# Patient Record
Sex: Male | Born: 1960 | Race: Black or African American | Hispanic: No | Smoking: Never smoker
Health system: Southern US, Community
[De-identification: ages and names within clinical notes are randomized; demographics above are authoritative.]

## PROBLEM LIST (undated history)

## (undated) DIAGNOSIS — R5383 Other fatigue: Secondary | ICD-10-CM

## (undated) DIAGNOSIS — F329 Major depressive disorder, single episode, unspecified: Secondary | ICD-10-CM

## (undated) DIAGNOSIS — I10 Essential (primary) hypertension: Secondary | ICD-10-CM

## (undated) DIAGNOSIS — H409 Unspecified glaucoma: Secondary | ICD-10-CM

## (undated) DIAGNOSIS — Z5189 Encounter for other specified aftercare: Secondary | ICD-10-CM

## (undated) DIAGNOSIS — K219 Gastro-esophageal reflux disease without esophagitis: Secondary | ICD-10-CM

## (undated) DIAGNOSIS — R519 Headache, unspecified: Secondary | ICD-10-CM

## (undated) DIAGNOSIS — F319 Bipolar disorder, unspecified: Secondary | ICD-10-CM

## (undated) DIAGNOSIS — M1990 Unspecified osteoarthritis, unspecified site: Secondary | ICD-10-CM

## (undated) DIAGNOSIS — T7840XA Allergy, unspecified, initial encounter: Secondary | ICD-10-CM

## (undated) DIAGNOSIS — D126 Benign neoplasm of colon, unspecified: Secondary | ICD-10-CM

## (undated) DIAGNOSIS — R631 Polydipsia: Secondary | ICD-10-CM

## (undated) DIAGNOSIS — R002 Palpitations: Secondary | ICD-10-CM

## (undated) DIAGNOSIS — R011 Cardiac murmur, unspecified: Secondary | ICD-10-CM

## (undated) DIAGNOSIS — R0602 Shortness of breath: Secondary | ICD-10-CM

## (undated) DIAGNOSIS — E069 Thyroiditis, unspecified: Secondary | ICD-10-CM

## (undated) DIAGNOSIS — E041 Nontoxic single thyroid nodule: Secondary | ICD-10-CM

## (undated) DIAGNOSIS — M79609 Pain in unspecified limb: Secondary | ICD-10-CM

## (undated) DIAGNOSIS — R490 Dysphonia: Secondary | ICD-10-CM

## (undated) DIAGNOSIS — K649 Unspecified hemorrhoids: Secondary | ICD-10-CM

## (undated) DIAGNOSIS — F419 Anxiety disorder, unspecified: Secondary | ICD-10-CM

## (undated) DIAGNOSIS — M542 Cervicalgia: Secondary | ICD-10-CM

## (undated) DIAGNOSIS — IMO0001 Reserved for inherently not codable concepts without codable children: Secondary | ICD-10-CM

## (undated) DIAGNOSIS — R5381 Other malaise: Secondary | ICD-10-CM

## (undated) DIAGNOSIS — R51 Headache: Secondary | ICD-10-CM

## (undated) DIAGNOSIS — R079 Chest pain, unspecified: Secondary | ICD-10-CM

## (undated) DIAGNOSIS — E7800 Pure hypercholesterolemia, unspecified: Secondary | ICD-10-CM

## (undated) HISTORY — DX: Other malaise: R53.81

## (undated) HISTORY — DX: Unspecified glaucoma: H40.9

## (undated) HISTORY — PX: BACK SURGERY: SHX140

## (undated) HISTORY — DX: Polydipsia: R63.1

## (undated) HISTORY — DX: Reserved for inherently not codable concepts without codable children: IMO0001

## (undated) HISTORY — DX: Shortness of breath: R06.02

## (undated) HISTORY — DX: Gastro-esophageal reflux disease without esophagitis: K21.9

## (undated) HISTORY — DX: Other fatigue: R53.83

## (undated) HISTORY — DX: Thyroiditis, unspecified: E06.9

## (undated) HISTORY — DX: Cardiac murmur, unspecified: R01.1

## (undated) HISTORY — DX: Nontoxic single thyroid nodule: E04.1

## (undated) HISTORY — DX: Essential (primary) hypertension: I10

## (undated) HISTORY — DX: Unspecified osteoarthritis, unspecified site: M19.90

## (undated) HISTORY — DX: Pain in unspecified limb: M79.609

## (undated) HISTORY — PX: EYE SURGERY: SHX253

## (undated) HISTORY — DX: Allergy, unspecified, initial encounter: T78.40XA

## (undated) HISTORY — DX: Anxiety disorder, unspecified: F41.9

## (undated) HISTORY — DX: Encounter for other specified aftercare: Z51.89

## (undated) HISTORY — DX: Benign neoplasm of colon, unspecified: D12.6

## (undated) HISTORY — DX: Bipolar disorder, unspecified: F31.9

## (undated) HISTORY — DX: Palpitations: R00.2

## (undated) HISTORY — PX: SPINE SURGERY: SHX786

## (undated) HISTORY — DX: Cervicalgia: M54.2

## (undated) HISTORY — DX: Pure hypercholesterolemia, unspecified: E78.00

## (undated) HISTORY — DX: Dysphonia: R49.0

## (undated) HISTORY — PX: FINGER SURGERY: SHX640

## (undated) HISTORY — DX: Unspecified hemorrhoids: K64.9

## (undated) HISTORY — DX: Chest pain, unspecified: R07.9

## (undated) HISTORY — DX: Major depressive disorder, single episode, unspecified: F32.9

---

## 1999-11-20 ENCOUNTER — Ambulatory Visit (HOSPITAL_BASED_OUTPATIENT_CLINIC_OR_DEPARTMENT_OTHER): Admission: RE | Admit: 1999-11-20 | Discharge: 1999-11-20 | Payer: Self-pay | Admitting: Orthopedic Surgery

## 2003-03-01 ENCOUNTER — Encounter: Payer: Self-pay | Admitting: Endocrinology

## 2003-03-01 ENCOUNTER — Ambulatory Visit (HOSPITAL_COMMUNITY): Admission: RE | Admit: 2003-03-01 | Discharge: 2003-03-01 | Payer: Self-pay | Admitting: Endocrinology

## 2003-10-02 ENCOUNTER — Ambulatory Visit (HOSPITAL_COMMUNITY): Admission: RE | Admit: 2003-10-02 | Discharge: 2003-10-02 | Payer: Self-pay | Admitting: Endocrinology

## 2003-10-13 ENCOUNTER — Observation Stay (HOSPITAL_COMMUNITY): Admission: RE | Admit: 2003-10-13 | Discharge: 2003-10-14 | Payer: Self-pay | Admitting: Specialist

## 2003-10-16 ENCOUNTER — Inpatient Hospital Stay (HOSPITAL_COMMUNITY): Admission: EM | Admit: 2003-10-16 | Discharge: 2003-10-20 | Payer: Self-pay | Admitting: Emergency Medicine

## 2004-07-20 ENCOUNTER — Emergency Department (HOSPITAL_COMMUNITY): Admission: EM | Admit: 2004-07-20 | Discharge: 2004-07-20 | Payer: Self-pay | Admitting: Emergency Medicine

## 2004-07-25 ENCOUNTER — Emergency Department (HOSPITAL_COMMUNITY): Admission: EM | Admit: 2004-07-25 | Discharge: 2004-07-25 | Payer: Self-pay | Admitting: Emergency Medicine

## 2004-10-03 ENCOUNTER — Emergency Department (HOSPITAL_COMMUNITY): Admission: EM | Admit: 2004-10-03 | Discharge: 2004-10-03 | Payer: Self-pay | Admitting: Emergency Medicine

## 2005-03-07 ENCOUNTER — Ambulatory Visit: Payer: Self-pay | Admitting: Family Medicine

## 2005-03-07 ENCOUNTER — Ambulatory Visit: Payer: Self-pay | Admitting: *Deleted

## 2005-03-28 ENCOUNTER — Inpatient Hospital Stay (HOSPITAL_COMMUNITY): Admission: RE | Admit: 2005-03-28 | Discharge: 2005-04-02 | Payer: Self-pay | Admitting: Orthopaedic Surgery

## 2005-06-05 ENCOUNTER — Ambulatory Visit: Payer: Self-pay | Admitting: Family Medicine

## 2005-06-12 ENCOUNTER — Ambulatory Visit: Payer: Self-pay | Admitting: Family Medicine

## 2006-09-28 ENCOUNTER — Emergency Department (HOSPITAL_COMMUNITY): Admission: EM | Admit: 2006-09-28 | Discharge: 2006-09-28 | Payer: Self-pay | Admitting: Emergency Medicine

## 2007-04-15 ENCOUNTER — Ambulatory Visit: Payer: Self-pay | Admitting: Endocrinology

## 2007-04-15 LAB — CONVERTED CEMR LAB: TSH: 1.78 microintl units/mL (ref 0.35–5.50)

## 2007-05-04 ENCOUNTER — Encounter: Admission: RE | Admit: 2007-05-04 | Discharge: 2007-05-04 | Payer: Self-pay | Admitting: Endocrinology

## 2007-05-18 ENCOUNTER — Encounter (INDEPENDENT_AMBULATORY_CARE_PROVIDER_SITE_OTHER): Payer: Self-pay | Admitting: Interventional Radiology

## 2007-05-18 ENCOUNTER — Encounter: Admission: RE | Admit: 2007-05-18 | Discharge: 2007-05-18 | Payer: Self-pay | Admitting: Endocrinology

## 2007-05-18 ENCOUNTER — Other Ambulatory Visit: Admission: RE | Admit: 2007-05-18 | Discharge: 2007-05-18 | Payer: Self-pay | Admitting: Interventional Radiology

## 2007-06-21 ENCOUNTER — Ambulatory Visit: Payer: Self-pay | Admitting: Endocrinology

## 2007-06-21 LAB — CONVERTED CEMR LAB: TSH: 2.28 microintl units/mL (ref 0.35–5.50)

## 2007-12-13 ENCOUNTER — Ambulatory Visit: Payer: Self-pay | Admitting: Endocrinology

## 2007-12-13 DIAGNOSIS — F319 Bipolar disorder, unspecified: Secondary | ICD-10-CM | POA: Insufficient documentation

## 2007-12-13 DIAGNOSIS — F3289 Other specified depressive episodes: Secondary | ICD-10-CM

## 2007-12-13 DIAGNOSIS — F329 Major depressive disorder, single episode, unspecified: Secondary | ICD-10-CM

## 2007-12-13 DIAGNOSIS — E069 Thyroiditis, unspecified: Secondary | ICD-10-CM

## 2007-12-13 HISTORY — DX: Bipolar disorder, unspecified: F31.9

## 2007-12-13 HISTORY — DX: Major depressive disorder, single episode, unspecified: F32.9

## 2007-12-13 HISTORY — DX: Other specified depressive episodes: F32.89

## 2007-12-13 HISTORY — DX: Thyroiditis, unspecified: E06.9

## 2007-12-14 LAB — CONVERTED CEMR LAB: TSH: 1.99 microintl units/mL (ref 0.35–5.50)

## 2008-01-24 ENCOUNTER — Ambulatory Visit: Payer: Self-pay | Admitting: Endocrinology

## 2008-01-24 DIAGNOSIS — R631 Polydipsia: Secondary | ICD-10-CM

## 2008-01-24 HISTORY — DX: Polydipsia: R63.1

## 2008-01-25 LAB — CONVERTED CEMR LAB
BUN: 10 mg/dL
CO2: 32 meq/L
Calcium: 9.3 mg/dL
Chloride: 105 meq/L
Creatinine, Ser: 1.1 mg/dL
GFR calc Af Amer: 94 mL/min
GFR calc non Af Amer: 77 mL/min
Glucose, Bld: 95 mg/dL
Potassium: 4.5 meq/L
Sodium: 142 meq/L
TSH: 2.55 u[IU]/mL

## 2008-01-31 ENCOUNTER — Encounter: Admission: RE | Admit: 2008-01-31 | Discharge: 2008-01-31 | Payer: Self-pay | Admitting: Endocrinology

## 2008-04-17 ENCOUNTER — Ambulatory Visit: Payer: Self-pay | Admitting: Endocrinology

## 2008-08-28 ENCOUNTER — Ambulatory Visit: Payer: Self-pay | Admitting: Endocrinology

## 2008-08-28 DIAGNOSIS — E041 Nontoxic single thyroid nodule: Secondary | ICD-10-CM | POA: Insufficient documentation

## 2008-08-28 HISTORY — DX: Nontoxic single thyroid nodule: E04.1

## 2008-09-18 ENCOUNTER — Encounter: Admission: RE | Admit: 2008-09-18 | Discharge: 2008-09-18 | Payer: Self-pay | Admitting: Endocrinology

## 2008-09-26 ENCOUNTER — Emergency Department (HOSPITAL_COMMUNITY): Admission: EM | Admit: 2008-09-26 | Discharge: 2008-09-26 | Payer: Self-pay | Admitting: Emergency Medicine

## 2008-12-18 ENCOUNTER — Ambulatory Visit: Payer: Self-pay | Admitting: Cardiovascular Disease

## 2008-12-31 ENCOUNTER — Emergency Department (HOSPITAL_COMMUNITY): Admission: EM | Admit: 2008-12-31 | Discharge: 2008-12-31 | Payer: Self-pay | Admitting: Emergency Medicine

## 2009-01-18 ENCOUNTER — Ambulatory Visit: Payer: Self-pay | Admitting: Endocrinology

## 2009-01-18 DIAGNOSIS — M542 Cervicalgia: Secondary | ICD-10-CM

## 2009-01-18 HISTORY — DX: Cervicalgia: M54.2

## 2009-01-18 LAB — CONVERTED CEMR LAB: Sed Rate: 6 mm/hr (ref 0–22)

## 2009-01-24 ENCOUNTER — Emergency Department (HOSPITAL_COMMUNITY): Admission: EM | Admit: 2009-01-24 | Discharge: 2009-01-24 | Payer: Self-pay | Admitting: Emergency Medicine

## 2009-01-24 ENCOUNTER — Ambulatory Visit: Payer: Self-pay | Admitting: Psychiatry

## 2009-01-24 ENCOUNTER — Inpatient Hospital Stay (HOSPITAL_COMMUNITY): Admission: EM | Admit: 2009-01-24 | Discharge: 2009-01-24 | Payer: Self-pay | Admitting: Psychiatry

## 2009-03-23 DIAGNOSIS — R0789 Other chest pain: Secondary | ICD-10-CM | POA: Insufficient documentation

## 2009-03-23 DIAGNOSIS — R5381 Other malaise: Secondary | ICD-10-CM

## 2009-03-23 DIAGNOSIS — R0602 Shortness of breath: Secondary | ICD-10-CM

## 2009-03-23 DIAGNOSIS — R079 Chest pain, unspecified: Secondary | ICD-10-CM

## 2009-03-23 DIAGNOSIS — R5383 Other fatigue: Secondary | ICD-10-CM

## 2009-03-23 DIAGNOSIS — R002 Palpitations: Secondary | ICD-10-CM

## 2009-03-23 HISTORY — DX: Other malaise: R53.81

## 2009-03-23 HISTORY — DX: Palpitations: R00.2

## 2009-03-23 HISTORY — DX: Chest pain, unspecified: R07.9

## 2009-03-23 HISTORY — DX: Shortness of breath: R06.02

## 2009-03-23 HISTORY — DX: Other fatigue: R53.83

## 2009-05-28 ENCOUNTER — Ambulatory Visit: Payer: Self-pay | Admitting: Endocrinology

## 2009-05-29 LAB — CONVERTED CEMR LAB
CO2: 33 meq/L — ABNORMAL HIGH (ref 19–32)
Calcium: 9 mg/dL (ref 8.4–10.5)
Creatinine, Ser: 1.1 mg/dL (ref 0.4–1.5)
GFR calc non Af Amer: 76.63 mL/min (ref >60)
Sodium: 143 meq/L (ref 135–145)
TSH: 2.19 microintl units/mL (ref 0.35–5.50)

## 2009-08-13 ENCOUNTER — Ambulatory Visit: Payer: Self-pay | Admitting: Endocrinology

## 2009-08-13 DIAGNOSIS — IMO0001 Reserved for inherently not codable concepts without codable children: Secondary | ICD-10-CM

## 2009-08-13 HISTORY — DX: Reserved for inherently not codable concepts without codable children: IMO0001

## 2009-08-14 LAB — CONVERTED CEMR LAB
AST: 30 units/L (ref 0–37)
Albumin: 4.5 g/dL (ref 3.5–5.2)
BUN: 16 mg/dL (ref 6–23)
Bilirubin Urine: NEGATIVE
Bilirubin, Direct: 0.1 mg/dL (ref 0.0–0.3)
Calcium: 8.9 mg/dL (ref 8.4–10.5)
Eosinophils Relative: 0.8 % (ref 0.0–5.0)
GFR calc non Af Amer: 69.25 mL/min (ref >60)
HCT: 47.1 % (ref 39.0–52.0)
HDL: 120.5 mg/dL (ref >39.00)
Ketones, ur: NEGATIVE mg/dL
Lymphocytes Relative: 39.4 % (ref 12.0–46.0)
MCHC: 33.2 g/dL (ref 30.0–36.0)
Neutro Abs: 2.5 10*3/uL (ref 1.4–7.7)
Nitrite: NEGATIVE
PSA: 2.23 ng/mL (ref 0.10–4.00)
Platelets: 290 10*3/uL (ref 150.0–400.0)
Potassium: 4.3 meq/L (ref 3.5–5.1)
Sed Rate: 10 mm/hr (ref 0–22)
Sodium: 140 meq/L (ref 135–145)
Total Bilirubin: 0.8 mg/dL (ref 0.3–1.2)
Total CHOL/HDL Ratio: 2
Total Protein, Urine: NEGATIVE mg/dL
Urine Glucose: NEGATIVE mg/dL
WBC: 4.8 10*3/uL (ref 4.5–10.5)
pH: 7 (ref 5.0–8.0)

## 2009-08-20 ENCOUNTER — Encounter: Admission: RE | Admit: 2009-08-20 | Discharge: 2009-08-20 | Payer: Self-pay | Admitting: Endocrinology

## 2009-09-03 ENCOUNTER — Ambulatory Visit: Payer: Self-pay | Admitting: Endocrinology

## 2009-09-03 DIAGNOSIS — H409 Unspecified glaucoma: Secondary | ICD-10-CM | POA: Insufficient documentation

## 2009-09-03 DIAGNOSIS — R490 Dysphonia: Secondary | ICD-10-CM

## 2009-09-03 DIAGNOSIS — K649 Unspecified hemorrhoids: Secondary | ICD-10-CM

## 2009-09-03 HISTORY — DX: Dysphonia: R49.0

## 2009-09-03 HISTORY — DX: Unspecified hemorrhoids: K64.9

## 2009-09-03 HISTORY — DX: Unspecified glaucoma: H40.9

## 2009-09-10 ENCOUNTER — Telehealth (INDEPENDENT_AMBULATORY_CARE_PROVIDER_SITE_OTHER): Payer: Self-pay | Admitting: *Deleted

## 2009-09-19 ENCOUNTER — Telehealth (INDEPENDENT_AMBULATORY_CARE_PROVIDER_SITE_OTHER): Payer: Self-pay | Admitting: *Deleted

## 2009-09-24 ENCOUNTER — Ambulatory Visit: Payer: Self-pay | Admitting: Cardiology

## 2009-09-24 ENCOUNTER — Ambulatory Visit: Payer: Self-pay

## 2009-09-24 ENCOUNTER — Encounter (HOSPITAL_COMMUNITY): Admission: RE | Admit: 2009-09-24 | Discharge: 2009-10-26 | Payer: Self-pay | Admitting: Endocrinology

## 2009-11-05 ENCOUNTER — Encounter: Payer: Self-pay | Admitting: Endocrinology

## 2009-11-19 ENCOUNTER — Telehealth: Payer: Self-pay | Admitting: Endocrinology

## 2009-11-20 ENCOUNTER — Ambulatory Visit: Payer: Self-pay | Admitting: Endocrinology

## 2009-11-20 DIAGNOSIS — M79609 Pain in unspecified limb: Secondary | ICD-10-CM

## 2009-11-20 HISTORY — DX: Pain in unspecified limb: M79.609

## 2009-12-03 ENCOUNTER — Encounter: Payer: Self-pay | Admitting: Endocrinology

## 2010-03-04 ENCOUNTER — Ambulatory Visit: Payer: Self-pay | Admitting: Endocrinology

## 2010-03-11 ENCOUNTER — Ambulatory Visit: Payer: Self-pay | Admitting: Endocrinology

## 2010-03-11 DIAGNOSIS — E7800 Pure hypercholesterolemia, unspecified: Secondary | ICD-10-CM | POA: Insufficient documentation

## 2010-03-11 HISTORY — DX: Pure hypercholesterolemia, unspecified: E78.00

## 2010-03-11 LAB — CONVERTED CEMR LAB
TSH: 1.77 microintl units/mL (ref 0.35–5.50)
Triglycerides: 85 mg/dL (ref 0.0–149.0)

## 2010-03-18 ENCOUNTER — Telehealth: Payer: Self-pay | Admitting: Endocrinology

## 2010-09-30 ENCOUNTER — Ambulatory Visit: Payer: Self-pay | Admitting: Endocrinology

## 2010-09-30 LAB — CONVERTED CEMR LAB
AST: 26 units/L (ref 0–37)
Cholesterol: 244 mg/dL — ABNORMAL HIGH (ref 0–200)
HDL: 97.9 mg/dL (ref >39.00)
Sed Rate: 10 mm/hr (ref 0–22)
TSH: 1.46 microintl units/mL (ref 0.35–5.50)
Total CK: 250 units/L — ABNORMAL HIGH (ref 7–232)
Total Protein: 6.7 g/dL (ref 6.0–8.3)
VLDL: 37.6 mg/dL (ref 0.0–40.0)

## 2010-10-22 ENCOUNTER — Encounter
Admission: RE | Admit: 2010-10-22 | Discharge: 2010-10-22 | Payer: Self-pay | Source: Home / Self Care | Attending: Endocrinology | Admitting: Endocrinology

## 2010-11-17 ENCOUNTER — Encounter: Payer: Self-pay | Admitting: Endocrinology

## 2010-11-26 NOTE — Consult Note (Signed)
Summary: [facility]  [facility]   Imported By: Lester [place] 12/11/2009 08:12:29  _____________________________________________________________________  External Attachment:    Type:   Image     Comment:   External Document

## 2010-11-26 NOTE — Assessment & Plan Note (Signed)
Summary: follow up-lb   Vital Signs:  Patient profile:   50 year old male Height:      65 inches (165.10 cm) Weight:      137.0 pounds (62.27 kg) O2 Sat:      97 % on Room air Temp:     97.1 degrees F (36.17 degrees C) oral Pulse rate:   78 / minute BP sitting:   118 / 80  (left arm) Cuff size:   regular  Vitals Entered By: Orlan Leavens (Mar 11, 2010 3:17 PM)  O2 Flow:  Room air CC: follow-up visit Is Patient Diabetic? No Pain Assessment Patient in pain? no        Primary Provider:  Caige Almeda  CC:  follow-up visit.  History of Present Illness: the status of at least 3 ongoing medical problems is addressed today: thyroid nodule:  pt says he does not notice it.  he denies difficulty swallowing or breathing. he says left arm pain persists after vaccine. he says loss of voice persists  Current Medications (verified): 1)  Lamictal 200 Mg Tabs (Lamotrigine) .... Take 1 By Mouth Qd 2)  Bupropion Hcl 150 Mg Xr12h-Tab (Bupropion Hcl) .... Take 3 Q Am 3)  Fluoxetine Hcl 20 Mg Caps (Fluoxetine Hcl) .Marland Kitchen.. 1 Qam  Allergies (verified): No Known Drug Allergies  Past History:  Past Medical History: Last updated: 03/23/2009 CHEST PAIN (ICD-786.50) PALPITATIONS (ICD-785.1) SHORTNESS OF BREATH (ICD-786.05) FATIGUE (ICD-780.79) NECK PAIN (ICD-723.1) THYROID NODULE (ICD-241.0) POLYDIPSIA (ICD-783.5) BIPOLAR DISORDER UNSPECIFIED (ICD-296.80) DEPRESSION (ICD-311) THYROIDITIS (ICD-245.9)  Review of Systems  The patient denies weight loss and weight gain.    Physical Exam  General:  normal appearance.   Neck:  Supple without thyroid enlargement or tenderness.  i cannot appreciate the thyroid nodule.  Neurologic:  normal voice   Impression & Recommendations:  Problem # 1:  THYROID NODULE (ICD-241.0) Assessment Unchanged  Problem # 2:  ARM PAIN, LEFT (ICD-729.5) uncertain etiology  Problem # 3:  HOARSENESS (WGN-562.13) uncertain etiology  Medications Added to  Medication List This Visit: 1)  Lamictal 200 Mg Tabs (Lamotrigine) .... Take 1 by mouth once daily 2)  Bupropion Hcl 150 Mg Xr12h-tab (Bupropion hcl) .... Take 3 tabs each am 3)  Fluoxetine Hcl 20 Mg Caps (Fluoxetine hcl) .Marland Kitchen.. 1 tab each am  Other Orders: Orthopedic Surgeon Referral (Ortho Surgeon) ENT Referral (ENT) TLB-Lipid Panel (80061-LIPID) TLB-TSH (Thyroid Stimulating Hormone) (84443-TSH) Est. Patient Level III (08657)  Patient Instructions: 1)  tests are being ordered for you today.  a few days after the test(s), please call [phone] to hear your test results. 2)  pending the test results, please consider cholesterol medication. labs to dr Evelene Croon 3)  (refer ortho and ent)

## 2010-11-26 NOTE — Progress Notes (Signed)
Summary: LT arm pain.  Phone Note Call from Patient Call back at Home Phone [phone]   Caller: Patient Summary of Call: pt called stating that he is still having pain in his LT arm from vaccinations OV 09/03/2009. OV? Initial call taken by: Margaret Pyle, CMA,  November 19, 2009 5:00 PM  Follow-up for Phone Call        please offer ov 11/20/09 Follow-up by: Minus Breeding MD,  November 19, 2009 6:14 PM  Additional Follow-up for Phone Call Additional follow up Details #1::        pt informed and was transferred to schedule appt today Additional Follow-up by: Margaret Pyle, CMA,  November 20, 2009 8:23 AM

## 2010-11-26 NOTE — Letter (Signed)
Summary: [facility]   Imported By: Lester [place] 11/26/2009 10:59:29  _____________________________________________________________________  External Attachment:    Type:   Image     Comment:   External Document

## 2010-11-26 NOTE — Letter (Signed)
Summary: Out of Work  [facility]  [address] [place], [place] [address]   Phone: [phone]  Fax: [phone]    November 20, 2009   Employee:  Raffi Gorczyca    To Whom It May Concern:   This employee was seen in our offcice this morning         Sincerely,    Minus Breeding MD

## 2010-11-26 NOTE — Assessment & Plan Note (Signed)
Summary: PER [facility] DOUBLEBOOK PER MD   STC   Vital Signs:  Patient profile:   50 year old male Height:      65 inches (165.10 cm) Weight:      147.13 pounds (66.88 kg) O2 Sat:      98 % on Room air Temp:     97.7 degrees F (36.50 degrees C) oral Pulse rate:   76 / minute BP sitting:   110 / 80  (left arm) Cuff size:   large  Vitals Entered By: Josph Macho CMA (November 20, 2009 10:33 AM)  O2 Flow:  Room air CC: Arm pain in left arm above the elbow/ pt states he received a shot there on 09/03/09/ CF Is Patient Diabetic? No   Primary Provider:  Hinton Luellen  CC:  Arm pain in left arm above the elbow/ pt states he received a shot there on 09/03/09/ CF.  History of Present Illness: pt states 3 mos of left arm pain, approx halfway between the shoulder and the elbow.  he says this resulted from the injected vaccine he had here nov 2010.  no local injury.    Current Medications (verified): 1)  Lamictal 200 Mg Tabs (Lamotrigine) .... Take 1 By Mouth Qd 2)  Lithium Carbonate 300 Mg Caps (Lithium Carbonate) .... Take 4 Alternating 5 At Bedtime 3)  Bupropion Hcl 150 Mg Xr12h-Tab (Bupropion Hcl) .... Take 3 Q Am 4)  Fluoxetine Hcl 20 Mg Caps (Fluoxetine Hcl) .Marland Kitchen.. 1 Qam  Allergies (verified): No Known Drug Allergies  Past History:  Past Medical History: Last updated: 03/23/2009 CHEST PAIN (ICD-786.50) PALPITATIONS (ICD-785.1) SHORTNESS OF BREATH (ICD-786.05) FATIGUE (ICD-780.79) NECK PAIN (ICD-723.1) THYROID NODULE (ICD-241.0) POLYDIPSIA (ICD-783.5) BIPOLAR DISORDER UNSPECIFIED (ICD-296.80) DEPRESSION (ICD-311) THYROIDITIS (ICD-245.9)  Review of Systems  The patient denies fever.    Physical Exam  General:  normal appearance.   Msk:  left upper arm: no tenderness/erythema/swelling/warmth--normal to my exam   Impression & Recommendations:  Problem # 1:  ARM PAIN, LEFT (ICD-729.5) not related to his injection  Other Orders: Orthopedic Surgeon Referral (Ortho  Surgeon) Est. Patient Level III (81191)  Patient Instructions: 1)  refer orthopedics

## 2010-11-26 NOTE — Progress Notes (Signed)
Summary: elevated chol  Phone Note Call from Patient Call back at Home Phone [phone]   Caller: Patient Summary of Call: pt called stating that he would like to try prescription medication for elevated cholesterol. Initial call taken by: Margaret Pyle, CMA,  Mar 18, 2010 11:55 AM  Follow-up for Phone Call        i sent rx.  please go to lab in 1 month for lipids 272.0, and liver v58.69. Follow-up by: Minus Breeding MD,  Mar 18, 2010 12:43 PM  Additional Follow-up for Phone Call Additional follow up Details #1::        pt informed of Rx, Labs scheduled for 06/20. Pt aware Additional Follow-up by: Margaret Pyle, CMA,  Mar 18, 2010 1:46 PM    New/Updated Medications: PRAVASTATIN SODIUM 40 MG TABS (PRAVASTATIN SODIUM) 1 tab at bedtime Prescriptions: PRAVASTATIN SODIUM 40 MG TABS (PRAVASTATIN SODIUM) 1 tab at bedtime  #30 x 11   Entered and Authorized by:   Minus Breeding MD   Signed by:   Minus Breeding MD on 03/18/2010   Method used:   Electronically to        [facility]* (retail)       [address] [place], [place]  [address]       Ph: [phone]       Fax: [phone]   RxID:   [id number]

## 2010-11-28 NOTE — Assessment & Plan Note (Signed)
Summary: f/u on thyroid/#/cd   Vital Signs:  Patient profile:   50 year old male Height:      65 inches (165.10 cm) Weight:      139 pounds (63.18 kg) BMI:     23.21 O2 Sat:      99 % on Room air Temp:     98.5 degrees F (36.94 degrees C) oral Pulse rate:   92 / minute BP sitting:   114 / 80  (left arm) Cuff size:   regular  Vitals Entered By: Brenton Grills CMA Duncan Dull) (September 30, 2010 1:40 PM)  O2 Flow:  Room air CC: F/U on thyroid/pt is no longer taking Lamictal, Fluoxetine, or Pravastatin Sodium/aj Is Patient Diabetic? No   Primary Provider:  ellison  CC:  F/U on thyroid/pt is no longer taking Lamictal, Fluoxetine, and or Pravastatin Sodium/aj.  History of Present Illness: thyroid nodule:  pt states he can notice it.  it is not painful. he has 1 year of intemittent moderate myalgias, worst at the legs.  no assoc numbness. he has stopped pravastatin.    Current Medications (verified): 1)  Lamictal 200 Mg Tabs (Lamotrigine) .... Take 1 By Mouth Once Daily 2)  Bupropion Hcl 150 Mg Xr12h-Tab (Bupropion Hcl) .... Take 3 Tabs Each Am 3)  Fluoxetine Hcl 20 Mg Caps (Fluoxetine Hcl) .Marland Kitchen.. 1 Tab Each Am 4)  Pravastatin Sodium 40 Mg Tabs (Pravastatin Sodium) .Marland Kitchen.. 1 Tab At Bedtime 5)  Venlafaxine Hcl 150 Mg Xr24h-Tab (Venlafaxine Hcl) .Marland Kitchen.. 1 Tablet By Mouth Once Daily 6)  Multivitamins  Tabs (Multiple Vitamin) .Marland Kitchen.. 1 Tablet By Mouth Once Daily  Allergies (verified): No Known Drug Allergies  Past History:  Past Medical History: Last updated: 03/23/2009 CHEST PAIN (ICD-786.50) PALPITATIONS (ICD-785.1) SHORTNESS OF BREATH (ICD-786.05) FATIGUE (ICD-780.79) NECK PAIN (ICD-723.1) THYROID NODULE (ICD-241.0) POLYDIPSIA (ICD-783.5) BIPOLAR DISORDER UNSPECIFIED (ICD-296.80) DEPRESSION (ICD-311) THYROIDITIS (ICD-245.9)  Social History: Reviewed history from 05/28/2009 and no changes required.   He lives with his wife. No tobacco, alcohol or drugs.  He  works as a Programmer, multimedia, Librarian, academic. originally from [place].  Review of Systems  The patient denies weight loss and weight gain.    Physical Exam  General:  normal appearance.   Neck:  there is a 2 cm right thyroid nodule.   Cervical Nodes:  No significant adenopathy.  Additional Exam:  FastTSH         1.46 uIU/mL   Cholesterol LDL    120.4 mg/dL Creatine Kinase      [H]  250 U/L                     7-232 Sed Rate                  10 mm/hr        Impression & Recommendations:  Problem # 1:  MYALGIA (ICD-729.1) exceedingly unlikely related to pravachol.  Problem # 2:  THYROID NODULE (ICD-241.0) Assessment: Unchanged  Problem # 3:  HYPERCHOLESTEROLEMIA (ICD-272.0) needs increased rx  Medications Added to Medication List This Visit: 1)  Venlafaxine Hcl 150 Mg Xr24h-tab (Venlafaxine hcl) .Marland Kitchen.. 1 tablet by mouth once daily 2)  Multivitamins Tabs (Multiple vitamin) .Marland Kitchen.. 1 tablet by mouth once daily  Other Orders: TLB-TSH (Thyroid Stimulating Hormone) (84443-TSH) TLB-Lipid Panel (80061-LIPID) TLB-CK Total Only(Creatine Kinase/CPK) (82550-CK) TLB-Sedimentation Rate (ESR) (85652-ESR) TLB-Hepatic/Liver Function Pnl (80076-HEPATIC) Neurology Referral (Neuro) Radiology Referral (Radiology) Est. Patient Level IV ([address])  Patient Instructions:  1)  recheck thyroid ultrasound.  you will be called with a day and time for an appointment. 2)  blood tests are also being ordered for you today.  please call [phone] to hear each of your test results. 3)  Please schedule a follow-up appointment in 1 year. 4)  check test to analyze the leg muscles.  you will be called with a day and time for an appointment. 5)  (update: i left message on phone-tree:  you should resume pravachol).    Orders Added: 1)  TLB-TSH (Thyroid Stimulating Hormone) [84443-TSH] 2)  TLB-Lipid Panel [80061-LIPID] 3)  TLB-CK Total Only(Creatine Kinase/CPK) [82550-CK] 4)  TLB-Sedimentation Rate (ESR) [85652-ESR] 5)   TLB-Hepatic/Liver Function Pnl [80076-HEPATIC] 6)  Neurology Referral [Neuro] 7)  Radiology Referral [Radiology] 8)  Est. Patient Level IV [40347]

## 2011-02-06 LAB — URINALYSIS, ROUTINE W REFLEX MICROSCOPIC
Glucose, UA: NEGATIVE mg/dL
Hgb urine dipstick: NEGATIVE
Ketones, ur: NEGATIVE mg/dL
Nitrite: NEGATIVE
Protein, ur: NEGATIVE mg/dL
Specific Gravity, Urine: 1.012 (ref 1.005–1.030)
Urobilinogen, UA: 0.2 mg/dL (ref 0.0–1.0)
pH: 7 (ref 5.0–8.0)

## 2011-02-06 LAB — CBC
HCT: 46.3 % (ref 39.0–52.0)
Hemoglobin: 15.6 g/dL (ref 13.0–17.0)
WBC: 4 10*3/uL (ref 4.0–10.5)

## 2011-02-06 LAB — COMPREHENSIVE METABOLIC PANEL
ALT: 38 U/L (ref 0–53)
CO2: 30 mEq/L (ref 19–32)
Calcium: 9.8 mg/dL (ref 8.4–10.5)
Chloride: 102 mEq/L (ref 96–112)
Glucose, Bld: 92 mg/dL (ref 70–99)
Potassium: 3.8 mEq/L (ref 3.5–5.1)
Sodium: 139 mEq/L (ref 135–145)
Total Bilirubin: 1 mg/dL (ref 0.3–1.2)
Total Protein: 7.2 g/dL (ref 6.0–8.3)

## 2011-02-06 LAB — POCT I-STAT, CHEM 8
BUN: 12 mg/dL (ref 6–23)
Calcium, Ion: 1.18 mmol/L (ref 1.12–1.32)
Chloride: 105 mEq/L (ref 96–112)
Glucose, Bld: 91 mg/dL (ref 70–99)
Potassium: 3.7 mEq/L (ref 3.5–5.1)

## 2011-02-06 LAB — DIFFERENTIAL
Basophils Relative: 0 % (ref 0–1)
Lymphocytes Relative: 34 % (ref 12–46)
Monocytes Relative: 7 % (ref 3–12)

## 2011-02-06 LAB — RAPID URINE DRUG SCREEN, HOSP PERFORMED
Barbiturates: NOT DETECTED
Cocaine: NOT DETECTED
Tetrahydrocannabinol: NOT DETECTED

## 2011-02-06 LAB — URINE MICROSCOPIC-ADD ON

## 2011-02-06 LAB — LITHIUM LEVEL: Lithium Lvl: 1.24 mEq/L (ref 0.80–1.40)

## 2011-03-11 NOTE — Consult Note (Signed)
[facility]                          ENDOCRINOLOGY CONSULTATION   NAME:[name], [name]                        MRN:          [id number]  DATE:04/15/2007                            DOB:          1961/06/08    REFERRING PHYSICIAN:  Rene Kocher, M.D.   REASON FOR REFERRAL:  Thyroid nodule.   HISTORY OF PRESENT ILLNESS:  A 50 year old man who was last seen by me  in 2004. He was recently found on an MRI of the neck to have a question  of right-sided thyroid nodule. He does not notice this, but he has  several months of moderate pain at the neck and some associated fatigue.   PAST MEDICAL HISTORY:  1. Depression.  2. He had viral thyroiditis in 2004.   SOCIAL HISTORY:  He is single. He does not smoke or drink.   FAMILY HISTORY:  Negative for thyroid disease.   REVIEW OF SYSTEMS:  Denies difficulty swallowing or breathing.   PHYSICAL EXAMINATION:  VITAL SIGNS:  Blood pressure 142/90, heart rate  is 83, temperature is 97.2, the weight is 141.  GENERAL:  No distress.  SKIN:  No rash, not diaphoretic.  HEENT:  No proptosis, no periorbital swelling.  NECK:  I do not appreciate a thyroid nodule. He has no swelling or  tenderness at the neck. He has full range of motion at the neck but this  is slightly painful in general.  CHEST:  Clear to auscultation. No respiratory distress.  CARDIOVASCULAR:  No edema. Regular rate and rhythm, no murmur. Pedal  pulses are intact.  NEUROLOGIC:  Alert and oriented, does not appear anxious nor depressed  and there is no tremor.   LABORATORY DATA:  On April 15, 2007, TSH 1.78. MRI is also reviewed.   IMPRESSION:  1. Right-sided thyroid nodule suggested by MRI.  2. Neck pain which I agree with Dr. Clarisse Gouge is not thyroid related.  3. Symptoms of fatigue which in view of a normal TSH I also agree with      Dr. Clarisse Gouge is not thyroid related.   PLAN:  1. Thyroid ultrasound.  2. He may need an ultrasound-guided biopsy of  this nodule and I will      get back to the patient when the ultrasound result is known.     Sean A. Everardo All, MD  Electronically Signed    SAE/MedQ  DD: 04/18/2007  DT: 04/19/2007  Job #: [id number]   cc:   Rene Kocher, M.D.

## 2011-03-11 NOTE — Assessment & Plan Note (Signed)
[facility]                            CARDIOLOGY OFFICE NOTE   NAME:[name], [name]                        MRN:          [id number]  DATE:12/18/2008                            DOB:          [birth year]    [name] is seen today at the request if Dr. Everardo All.  He has had some  atypical symptoms in January, had some atypical chest pain and  dizziness.  He complains of fatigue when walking and some exertional  shortness of breath.  He sometimes feels tremulous and has palpitations.  They sound benign.  He has not had rapid succession of beats or  presyncope or diaphoresis with this.   He apparently was seen in the ER in January for atypical chest pain and  ruled out and was told to follow up with his primary care MD who  referred him here.   The patient has bipolar disease.  He is on a slew of centrally-acting  drugs.  He also has thyroiditis since being treated for this.   He has no documented previous cardiac problem.  I do not see that there  has ever been a treadmill or ultrasound study done on the patient.   He seems to have a little bit of difficulty in processing information  when I talk to him.   His review of systems is otherwise negative.  In particular, he has not  had lower extremity edema, history of cardiomyopathy, or cardiac valve  disease.  No history of DVT or PE.  No history of syncope or arrhythmia.   His past medical history is remarkable for thyroiditis, history of  bipolar disease.   He is on etodolac 500 mg b.i.d. as well as Lamictal 100 mg tabs 1 daily,  cyclobenzaprine 10 mg tabs 3 times a day, Remeron 15 mg at bedtime,  topiramate 50 mg at bedtime.  I believe he is also on lithium 300 mg.   The patient is originally from [place].  He has a family in [place].  He is here by myself.  Again, his history is difficult to take.  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  He does not smoke or drink.  His bipolar disease  appear significant,   Family history is noncontributory.   PHYSICAL EXAMINATION:  GENERAL:  Remarkable for a black male in no  distress.  Speech is a bit halting.  He appears to have a bit of a  twitch in the right eye and right side of his face.  He is missing the  left thumbnail.  VITAL SIGNS:  His blood pressure is 128/70, pulse 75 and regular,  respiratory rate 14 and afebrile.  HEENT:  Unremarkable.  NECK:  Carotids normal without bruit.  No lymphadenopathy, thyromegaly,  or JVP elevation.  LUNGS:  Clear.  Good diaphragmatic motion.  No wheezing.  HEART:  S1 and S2 with normal heart sounds.  PMI normal.  ABDOMEN:  Benign.  Bowel sounds positive.  No AAA.  No tenderness.  No  bruit.  No hepatosplenomegaly, hepatojugular reflux, or tenderness.  EXTREMITIES:  Distal pulses are intact.  No edema.  NEUROLOGIC:  Nonfocal.  SKIN:  Warm and dry.  MUSCULOSKELETAL:  No muscular weakness.  He appears to have had previous  back surgery with a lower lumbar scar.   IMPRESSION:  1. Multiple atypical symptoms regarding fatigue, shortness of breath,      palpitations, and atypical chest pain.  Normal exam and normal EKG.      Followup exercise stress echo.  Low likelihood of cardiac problem      here.  2. Thyroiditis.  Followup with Dr. Everardo All.  Followup TSH and T4.      Continue topiramate, cyclobenzaprine, Remeron.  3. Bipolar disease.  Continue lithium.  Followup with Dr. Tomasa Rand.  4. Previous back surgery.  P.r.n. nonsteroidals.   The patient will be seen on an as-needed basis so long as his stress  echo looks normal.  His EKG today was essentially normal with borderline  voltage criteria for LVH in the lateral leads.     [name]. Eden Emms, MD, [facility]  Electronically Signed    PCN/MedQ  DD: 12/18/2008  DT: 12/18/2008  Job #: [id number]

## 2011-03-11 NOTE — Discharge Summary (Signed)
[name], [name]               ACCOUNT NO.:  [id number]   MEDICAL RECORD NO.:  [id number]          PATIENT TYPE:  IPS   LOCATION:  0507                          FACILITY:  [facility]   PHYSICIAN:  Geoffery Lyons, M.D.      DATE OF BIRTH:  07-28-1961   DATE OF ADMISSION:  01/24/2009  DATE OF DISCHARGE:  01/24/2009                               DISCHARGE SUMMARY   IDENTIFYING INFORMATION:  This was a 50 year old male.  This was a  voluntary admission.   HISTORY OF PRESENT ILLNESS:  [name] presented voluntarily to our  service after coming to the emergency room, where he presented just  feeling down and depressed and having multiple problems at home.  He  reported he had a lot of medical bills to pay and had no money to pay  them; felt very sad, felt that he needed help with some counseling.  But, is denying any actual suicidal thoughts.  He is separated from his  wife, who still lives in [place].  He was feeling very pressured at work,  felt that people were trying to get him fired.  He felt that he could  not keep up the pace.  Currently followed as an outpatient by Dr.  Tomasa Rand here in [place] for bipolar disorder.  He reports he is  compliant with medications.  Sometimes felt when he was depressed he  might be hearing some voices or seeing people, but no clear  hallucinations and he has been functioning quite well.  Apparently he  was referred because of possible hallucinations, but he denies any  dangerous thoughts or any significant hallucinations that interfere with  his functioning.  He was requesting discharge and was found to have no  significant thought disorder or dangerous ideas.   DISCHARGE MENTAL STATUS EXAM:  Revealed a fully alert male, coherent,  cooperative; asking to be discharged and wanted to go ahead and pursue  his work and followup with his own psychiatrist.  No delusional  statements made, in full contact with reality.  No suicidal or homicidal  thoughts.   Cognition fully intact.  No evidence of internal distractions  or hallucinations while he was here.   DISCHARGE DIAGNOSES:  AXIS I:  Bipolar disorder by history.  AXIS II:  No diagnosis.  AXIS III:  No diagnosis.  AXIS IV:  Moderate to severe chronic financial stress, and stress with  long distance marital relationship.  AXIS V: Current 65, past year 78.   DISCHARGE MEDICATIONS:  These are unchanged, includes:  1. Lamictal 200 mg q.h.s.  2. Lithium carbonate 300 mg four capsules at bedtime alternating with      five capsules at bedtime every other day.  3. Wellbutrin XL 150 mg 3 tablets every morning.  4. Ambien 10 mg q.h.s.   He is scheduled followup on January 24, 2009 at 3:00 p.m. with his regular  psychiatrist, Dr. Tomasa Rand.      Claris Che A. Scott, N.P.      Geoffery Lyons, M.D.  Electronically Signed    MAS/MEDQ  D:  01/24/2009  T:  01/24/2009  Job:  [id number]

## 2011-03-14 NOTE — Op Note (Signed)
[name], [name]                           ACCOUNT NO.:  [id number]   MEDICAL RECORD NO.:  [id number]                   PATIENT TYPE:  OBV   LOCATION:  0460                                 FACILITY:  [facility]   PHYSICIAN:  Jene Every, M.D.                 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   PROCEDURE:  Bilateral hemilaminotomy, microdiskectomy, lateral recess  decompression.   BRIEF HISTORY:  A 50 year old with bilateral lower extremity radicular pain  and central disk herniation.  Actually paracentral to the right, the patient  had predominantly  left lower extremity radicular symptoms.  Positive neural  tension signs bilaterally.  Operative intervention was indicated for  decompression, bilateral hemilaminotomies, evaluation of the L5 nerve root  which was bilateral with symptomatology. The risks were discussed including  bleeding, infection, injury to neurovascular structures, CSF leakage,  epidural fibrosis, adjacent segment disease, need for fusion, etc.   TECHNIQUE:  The patient in the supine position, after an adequate level of  general anesthesia, 1 g of Kefzol was placed prone on the Echo frame, all  bony prominences well padded. The lumbar region was prepped and draped in  the usual sterile fashion. An 18 gauge spinal needle was utilized to  localize the 4-5 interspace, confirmed with an x-ray. An incision was made  from the spinous process of 4-5, subcutaneous tissue dissected,  electrocautery was utilized to achieve hemostasis.  The dorsal lithotomy  position was identified and divided in line with the skin incision.  The  paraspinous muscle was elevated from the lamina of 4 and 5.  A second  confirmatory radiograph obtained with the [facility] retractor  placed.  Hemilaminotomy of the caudad edge of the left L4 hemilaminotomy was  performed with a 2 and a 3 mm Kerrison.  The ligament of flavum was attached  to the cephalad edge of 5.  A 2 mm Kerrison was utilized to perform the  foraminotomy at 5 and there was a very small intralaminar space.  A  significant amount of recessed stenosis was noted at 4 and 5 on the left.  I  performed the foraminotomy, decompression of the lateral recess and found a  focal HNP compressing the 5 root with the neurolamina gently mobilized  medially.  We performed the annulotomy and performed diskectomy  multiple  passes with a Delsa Grana and a micropituitary.  Then in a similar  fashion, I entered the contralateral side of 4-5, protected the nerve root,  performed the annulotomy due to a significant compression over here as well  in the right.  The subannular large disk fragment was mobilized with a nerve  hook and a pituitary and removed in its entirety.  I then placed a hockey  stick probe placed in the foramen of 4 and they were found to be widely  patent. Bipolar electrocautery was utilized to achieve hemostasis.  Bone wax  bilaterally was placed on the cancellous surfaces.  The disk space was  copiously irrigated with antibiotic irrigation.  I checked bilaterally again  on the left and there was no evidence of residual disk herniation or neural  compression. No evidence of CSF leakage or active bleeding.  I placed  thrombin soaked Gelfoam in the laminotomy defects bilaterally, removed the  McCullough retractor, paraspinous muscles inspected with no evidence of  active bleeding.  The dorsolumbar fascia  was reapproximated with #1 Vicryl interrupted figure-of-eight sutures,  subcutaneous tissue reapproximated with 2-0 Vicryl simple sutures. The skin  was reapproximated with 4-0 subcuticular Prolene.  The wound was reinforced  with Steri-Strips, sterile dressing applied, placed supine on a hospital   bed, extubated without difficulty and transported to the recovery room in  satisfactory condition.   The patient tolerated the procedure well with no complications. Blood loss  20 mL.   ASSISTANT:  Roma Schanz, P.A.                                               Jene Every, M.D.    Cordelia Pen  D:  10/13/2003  T:  10/14/2003  Job:  [id number]

## 2011-03-14 NOTE — Op Note (Signed)
[facility]  Patient:    [name]                         MRN: [id number] Proc. Date: 11/20/99 Attending:  Nicki Reaper, M.D. Dictator:   Nicki Reaper, M.D. CC:         two copies to office                           Operative Report  PREOPERATIVE DIAGNOSIS:  Boutonniere deformity, left little finger.  POSTOPERATIVE DIAGNOSIS:  Boutonniere deformity, left little finger.  PROCEDURE:  Reconstruction of extensor tendon, extensor hood, left little finger.  SURGEON:  Dr. Merlyn Lot.  ASSISTANT:  ______.  ANESTHESIA:  Forearm based intravenous regional.  ANESTHESIOLOGIST:  Edwin Cap. Zoila Shutter, M.D.  HISTORY OF PRESENT ILLNESS:  The patient is a 50 year old male who suffered a laceration over the PIP joint, left little finger.  He has been left with a boutonniere deformity.  DESCRIPTION OF PROCEDURE:  The patient was brought to the operating room where  forearm based intravenous regional anesthetic was carried out without difficulty. He was prepped and draped using Betadine scrubbing solution with left arm free. A curvilinear incision was made over the PIP joint, carried down through the subcutaneous tissue, bleeders were electrocauterized.  The mass present on the dorsal radial aspect was the partial tear of the tendon and scar.  This was not  excised, it was partially debrided.  It was decided to proceed with reconstructing using portions of the lateral bands over the dorsum of the finger, and secured nto position dorsally.  These incisions were made in the lateral bands, thus freeing the retinacular fibers allowing the band to be pulled dorsally.  Approximately /2 of each lateral band was then mobilized leaving it attached proximally and distally.  This was then swung dorsally and sutured to itself along with the central slip on the dorsal aspect with figure-of-eight 5-0 Mersilene sutures. he finger was held in a fully straight  position.  Flexion of the distal phalanx was easily possible, as was flexion of the PIP joint to full fist.  The wound was irrigated.  The skin was closed with interrupted 5-0 Nylon sutures, sterile compressive dressing, and splint was applied.  The patient tolerated the procedure well, and was taken to the recovery room for observation in satisfactory condition.  FOLLOWUP:  He is discharged home to return to the [facility] [place] in ne week.  DISCHARGE MEDICATIONS: 1. Vicodin. 2. Keflex. DD:  11/20/99 TD:  11/20/99 Job: [id number]

## 2011-03-14 NOTE — Discharge Summary (Signed)
[name], [name]               ACCOUNT NO.:  [id number]   MEDICAL RECORD NO.:  [id number]          PATIENT TYPE:  INP   LOCATION:  5041                         FACILITY:  [facility]   PHYSICIAN:  Mark C. Ophelia Charter, M.D.    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   ADDITIONAL DIAGNOSES:  1.  Postoperative ileus.  2.  History of gastritis.  3.  Anemia secondary to intraoperative blood loss.   HISTORY OF PRESENT ILLNESS:  This 50 year old male has had previous lumbar  surgery times two in December 2004 with recurrent problems and is also  followed by Dr. Jodi Marble for depression. MRI demonstrated a recurrent free  fragment with compression and the patient was experiencing significant,  persistent pain. The patient had an on the job injury on June 12, 2003.  MRI in October 2005 demonstrated large disk protrusion with severe  compression and central protrusion, and another level small L1-2, L2-3, and  small disk bulge at L4-5 without compression. He was admitted for L4-5  decompression and T-lift interbody fusion.   Admission labs were unremarkable. Hemoglobin was 15.  The patient was  admitted and underwent L4-5 micro diskectomy for recurrent HNP, bilateral  foraminotomies. Right T-lift procedure left lateral gutter and transverse  process fusion with iliac crest bone graft and local bone pedicle  instrumentation with Dr. Annell Greening and with Dr. Allie Bossier assisting.  Seven hundred Cell Saver was re-transfused. Estimated blood loss 1400 cc.  One Hemovac drain. He was seen by the internal medicine hospitalist  postoperatively with some intermittent chest pain, shortness of breath, and  some ileus with abdominal bloating and gradually improved with follow-up  serial KUB.  OT and PT worked with patient. The  patient had a rectal tube  that was discontinued on March 31, 2005. CPK was elevated as typical for a  lumbar fusion.  Enzymes were consistent with noncardiac origin. Narcotics  were adjusted and hemoglobin was 7.6.  He received two units of packed  cells. He was ambulatory and was discharged on April 02, 2005. The patient had  spiral CT which was negative for any evidence of PE. ABG showed PO2 of 122.  He was discharged on Tylox and one iron tablet daily 300 mg. Hemoglobin 10.7  and stable at discharge. Also office followup one week postoperatively.       MCY/MEDQ  D:  06/01/2005  T:  06/01/2005  Job:  [id number]   cc:   Renato Battles, M.D.

## 2011-03-14 NOTE — Consult Note (Signed)
NAMEJOMAR, [name]               ACCOUNT NO.:  [id number]   MEDICAL RECORD NO.:  [id number]          PATIENT TYPE:  INP   LOCATION:  5041                         FACILITY:  [facility]   PHYSICIAN:  Renato Battles, M.D.     DATE OF BIRTH:  [birth year]   DATE OF CONSULTATION:  03/29/2005  DATE OF DISCHARGE:                                   CONSULTATION   REASON FOR CONSULTATION:  Shortness of breath and chest pain.   HISTORY OF PRESENT ILLNESS:  The patient is a very pleasant 50 year old  African American male, status post extensive back surgery, for multiple  levels of dyskinesia and instability.  The patient reported having gradual  onset of shortness of breath and mid chest pressure.  It feels like trapped  gas radiating to the throat and stopping there. The patient describes an  inability to pass gas and inability to burp.  This chest pressure happened  at rest.  Is not related to activity.  No palpitations.  No sweating.  No  nausea or vomiting.  EKG during the event was negative.  The patient also  reported having trouble passing gas and moving his bowels from the lower end  and feeling bloated and abdominal pressure as well.  Last bowel movement was  two days ago.   REVIEW OF SYSTEMS:  CONSTITUTIONAL:  No fevers, chills or night sweats.  No  weight changes.  GI:  Positive for occasion nausea.  No vomiting, positive  for constipation and abdominal bloating.  CARDIOPULMONARY:  No cough,  positive for shortness of breath, positive for chest pressure as above.  No  PND, no orthopnea.  GU:  No dysuria, hematuria, or retention.   PAST MEDICAL HISTORY:  1.  Back injury in 2004.  It happened while the patient was lifting heavy      weights.  2.  H pylori infection, being treated as an outpatient.  3.  History of hypothyroidism.  The patient said the last time it was      checked was 2004 and he was told it was okay then.  He is not taking any      treatment for it.   PAST SURGICAL  HISTORY:  1.  Back surgery x3.  2.  Finger surgery.   FAMILY HISTORY:  Positive for hypertension.   ALLERGIES:  No known drug allergies.   SOCIAL HISTORY:  He lives with his wife. No tobacco, alcohol or drugs.  He  works as a IT trainer, Librarian, academic.   HOME MEDICATIONS:  Protonix, amoxicillin and Flagyl.   HOSPITAL MEDICATIONS:  Amoxicillin, Colace, Percocet, Reglan, Metoprolol,  morphine PCA, oxycodone.   PHYSICAL EXAMINATION:  GENERAL:  The patient is alert and oriented x3 in no  acute distress.  VITAL SIGNS:  Temperature 98.9, heart rate 106, respiratory rate 124, blood  pressure 131/83, O2 saturation 100% on room air.  HEENT:  The head is atraumatic and normocephalic.  Pupils were equal, round  and reactive to light and accommodation.  NECK:  No lymphadenopathy, no thyromegaly, no JVD.  CHEST:  Clear to  auscultation bilaterally.  No wheezing.  HEART:  Regular rhythm.  No murmurs.  ABDOMEN:  Soft, nontender, significant distention.  Decreased bowel sounds.  EXTREMITIES:  No cyanosis, edema or clubbing.   LABORATORY DATA:  CBC postoperatively showed white count of 7.3, hemoglobin  9.9, hematocrit 29.1, platelets 230.  CBC on May 30 showing hemoglobin of  15.  Other preoperative studies included CMP which was essentially normal  except for minimally elevated AST and 60 and ALT at 77.  The EKG obtained  during the chest pressure episode was negative and KUB was ordered and is  pending.   ASSESSMENT:  1.  Ileus and bloating, post surgical probably related to narcotics.  2.  Tachycardia of unknown origin.  3.  Postoperative anemia.  The patient had hemoglobin drop from 15 on May 30      to 9 yesterday.  4.  Helicobacter pylori infection.  5.  Chest pain, unlikely cardiac or pulmonary embolus.  Probably related to      bloating.   PLAN:  1.  Start stimulant laxative and stool softener.  2.  Agree with Reglan as scheduled.  3.  Change H pylori treatment  to a standardized Pred-Pak with amoxicillin,      erythromycin and Prevacid.  4.  Bolus of IV fluid and increase basal rate to restore blood volume.  5.  Check cardiac enzymes and TSH level.       SA/MEDQ  D:  03/29/2005  T:  03/31/2005  Job:  [id number]   cc:   Veverly Fells. Ophelia Charter, M.D.  [address] [place], [place] [address]  Fax: [phone]   [facility]

## 2011-03-14 NOTE — Discharge Summary (Signed)
[name], [name]                           ACCOUNT NO.:  [id number]   MEDICAL RECORD NO.:  [id number]                   PATIENT TYPE:  INP   LOCATION:                                       FACILITY:  [facility]   PHYSICIAN:  Jene Every, M.D.                 DATE OF BIRTH:  1961-04-30   DATE OF ADMISSION:  10/16/2003  DATE OF DISCHARGE:  10/20/2003                                 DISCHARGE SUMMARY   ADMISSION DIAGNOSES:  1. Recurrent herniated nucleus pulposus.  2. Urinary hesitancy.   DISCHARGE DIAGNOSES:  1. Recurrent herniated nucleus pulposus, status post redo microdiskectomy.  2. Resolved urinary hesitation.   HISTORY:  Mr. __________ is a 50 year old gentleman, who initially underwent  decompression and microdiskectomy at the L4-5 level.  The patient was  discharged home and had reoccurrence of his symptoms 1 day following  discharge.  He then presented to our office with acute onset of severe lower  extremity pain and complaints of urinary retention.  A repeat MRI scan was  done which shows a large recurrent disk rupture at the L4-5 level.  At that  point, the patient was admitted to the hospital for repeat microdiskectomy  as well as urology evaluation.   PROCEDURE NOTE:  The patient underwent a redo microdiskectomy at the L4-5  level on the right.  Surgeon Dr. Jene Every and assistant Dr. Simonne Come.  Anesthesia general.  Complications none.   CONSULTS:  1. PT/OT.  2. Urology.   ADMISSION LABS:  Hemoglobin 12.9, hematocrit 38.0.  Coagulation studies done  preoperatively showed PT of 13.0, INR 1.0.  Routine chemistries done  preoperatively show a sodium 133, potassium 4.4, glucose 97.  Cardiac  markers show a slightly elevated CK at 645, CK-MB 5.7, troponin less than  0.01.  A urinalysis done shows cloudy urine, otherwise negative.  Urine  culture showed no growth.  EKG showed normal sinus rhythm.  Chest x-ray as  well as CT showed no evidence of pulmonary embolus  or early acute process.  There was a question on chest x-ray that showed a questionable right lower  lobe atelectasis versus infiltrate.  Again, an MRI of the lumbar spine  showed moderately large central disk herniation at the L4-5 level.   HOSPITAL COURSE:  The patient was admitted, underwent the above-stated  procedure without difficulty.  He was then transferred to the PACU and then  to the floor for continued postoperative care.  On postoperative day #1, the  patient was doing significantly better.  He noted increase in his lower  extremity pain; however, he stated he still was having some difficulty  voiding; however, no numbness in the __________ area was noted.  The  patient's motor and neurovascular function remained intact.  PT/OT was  contacted to instruct the patient in back precautions.  Throughout the  hospital course, the patient continued to have issues  with voiding, stating  he was not able.  In-and-out cath's were performed.  Urology consult was  requested for their assistance for postoperative urinary retention.  Orthopedically, the patient continued to do fairly well.  Pain was well-  controlled on p.o. analgesics.  He was up walking without difficulty and  noted improvement in his lower extremity pain.  Urology felt the patient  could be discharged home with the Foley intact if he was unable to void;  however, the patient was able to void successfully multiple times prior to  discharge.  So, at this point, he could be discharged home.   DISCHARGE INSTRUCTIONS:  1. Pain medicine, Vicodin 1-2 q.4-6h. p.r.n. pain.  2. Back precautions were given to the patient and in regard to physical     therapy, handouts were given in this regard.  3. The patient was to wear a corset at all times when walking.  4. Avoid bending, stooping, twisting, or lifting.  5. Wound care is to keep his dressing clean and dry.  Change the dressing     daily.  6. He is to follow up with Dr. Shelle Iron  in approximately 10-14 days with a     follow up appointment with urology in regard to the urinary retention.  7. Diet is as tolerated.     Roma Schanz, P.A.                   Jene Every, M.D.    CS/MEDQ  D:  11/22/2003  T:  11/22/2003  Job:  [id number]

## 2011-03-14 NOTE — H&P (Signed)
[name], [name]                           ACCOUNT NO.:  [id number]   MEDICAL RECORD NO.:  [id number]                   PATIENT TYPE:  INP   LOCATION:  0464                                 FACILITY:  [facility]   PHYSICIAN:  Jene Every, M.D.                 DATE OF BIRTH:  11/04/1960   DATE OF ADMISSION:  10/16/2003  DATE OF DISCHARGE:  10/20/2003                                HISTORY & PHYSICAL   REDICTATION   CHIEF COMPLAINT:  Right lower extremity radicular pain and urinary  hesitancy.   HISTORY:  This is a 50 year old male who is status post a lumbar  decompression at L4-5 with bilateral hemilaminotomies and microdiskectomies  on the Friday, the 17th.  The patient was discharged postoperative day #1 to  home with reduced leg pain, in satisfactory condition.  He did have, on the  following day going to the bathroom, getting off the toilet and developed  right lower extremity radicular pain that progressively became severe into  the right foot.  This escalated and the patient presented to the office  Monday morning after discussion of the situation with the on-call [facility]  Orthopedic Physician.  The patient presented with complaints of right lower  extremity radicular pain which were severe.  He had no fevers, no chills,  nausea, vomiting, or headache.  He reported no loss of bowel or bladder  function, though he reported urinary hesitancy.  The examination in the  office this morning indicated a positive straight leg raise on the right.  He had EHL weakness, though had a normal rectal examination with good  perineal sensation, no saddle anesthesia, good rectal tone.  The patient  also complained of palpitations.  He had a postoperative electrocardiogram  which was negative prior to his discharge.  Based upon these multiple  complaints I referred him to the emergency room for work-up for his  palpitations as well as a lumbar MRI to rule out a recurrent disk herniation  or  epidural hematoma.  The patient underwent an evaluation by Dr. Gray Bernhardt.  The electrocardiogram work-up for palpitations was negative.  His  MRI of the lumbar spine, however, revealed a large central recurrent disk  herniation without evidence of epidural hematoma.  The patient had fairly  significant stenosis, though this was discussed with Dr. Chestine Spore who was the  radiologist.  The patient was therefore admitted for observation and  consideration of repeat diskectomy.   PHYSICAL EXAMINATION:  VITAL SIGNS:  Temperature 97.7, heart rate 79,  respirations 18, blood pressure 128/86.  HEENT:  Normocephalic, atraumatic.  PULMONARY:  Clear to auscultation.  COR:  Heart was regular rate and rhythm.  ABDOMEN:  Soft, nontender.  No distended bladder was noted.  No flank pain  with percussion.  EXTREMITIES:  Straight leg raise positive for posterior thigh and calf pain.  Contralateral straight leg raise produced left buttock pain.  EHL was 5/5  bilaterally.  Sensation was intact in the lower extremities.  RECTAL:  Normal sensation.  Normal rectal tone.  Perineal sensation was  intact.   LABORATORIES:  MRI demonstrated a central recurrent disk herniation at L4-5  with associated central canal stenosis.   White blood cell count 5.7, platelet count 248,000.  His urinalysis was  clear.  His potassium was 4.4.   IMPRESSION:  Recurrent disk herniation at L4-5 with severe radicular pain,  central canal stenosis with complaints of urinary hesitancy, though the  patient voids and had a normal rectal examination, normal sensation in the  perineum, and therefore no evidence of cauda equina syndrome.   PLAN:  Discussed with the patient his pathology relevant anatomy due to his  severe pain in the presence of a large disk herniation with significant  central canal stenosis.  We mutually agreed to proceed with a repeat  microdiskectomy of L4-5 on the right.  We discussed the risks and benefits   including bleeding, infection, damage to neurovascular structures, CSF  leakage, epidural fibrosis, adjacent segment disease, cauda equina, etc.   Again, the patient had reported minimal to no leg pain after his initial  surgery and the recurrence of the leg pain after he exited from the seated  position while attempting a bowel movement at home.                                               Jene Every, M.D.    Cordelia Pen  D:  10/27/2003  T:  10/27/2003  Job:  [id number]

## 2011-03-14 NOTE — Op Note (Signed)
[name], [name]               ACCOUNT NO.:  [id number]   MEDICAL RECORD NO.:  [id number]          PATIENT TYPE:  INP   LOCATION:  5041                         FACILITY:  [facility]   PHYSICIAN:  Mark C. Ophelia Charter, M.D.    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   PROCEDURES:  1.  Right L4-5 microdiskectomy for recurrent herniated nucleus pulposis.  2.  Removal of free fragment and decompression.  3.  Right transverse lumbar interbody fusion pedicle screw instrumentation.  4.  Left lateral gutter fusion.  5.  Iliac crest bone graft, tricortical and local bone.   SURGEON:  Mark C. Ophelia Charter, M.D.   ASSISTANT:  Vanita Panda. Magnus Ivan, M.D.   ANESTHESIA:  GOT plus Marcaine local 26 mL.   ESTIMATED BLOOD LOSS:  1400 mL.  Cell Saver retransfusion of 700 mL.   DRAINS:  One Hemovac in the iliac crest.   COMPLICATIONS:  None.   BRIEF HISTORY:  This 50 year old male, Worker's Compensation injury, had  disk herniation with repeat surgery three days later.  He has had persistent  problems with MRI scan showing a large massive free fragment with inferior  migration and continued pain, lateral tilting on AP x-ray and retrolisthesis  noted on lateral x-ray with instability.  Pars are intact.  He had previous  surgery done by one of the other orthopedic surgeons here in town.   DESCRIPTION OF PROCEDURE:  After induction of general anesthesia and  orotracheal intubation, placed on the Andrews frame with careful padding.  The back was prepped with DuraPrep.  Preoperative vancomycin was given.  The  area was squared with towels.  Betadine and Vi-Drape applied after sterile  skin marker.   A midline incision was made.  The patient had had previous old  right L4-5 microdiskectomy incisions just off the midline and a long midline  incision was made.  Subperiosteal dissection on the lamina was performed  using the old scar.  A pair of Kocher clamps were placed.  Cross table x-ray  with fluoroscopy was used to confirm the appropriate level at L4-5 and plans  for the decompression.  The spinous process posterior elements were removed.  The soft tissue was cleaned off.  It was cut in small pieces to use for  later bone graft.  The dura was identified and there was extensive scarring  in the right lateral gutter initially.  There was venous bleeding from both  the bone, as well as epidural veins and just about all tissues.  Some spray  thrombin was used and the disk had to be initially identified on the left  side after bone was removed out to the level of both pedicles.  On the  right, showed extensive scarring instead of disk.  There was intermixed disk  material with extensive  scar tissue.  The disk space was very narrow and  would just allow a micropituitary initially.  Spurs had to be removed off of  the endplates to allow further decompression.  Using the Synthes gouges,  first a 7 and then the 9, both the right and left side decompressions were  performed of the disk.  Big chunks of disk were removed.  There were good  bleeding endplates.  Once bone was removed on the right side for the TLIF,  taking off the facet for exposure of the L4 nerve root as it came underneath  the pedicle.  Careful retraction for exposure of the disk.  With this  lateral approach, we were able to get the scar tissue and disk material  sharp dissected using the operating microscope, removing the free fragment  and scar tissue.  With the patient's previous scan, a large chunk of disk  was expected, however, this was not found and it was just thick, fibrous  scar tissue extremely adherent and had to be  peeled off of the dura with  sharp dissection and microdissection.  No dural tear occurred.  The foramina  was enlarged.  The nerve root was freed.  A hockey stick was passed anterior  to the nerve root, inferior to the nerve root and the axilla of L5 nerve  root with no areas of compression.  There was a large wad of disk material  out laterally pressing up against the right L4 nerve root as well that was  not seen on the previous MRI and this material was removed as well.  Once  both sides had been decompressed and foraminotomies had been performed under  fluoroscopic visualization, the patient had tricortical bone from the right  iliac crest sharply impacted for interbody fusion.  It was impacted from the  right side all the way up to the intact anterior longitudinal ligament and  checked under fluoroscopy.  Three large pieces of tricortical graft were  placed followed by a precut interbody 9 mm allograft.  Synthes pedicle  screws were then inserted in the L4 pedicle and then the L5 pedicle.  AP and  lateral fluoroscopy was used to confirm exact placement of all four screws.  All screws were in the canal in good position.  No violation.  The pedicle  nerve roots were visualized. The foramina was palpated.  The dura was  intact.  After irrigation, the left lateral gutter was exposed and the  remaining chunks of cancellus bone and cortical cancellus strips from local  bone graft from the lamina and facet were meticulously packed after a power  bur was used to bur up the transverse processes and lateral gutter.  Then 45  mm titanium rods were inserted right and left.  The graft was compressed and  tightened down all screws.  Fluoroscopy was removed after a final x-ray of  the screws was taken AP and lateral.  All four screws were in.  No spot  films were taken the rods and set screws were used.  There was good compression.  There were 2-3 mm of rod sticking out on each end of the set   screws.  Final inspection was made of the dura and the foramina to make sure  no pieces of bone had fallen into the canal.  The canal was clean and dry.  Final thrombin spray.  Hemovacs placed through a separate stab incision in  the iliac crest where a large piece of Gelfoam  had been placed.  It was  removed.  The deep fascia in both midline incisions, as well as the iliac  crest, was closed with 0 Vicryl.  Then 2-0 Vicryl was used in the  subcutaneous tissue.  Skin with staple closure.  Marcaine with epinephrine  infiltration in both incisions.  Transferred to the recovery room  neurologically intact.  The instrument count, needle count and paddy count  were correct.  There were 700 mL of blood retransfused.      MCY/MEDQ  D:  03/28/2005  T:  03/30/2005  Job:  [id number]

## 2011-03-14 NOTE — Op Note (Signed)
NAMEHAGOP, [name]                           ACCOUNT NO.:  [id number]   MEDICAL RECORD NO.:  [id number]                   PATIENT TYPE:  INP   LOCATION:  0372                                 [facility]ACILITY:  [facility]   PHYSICIAN:  Jene Every, M.D.                 DATE OF BIRTH:  [birth year]   DATE OF PROCEDURE:  DATE OF DISCHARGE:                                 OPERATIVE REPORT   PREOPERATIVE DIAGNOSIS:  Recurrent disk herniation L4-5 right.   POSTOPERATIVE DIAGNOSIS:  Recurrent disk herniation L4-5 right.   PROCEDURE:  Redo microdiskectomy L4-5 right.   ANESTHESIA:  General.   ASSISTANT:  Marlowe Kays, M.D.   BRIEF HISTORY:  This 50 year old is status post bilateral hemilaminotomy and  microdiskectomy for a central disk herniation at 4-5.  The patient  postoperatively had done well with diminished leg pain and was discharged  postoperative day #1 doing well to home with some palpitations that was  worked up with a negative electrocardiogram. The patient reported the  following day after going to the bathroom, bending forward severe pain into  the buttock and into the lower extremity that escalated that day. The  patient presented then Monday, the following day, to the office for  evaluation with severe pain into the lower extremity complaining of urinary  hesitancy as well as palpitations.  He was sent to the emergency room where  he underwent a cardiac workup which was negative.  A subsequent MRI of the  lumbar spine and revealed a recurrent disk herniation at L4-5 with  significant compression upon the thecal sac with a measurement of 5 mm in  diameter. 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  The  patient was admitted for observation and to schedule a repeat diskectomy.  He had severe pain that required IV Dilaudid.  He had urinary hesitancy but  had a good rectal exam and good perineal sensation.  We discussed the risks  and benefits again including  bleeding, infection, injury to vascular  structures, recurrent disk herniation, need for fusion in the future, etc.   The patient in the supine position and after an adequate level of general  anesthesia and 1 g of Kefzol was placed prone on the Magee frame. All bony  prominences were well padded. The previous sutures were removed and  discarded.  The wound was copiously irrigated with antibiotic irrigation.  We digitally dissected to the laminotomy and then placed a Penfield there  and confirmed the disk space with x-ray.  I then removed the thrombin soaked  Gelfoam, irrigated it copiously. The operating microscope was draped and  brought onto the surgical field. I enlarged the hemilaminotomy in the caudad  edge of 4 with a 3 mm Kerrison.  I removed some cephalad ligamentum flavum.  Noted was a large recurrent disk herniation immediately. This was retrieved  with a pituitary.  With a Derrico protecting  the neural elements, I then  entered the disk space and retrieved two small separate fragments to further  mobilize the disk space with a hockey stick and Epstein again removing all  evidence of herniate disk material.  I checked beneath the thecal sac and  the axilla of the nerve root out the foramen of 4 and 5 found to be widely  patent, no evidence of residual disk herniation in the nerve root  compression.  There was good excursion of the 5 root approximately a  centimeter medial to the pedicel without tension.  I then copiously  irrigated the disk space with antibiotic irrigation.  Bipolar electrocautery  has been utilized to achieve hemostasis. No evidence of CSF leakage or  active bleeding.   Next, thrombin soaked Gelfoam was placed in the laminotomy defect.  The  paraspinous muscle inspected with no evidence of active bleeding.  After the  [facility] retractor was removed, the fascia was repaired with #1 Vicryl  interrupted figure-of-eight sutures, subcutaneous tissue  reapproximated with  2-0 Vicryl simple sutures and the skin was reapproximated with 4-0  subcuticular Prolene.  A sterile dressing was applied. The patient supine on  the hospital bed, extubated without difficulty and transported to the  recovery room in satisfactory condition.   The patient tolerated the procedure well with no complications.                                               Jene Every, M.D.    Cordelia Pen  D:  10/17/2003  T:  10/17/2003  Job:  [id number]

## 2011-03-14 NOTE — H&P (Signed)
[name], [name]                           ACCOUNT NO.:  [id number]   MEDICAL RECORD NO.:  [id number]                   PATIENT TYPE:  INP   LOCATION:  0372                                 [facility]ACILITY:  [facility]   PHYSICIAN:  Jene Every, M.D.                 DATE OF BIRTH:  [birth year]   DATE OF ADMISSION:  10/16/2003  DATE OF DISCHARGE:                                HISTORY & PHYSICAL   CHIEF COMPLAINT:  Right lower extremity radicular pain, urinary hesitancy.   HISTORY:  This is a 50 year old with __________ lumbar decompression with  central decompression __________.  Bilateral hemilaminotomy decompression  __________ bilateral.  The patient tolerated the procedure well without  complications and was discharged the a.m. postoperative day one.  No leg  pain postoperatively.  The patient was discharged to home. On the following  day going to the bathroom he started developing ___________ radicular pain  that progressively became severe into the right foot.  This escalated and  the patient presented to the office ____________ morning after discussing  the situation with ____________.  The patient presented with complaints of  right lower extremity radicular pain severe, no fever, no chills, headache,  nausea or vomiting.  He had reported no loss of bowel or bladder function.  ____________ hesitancy.  The examination in the office indicated a positive  impingement sign, right ___________ weakness, normal rectal exam, good  peroneal sensation.  The patient's ultrasound ______________.  The patient  also had complained of palpitations.  Postoperatively he had a postoperative  electrocardiogram which was unremarkable.  He presented to the office  complaining of palpitations as well. Emergency room workup positive for  palpitations as well as a lumbar diagnostic study to rule out epidural  hematoma or recurrent disk herniation. The patient underwent an evaluation  with Dr. Gray Bernhardt  and an electrocardiogram evaluation for palpations  which was negative for cardiogenic etiology.  The MRI, however, revealed a  large central recurrent disk herniation at 4-5, no epidural hematoma.  The  patient had fairly significant stenosis and this was discussed with Dr.  Chestine Spore.  The patient was therefore admitted for observation and consideration  of repeat diskectomy.   PHYSICAL EXAMINATION:  VITAL SIGNS:  Temperature was 97.7, heart rate 79,  respirations 18, blood pressure 128/86.  HEENT:  Normocephalic, atraumatic.  PULMONARY:  Clear to auscultation.  HEART:  Regular rhythm.  ABDOMEN:  Soft, nontender, no distended bladder was noted.  Nontender.  No  flank pain with percussion.  EXTREMITIES:  Straight leg raise ____________ posterior thigh and calf pain  ___________ straight leg raise with left buttock pain.  ____________5-/5  bilaterally __________ .  RECTAL:  Revealed normal sensation and tone.  Perineal sensation intact.    MRI again demonstrated recurrent central disk herniation at 4-5  ______________ associated central stenosis.   LABORATORY DATA:  White blood cell count is 5.7,  platelet count 248.  UA was  clear.  Potassium was 4.4.   IMPRESSION:  Recurrent disk herniation at L4-5, large ______________  radicular pain, central canal stenosis with complaint of urinary hesitancy  though the patient voids and a normal rectal exam, normal perineum and  genitalia.  No evidence of cauda equina syndrome.   PLAN:  On discussion with the patient _____________ pathology as well as  anatomy, he agreed to proceed with repeat surgery for decompression.  Will  proceed most likely centrally for decompression and diskectomy.  Discussed  the risks and benefits including bleeding, infection, injury to  neurovascular structures, CSF leakage, epidural fibrosis, adjacent segment  disease, etc.                                               Jene Every, M.D.    JB/MEDQ  D:   10/17/2003  T:  10/17/2003  Job:  [id number]

## 2011-04-02 ENCOUNTER — Ambulatory Visit: Payer: Self-pay | Admitting: Endocrinology

## 2011-04-07 ENCOUNTER — Other Ambulatory Visit: Payer: Self-pay | Admitting: Emergency Medicine

## 2011-04-07 DIAGNOSIS — E041 Nontoxic single thyroid nodule: Secondary | ICD-10-CM

## 2011-04-14 ENCOUNTER — Ambulatory Visit
Admission: RE | Admit: 2011-04-14 | Discharge: 2011-04-14 | Disposition: A | Discharge status: Home / Self Care | Payer: Managed Care, Other (non HMO) | Source: Ambulatory Visit | Attending: Emergency Medicine | Admitting: Emergency Medicine

## 2011-04-14 DIAGNOSIS — E041 Nontoxic single thyroid nodule: Secondary | ICD-10-CM

## 2011-08-01 LAB — B-NATRIURETIC PEPTIDE (CONVERTED LAB): Pro B Natriuretic peptide (BNP): <30 pg/mL (ref 0.0–100.0)

## 2011-08-01 LAB — POCT CARDIAC MARKERS
CKMB, poc: 6.2 ng/mL (ref 1.0–8.0)
Myoglobin, poc: 78.4 ng/mL (ref 12–200)
Myoglobin, poc: 86.3 ng/mL (ref 12–200)
Troponin i, poc: <0.05 ng/mL (ref 0.00–0.09)

## 2011-08-01 LAB — DIFFERENTIAL
Eosinophils Relative: 2 % (ref 0–5)
Lymphocytes Relative: 48 % — ABNORMAL HIGH (ref 12–46)
Lymphs Abs: 2.7 10*3/uL (ref 0.7–4.0)
Monocytes Absolute: 0.4 10*3/uL (ref 0.1–1.0)
Monocytes Relative: 8 % (ref 3–12)

## 2011-08-01 LAB — URINALYSIS, ROUTINE W REFLEX MICROSCOPIC
Bilirubin Urine: NEGATIVE
Hgb urine dipstick: NEGATIVE
Specific Gravity, Urine: 1.006 (ref 1.005–1.030)
Urobilinogen, UA: 0.2 mg/dL (ref 0.0–1.0)

## 2011-08-01 LAB — CBC
HCT: 43 % (ref 39.0–52.0)
Hemoglobin: 14.6 g/dL (ref 13.0–17.0)
RDW: 13.8 % (ref 11.5–15.5)
WBC: 5.6 10*3/uL (ref 4.0–10.5)

## 2011-09-23 ENCOUNTER — Ambulatory Visit: Payer: Managed Care, Other (non HMO) | Admitting: Endocrinology

## 2011-09-23 DIAGNOSIS — Z0289 Encounter for other administrative examinations: Secondary | ICD-10-CM

## 2011-10-13 ENCOUNTER — Ambulatory Visit (INDEPENDENT_AMBULATORY_CARE_PROVIDER_SITE_OTHER)
Admission: RE | Admit: 2011-10-13 | Discharge: 2011-10-13 | Disposition: A | Discharge status: Home / Self Care | Payer: Managed Care, Other (non HMO) | Source: Ambulatory Visit | Attending: Endocrinology | Admitting: Endocrinology

## 2011-10-13 ENCOUNTER — Encounter: Payer: Self-pay | Admitting: Endocrinology

## 2011-10-13 ENCOUNTER — Ambulatory Visit (INDEPENDENT_AMBULATORY_CARE_PROVIDER_SITE_OTHER): Payer: Managed Care, Other (non HMO) | Admitting: Endocrinology

## 2011-10-13 ENCOUNTER — Other Ambulatory Visit (INDEPENDENT_AMBULATORY_CARE_PROVIDER_SITE_OTHER): Payer: Managed Care, Other (non HMO)

## 2011-10-13 VITALS — BP 112/72 | HR 75 | Temp 98.0°F | Ht 64.5 in | Wt 160.4 lb

## 2011-10-13 DIAGNOSIS — E041 Nontoxic single thyroid nodule: Secondary | ICD-10-CM

## 2011-10-13 DIAGNOSIS — M2550 Pain in unspecified joint: Secondary | ICD-10-CM

## 2011-10-13 DIAGNOSIS — F3289 Other specified depressive episodes: Secondary | ICD-10-CM

## 2011-10-13 DIAGNOSIS — Z125 Encounter for screening for malignant neoplasm of prostate: Secondary | ICD-10-CM

## 2011-10-13 DIAGNOSIS — E7800 Pure hypercholesterolemia, unspecified: Secondary | ICD-10-CM

## 2011-10-13 DIAGNOSIS — Z79899 Other long term (current) drug therapy: Secondary | ICD-10-CM

## 2011-10-13 DIAGNOSIS — F329 Major depressive disorder, single episode, unspecified: Secondary | ICD-10-CM

## 2011-10-13 DIAGNOSIS — K769 Liver disease, unspecified: Secondary | ICD-10-CM

## 2011-10-13 DIAGNOSIS — E069 Thyroiditis, unspecified: Secondary | ICD-10-CM

## 2011-10-13 LAB — LIPID PANEL
LDL Cholesterol: 55 mg/dL (ref 0–99)
VLDL: 32.2 mg/dL (ref 0.0–40.0)

## 2011-10-13 LAB — BASIC METABOLIC PANEL
Calcium: 9.3 mg/dL (ref 8.4–10.5)
GFR: 89.83 mL/min (ref >60.00)
Potassium: 4.2 mEq/L (ref 3.5–5.1)
Sodium: 140 mEq/L (ref 135–145)

## 2011-10-13 LAB — CBC WITH DIFFERENTIAL/PLATELET
Eosinophils Relative: 1.6 % (ref 0.0–5.0)
HCT: 42.8 % (ref 39.0–52.0)
Hemoglobin: 14.6 g/dL (ref 13.0–17.0)
Lymphocytes Relative: 47.3 % — ABNORMAL HIGH (ref 12.0–46.0)
Lymphs Abs: 2.8 10*3/uL (ref 0.7–4.0)
Monocytes Relative: 9.3 % (ref 3.0–12.0)
Platelets: 273 10*3/uL (ref 150.0–400.0)
WBC: 6 10*3/uL (ref 4.5–10.5)

## 2011-10-13 LAB — HEPATIC FUNCTION PANEL
ALT: 69 U/L — ABNORMAL HIGH (ref 0–53)
AST: 44 U/L — ABNORMAL HIGH (ref 0–37)
Alkaline Phosphatase: 119 U/L — ABNORMAL HIGH (ref 39–117)
Total Bilirubin: 0.7 mg/dL (ref 0.3–1.2)

## 2011-10-13 LAB — PSA: PSA: 1.7 ng/mL (ref 0.10–4.00)

## 2011-10-13 NOTE — Patient Instructions (Addendum)
blood tests and an x-ray, are being requested for you today.  please call [phone] to hear your test results.  You will be prompted to enter the 9-digit "MRN" number that appears at the top left of this page, followed by #.  Then you will hear the message. Refer to a rheumatology specialist.  you will receive a phone call, about a day and time for an appointment

## 2011-10-13 NOTE — Progress Notes (Signed)
  Subjective:    Patient ID: [name], male    DOB: 1961-03-21, 50 y.o.   MRN: [id number]  HPI Pt had bx of a thyroid nodule in 2008--benign.   Pt has approx 1 year of moderate arthralgias, worst at the knees and mcp's.   He takes pravachol as rx'ed.  Past Medical History  Diagnosis Date  . ARM PAIN, LEFT 11/20/2009  . BIPOLAR DISORDER UNSPECIFIED 12/13/2007  . CHEST PAIN 03/23/2009  . DEPRESSION 12/13/2007  . FATIGUE 03/23/2009  . GLAUCOMA 09/03/2009  . HEMORRHOIDS 09/03/2009  . HOARSENESS 09/03/2009  . HYPERCHOLESTEROLEMIA 03/11/2010  . MYALGIA 08/13/2009  . NECK PAIN 01/18/2009  . Palpitations 03/23/2009  . Polydipsia 01/24/2008  . Shortness of breath 03/23/2009  . THYROID NODULE 08/28/2008  . THYROIDITIS 12/13/2007    Past Surgical History  Procedure Date  . Back surgery     x 3  . Finger surgery     History   Social History  . Marital Status: Single    Spouse Name: N/A    Number of Children: N/A  . Years of Education: N/A   Occupational History  . Not on file.   Social History Main Topics  . Smoking status: Never Smoker   . Smokeless tobacco: Not on file  . Alcohol Use: No  . Drug Use: No  . Sexually Active:    Other Topics Concern  . 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 He is originally form [place]    Current Outpatient Prescriptions on File Prior to Visit  Medication Sig Dispense Refill  . buPROPion (WELLBUTRIN SR) 150 MG 12 hr tablet Take 450 mg by mouth every morning.        . Multiple Vitamin (MULTIVITAMIN) tablet Take 1 tablet by mouth daily.          No Known Allergies  Family History  Problem Relation Age of Onset  . Hypertension Other     BP 112/72  Pulse 75  Temp(Src) 98 F (36.7 C) (Oral)  Ht 5' 4.5" (1.638 m)  Wt 160 lb 6.4 oz (72.757 kg)  BMI 27.11 kg/m2  SpO2 96%    Review of Systems Denies fever and rash    Objective:    Physical Exam VITAL SIGNS:  See vs page GENERAL: no distress NECK: There is slight fullness at the right thyroid lobe.  No palpable lymphadenopathy at the anterior neck. Hands are normal to my exam.     Lab Results  Component Value Date   WBC 6.0 10/13/2011   HGB 14.6 10/13/2011   HCT 42.8 10/13/2011   PLT 273.0 10/13/2011   GLUCOSE 90 10/13/2011   CHOL 164 10/13/2011   TRIG 161.0* 10/13/2011   HDL 76.70 10/13/2011   LDLDIRECT 120.4 09/30/2010   LDLCALC 55 10/13/2011   ALT 69* 10/13/2011   AST 44* 10/13/2011   NA 140 10/13/2011   K 4.2 10/13/2011   CL 104 10/13/2011   CREATININE 1.0 10/13/2011   BUN 16 10/13/2011   CO2 27 10/13/2011   TSH 1.40 10/13/2011   PSA 1.70 10/13/2011      Assessment & Plan:  Thyroid nodule, stable Dyslipidemia, on pravachol Arthralgias, new, uncertain etiology

## 2011-11-05 ENCOUNTER — Ambulatory Visit (INDEPENDENT_AMBULATORY_CARE_PROVIDER_SITE_OTHER): Payer: Managed Care, Other (non HMO)

## 2011-11-05 DIAGNOSIS — J069 Acute upper respiratory infection, unspecified: Secondary | ICD-10-CM

## 2011-11-10 ENCOUNTER — Ambulatory Visit (INDEPENDENT_AMBULATORY_CARE_PROVIDER_SITE_OTHER): Payer: Managed Care, Other (non HMO)

## 2011-11-10 DIAGNOSIS — J019 Acute sinusitis, unspecified: Secondary | ICD-10-CM

## 2011-11-10 DIAGNOSIS — J40 Bronchitis, not specified as acute or chronic: Secondary | ICD-10-CM

## 2012-02-05 ENCOUNTER — Other Ambulatory Visit: Payer: Self-pay | Admitting: Emergency Medicine

## 2012-02-11 ENCOUNTER — Other Ambulatory Visit: Payer: Self-pay | Admitting: Physician Assistant

## 2012-02-18 ENCOUNTER — Ambulatory Visit (INDEPENDENT_AMBULATORY_CARE_PROVIDER_SITE_OTHER): Payer: Managed Care, Other (non HMO) | Admitting: Family Medicine

## 2012-02-18 VITALS — BP 125/92 | HR 84 | Temp 98.0°F | Resp 16 | Ht 65.0 in | Wt 148.4 lb

## 2012-02-18 DIAGNOSIS — N390 Urinary tract infection, site not specified: Secondary | ICD-10-CM

## 2012-02-18 DIAGNOSIS — R5383 Other fatigue: Secondary | ICD-10-CM

## 2012-02-18 DIAGNOSIS — R5381 Other malaise: Secondary | ICD-10-CM

## 2012-02-18 DIAGNOSIS — E785 Hyperlipidemia, unspecified: Secondary | ICD-10-CM

## 2012-02-18 DIAGNOSIS — R42 Dizziness and giddiness: Secondary | ICD-10-CM

## 2012-02-18 LAB — POCT CBC
Granulocyte percent: 39.8 %G (ref 37–80)
HCT, POC: 46.9 % (ref 43.5–53.7)
Hemoglobin: 15.3 g/dL (ref 14.1–18.1)
Lymph, poc: 4 — AB (ref 0.6–3.4)
MCH, POC: 28.7 pg (ref 27–31.2)
MCHC: 32.6 g/dL (ref 31.8–35.4)
MCV: 87.9 fL (ref 80–97)
MID (cbc): 0.5 (ref 0–0.9)
MPV: 7 fL (ref 0–99.8)
POC Granulocyte: 3 (ref 2–6.9)
POC LYMPH PERCENT: 53.9 %L — AB (ref 10–50)
POC MID %: 6.3 %M (ref 0–12)
Platelet Count, POC: 334 10*3/uL (ref 142–424)
RBC: 5.34 M/uL (ref 4.69–6.13)
RDW, POC: 14.2 %
WBC: 7.5 10*3/uL (ref 4.6–10.2)

## 2012-02-18 LAB — POCT UA - MICROSCOPIC ONLY
Bacteria, U Microscopic: NEGATIVE
Casts, Ur, LPF, POC: NEGATIVE
Crystals, Ur, HPF, POC: NEGATIVE
Mucus, UA: NEGATIVE
Yeast, UA: NEGATIVE

## 2012-02-18 LAB — COMPREHENSIVE METABOLIC PANEL
ALT: 50 U/L (ref 0–53)
AST: 28 U/L (ref 0–37)
Albumin: 4.6 g/dL (ref 3.5–5.2)
Alkaline Phosphatase: 103 U/L (ref 39–117)
Potassium: 4.1 mEq/L (ref 3.5–5.3)
Sodium: 140 mEq/L (ref 135–145)
Total Bilirubin: 0.6 mg/dL (ref 0.3–1.2)
Total Protein: 7.1 g/dL (ref 6.0–8.3)

## 2012-02-18 LAB — POCT URINALYSIS DIPSTICK
Bilirubin, UA: NEGATIVE
Glucose, UA: NEGATIVE
Ketones, UA: NEGATIVE
Nitrite, UA: NEGATIVE
Protein, UA: NEGATIVE
Spec Grav, UA: 1.015
Urobilinogen, UA: 0.2
pH, UA: 7.5

## 2012-02-18 LAB — COMPREHENSIVE METABOLIC PANEL WITH GFR
BUN: 10 mg/dL (ref 6–23)
CO2: 27 meq/L (ref 19–32)
Calcium: 9.2 mg/dL (ref 8.4–10.5)
Chloride: 103 meq/L (ref 96–112)
Creat: 1.03 mg/dL (ref 0.50–1.35)
Glucose, Bld: 80 mg/dL (ref 70–99)

## 2012-02-18 LAB — TSH: TSH: 2.52 u[IU]/mL (ref 0.350–4.500)

## 2012-02-18 MED ORDER — MECLIZINE HCL 25 MG PO TABS: 25.0000 mg | ORAL_TABLET | Freq: Three times a day (TID) | ORAL | Status: AC | PRN

## 2012-02-18 MED ORDER — ATORVASTATIN CALCIUM 40 MG PO TABS: 40.0000 mg | ORAL_TABLET | Freq: Every day | ORAL | Status: DC

## 2012-02-18 MED ORDER — CIPROFLOXACIN HCL 250 MG PO TABS
250.0000 mg | ORAL_TABLET | Freq: Two times a day (BID) | ORAL | Status: AC
Start: 2012-02-18 — End: 2012-02-28

## 2012-02-18 NOTE — Progress Notes (Signed)
[facility]:  Office Visit  Chief Complaint:  Chief Complaint  Patient presents with  . Dizziness    started today  . Headache    started today    HPI: [name] is a 51 y.o. male who complains of Dizziness since this AM. Still present. Room spinning with standing. This has occurred years ago. Not necessarily orthostatic. He has frontal HA. Denies  CP, SOB, N/V, abd pain, diaphoresis, palpitations. No stroke sxs. Has not tried anything. He came straight from work here. He has no vision changes. He denies any URI sxs, ie era pain/pressure, sinus infection/congestion.   Past Medical History  Diagnosis Date  . ARM PAIN, LEFT 11/20/2009  . BIPOLAR DISORDER UNSPECIFIED 12/13/2007  . CHEST PAIN 03/23/2009  . DEPRESSION 12/13/2007  . FATIGUE 03/23/2009  . GLAUCOMA 09/03/2009  . HEMORRHOIDS 09/03/2009  . HOARSENESS 09/03/2009  . HYPERCHOLESTEROLEMIA 03/11/2010  . MYALGIA 08/13/2009  . NECK PAIN 01/18/2009  . Palpitations 03/23/2009  . Polydipsia 01/24/2008  . Shortness of breath 03/23/2009  . THYROID NODULE 08/28/2008  . THYROIDITIS 12/13/2007   Past Surgical History  Procedure Date  . Back surgery     x 3  . Finger surgery    History   Social History  . Marital Status: Single    Spouse Name: N/A    Number of Children: N/A  . Years of Education: N/A   Social History Main Topics  . Smoking status: Never Smoker   . Smokeless tobacco: None  . Alcohol Use: No  . Drug Use: No  . Sexually Active:    Other Topics Concern  . 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 He is originally form [place]   Family History  Problem Relation Age of Onset  . Hypertension Other    No Known Allergies Prior to Admission medications   Medication Sig Start Date End Date Taking? Authorizing Provider  atorvastatin (LIPITOR) 40 MG tablet Take 1 tablet (40 mg total) by mouth daily.  NEEDS OFFICE VISIT FOR MORE 02/05/12  Yes Chelle S Jeffery, PA-C  buPROPion (WELLBUTRIN SR) 150 MG 12 hr tablet Take 450 mg by mouth every morning.     Yes Historical Provider, MD  mirtazapine (REMERON) 30 MG tablet Take 30 mg by mouth at bedtime.  09/07/11  Yes Historical Provider, MD  Multiple Vitamin (MULTIVITAMIN) tablet Take 1 tablet by mouth daily.     Yes Historical Provider, MD     ROS: The patient denies fevers, chills, night sweats, unintentional weight loss, chest pain, palpitations, wheezing, dyspnea on exertion, nausea, vomiting, abdominal pain, dysuria, hematuria, melena, numbness, weakness, or tingling. + dizziness, HA  All other systems have been reviewed and were otherwise negative with the exception of those mentioned in the HPI and as above.    PHYSICAL EXAM: Filed Vitals:   02/18/12 1439  BP: 125/92  Pulse: 84  Temp:   Resp:    Filed Vitals:   02/18/12 1354  Height: 5\' 5"  (1.651 m)  Weight: 148 lb 6.4 oz (67.314 kg)   Body mass index is 24.70 kg/(m^2).  General: Alert, no acute distress HEENT:  Normocephalic, atraumatic, oropharynx patent. PERRLA, EOMI, + glaucoma Cardiovascular:  Regular rate and rhythm, no rubs murmurs or gallops.  No Carotid bruits, radial pulse intact. No pedal edema.  Respiratory: Clear to auscultation bilaterally.  No wheezes, rales, or rhonchi.  No cyanosis, no use of accessory musculature GI: No organomegaly, abdomen is soft and non-tender, positive bowel sounds.  No masses. Skin: No rashes. Neurologic: Facial musculature symmetric. CN 2-12 grossly intact, +/-rhomberg, 5/5 strength, 2/2 DTRs, sensation intact. Dix Hallpike Unequivical. + dizzy with head movement on exam Psychiatric: Patient is appropriate throughout our interaction. Lymphatic: No cervical lymphadenopathy Musculoskeletal: Gait intact.    LABS: Results for orders placed in visit on 02/18/12  POCT CBC      Component Value Range   WBC 7.5  4.6 - 10.2 (K/uL)   Lymph,  poc 4.0 (*) 0.6 - 3.4    POC LYMPH PERCENT 53.9 (*) 10 - 50 (%L)   MID (cbc) 0.5  0 - 0.9    POC MID % 6.3  0 - 12 (%M)   POC Granulocyte 3.0  2 - 6.9    Granulocyte percent 39.8  37 - 80 (%G)   RBC 5.34  4.69 - 6.13 (M/uL)   Hemoglobin 15.3  14.1 - 18.1 (g/dL)   HCT, POC 40.9  81.1 - 53.7 (%)   MCV 87.9  80 - 97 (fL)   MCH, POC 28.7  27 - 31.2 (pg)   MCHC 32.6  31.8 - 35.4 (g/dL)   RDW, POC 91.4     Platelet Count, POC 334  142 - 424 (K/uL)   MPV 7.0  0 - 99.8 (fL)  POCT URINALYSIS DIPSTICK      Component Value Range   Color, UA yellow     Clarity, UA clear     Glucose, UA negative     Bilirubin, UA negative     Ketones, UA negative     Spec Grav, UA 1.015     Blood, UA trace-lysed     pH, UA 7.5     Protein, UA negative     Urobilinogen, UA 0.2     Nitrite, UA negative     Leukocytes, UA small (1+)    POCT UA - MICROSCOPIC ONLY      Component Value Range   WBC, Ur, HPF, POC 0-2     RBC, urine, microscopic 0-3     Bacteria, U Microscopic negative     Mucus, UA negative     Epithelial cells, urine per micros 0-1     Crystals, Ur, HPF, POC negative     Casts, Ur, LPF, POC negative     Yeast, UA negative       EKG/XRAY:   Primary read interpreted by Dr. Conley Rolls at [facility]. EKG  Shows ST elevation in V2-6 which has been there prior , old EKG from 06/2011 and prior to that. BPM 78. Patient does not complain of CP.    ASSESSMENT/PLAN: Encounter Diagnoses  Name Primary?  . Dizziness Yes  . Fatigue    1. Dizziness-? UTI vs Vertigo. Orthostatic BP normal. Will get urine cx since UA + lueks 2. Cipro 250 mg BID x 3 days for UTI 3. Meclzine trial once done with Cipro prn for dizziness 4. Refill Lipitor 5. Labs pending: TSH, CMP     Ellie Bryand PHUONG, DO 02/18/2012 3:12 PM

## 2012-02-19 ENCOUNTER — Encounter: Payer: Self-pay | Admitting: Family Medicine

## 2012-02-20 LAB — URINE CULTURE
Colony Count: NO GROWTH
Organism ID, Bacteria: NO GROWTH

## 2012-03-01 ENCOUNTER — Encounter: Payer: Self-pay | Admitting: Family Medicine

## 2012-05-29 ENCOUNTER — Other Ambulatory Visit: Payer: Self-pay | Admitting: Family Medicine

## 2012-05-29 ENCOUNTER — Ambulatory Visit (INDEPENDENT_AMBULATORY_CARE_PROVIDER_SITE_OTHER): Payer: Managed Care, Other (non HMO) | Admitting: Family Medicine

## 2012-05-29 VITALS — BP 111/75 | HR 82 | Temp 97.5°F | Resp 16 | Ht 65.5 in | Wt 152.0 lb

## 2012-05-29 DIAGNOSIS — K6289 Other specified diseases of anus and rectum: Secondary | ICD-10-CM

## 2012-05-29 DIAGNOSIS — K645 Perianal venous thrombosis: Secondary | ICD-10-CM

## 2012-05-29 LAB — IFOBT (OCCULT BLOOD): IFOBT: NEGATIVE

## 2012-05-29 MED ORDER — HYDROCORTISONE ACE-PRAMOXINE 2.5-1 % RE CREA: TOPICAL_CREAM | Freq: Two times a day (BID) | RECTAL | Status: DC

## 2012-05-29 MED ORDER — HYDROCODONE-ACETAMINOPHEN 5-500 MG PO TABS: 1.0000 | ORAL_TABLET | ORAL | Status: AC | PRN

## 2012-05-29 NOTE — Patient Instructions (Signed)
Take Colace stool softener once or twice daily to keep stools soft Use MiraLax one dose daily until stools are a little loose, then just use it as necessary Drink lots of fluids Use the hydrocodone pain pills when necessary for pain relief We will try to make a referral for you to [facility] surgery. Someone will call you next week when that appointment will be. Return or go to the emergency room if severely worse.

## 2012-05-29 NOTE — Progress Notes (Signed)
Subjective: Subtle years ago the patient had problems with some internal hemorrhoids. They have subsequently subsided and not been giving him trouble. Over the last couple weeks she's been having pain, severe the last few days. It hurts to urinate or to move his bowels. When he moves his bowels they come out but then they go back in. He has not been bleeding.  Objective: Obvious distress from the hemorrhoidal pain. Abdomen is nonpainful. Digital rectal exam had a very tight sphincter. I could not feel of the hemorrhoids. He had no external hemorrhoids.  Assessment: Ten[place]mus Rectal pain Probably internal hemorrhoids  Plan: Hydrocodone for pain Stool softening Analpram-HC Refer to surgery. Return if worse.

## 2012-05-30 MED ORDER — PRAMOXINE-HC 1-1 % EX CREA: TOPICAL_CREAM | Freq: Three times a day (TID) | CUTANEOUS | Status: DC

## 2012-06-01 ENCOUNTER — Encounter (INDEPENDENT_AMBULATORY_CARE_PROVIDER_SITE_OTHER): Payer: Managed Care, Other (non HMO) | Admitting: General Surgery

## 2012-06-14 ENCOUNTER — Encounter (INDEPENDENT_AMBULATORY_CARE_PROVIDER_SITE_OTHER): Payer: Managed Care, Other (non HMO) | Admitting: General Surgery

## 2012-06-14 ENCOUNTER — Telehealth (INDEPENDENT_AMBULATORY_CARE_PROVIDER_SITE_OTHER): Payer: Self-pay

## 2012-06-14 NOTE — Telephone Encounter (Signed)
Returned my call and spoke to [place] about missing his appt today. The pt did not listen to his messages last week about the appt time and he thought the appt was on Tuesday 8/20. The pt is wanting to r/s the appt but I said the pt could see someone else in the group for his problem since he was a no show for Dr Dwain Sarna today per Dr Dwain Sarna. Elane Fritz will r/s the appt.

## 2012-06-14 NOTE — Telephone Encounter (Signed)
LMOM for pt to call me b/c Dr Dwain Sarna wants to know why he missed his appt on 8/19.

## 2012-06-15 ENCOUNTER — Ambulatory Visit (INDEPENDENT_AMBULATORY_CARE_PROVIDER_SITE_OTHER): Payer: Managed Care, Other (non HMO) | Admitting: Surgery

## 2012-06-15 ENCOUNTER — Encounter (INDEPENDENT_AMBULATORY_CARE_PROVIDER_SITE_OTHER): Payer: Self-pay | Admitting: Surgery

## 2012-06-15 VITALS — BP 122/80 | HR 72 | Temp 98.3°F | Resp 16 | Ht 65.0 in | Wt 154.4 lb

## 2012-06-15 DIAGNOSIS — R300 Dysuria: Secondary | ICD-10-CM | POA: Insufficient documentation

## 2012-06-15 DIAGNOSIS — K648 Other hemorrhoids: Secondary | ICD-10-CM

## 2012-06-15 MED ORDER — HYDROCORTISONE ACE-PRAMOXINE 2.5-1 % RE CREA: TOPICAL_CREAM | Freq: Four times a day (QID) | RECTAL | Status: AC

## 2012-06-15 NOTE — Patient Instructions (Addendum)

## 2012-06-15 NOTE — Progress Notes (Signed)
Subjective:     Patient ID: [name], male   DOB: 1960-11-03, 51 y.o.   MRN: [id number]  HPI  [name]  1961/10/03 [id number]  Patient Care Team: Romero Belling, MD as PCP - General  This patient is a 51 y.o.male who presents today for surgical evaluation at the request of Dr. Quintella Reichert.   Reason for evaluation: Painful hemorrhoids.  Patient is a pleasant gentleman originally from [place].  Start with hemorrhoids intermittently for several years.  Saw Dr. Cleophas Dunker with their group 2 years ago.  Bowel regimen with fiber was recommended.  It was recommended he come back in six weeks.  He did not.  Patient notes a few months ago he started having worsening rectal pain.  Especially with bowel movements.  Occasionally some blood when he wipes.  No severe hematochezia.  He normally has a bowel movement about every one to two days.  He claims he takes some MiraLAX every day.No prior treatments.  He recalls having some anorectal surgery done in an urgent clinic.  (?  ID of thrombosed hemorrhoid.  Patient denies was an abscess)  Patient has been trying to use narcotics for pain control.  Also Analpram cream.  Since he up to MiraLAX it seems to be getting better but he was concerned.  He also complains of some burning and discomfort with urination.  He does not recall any history of urinary tract infections in the past  Patient Active Problem List  Diagnosis  . THYROID NODULE  . THYROIDITIS  . HYPERCHOLESTEROLEMIA  . BIPOLAR DISORDER UNSPECIFIED  . DEPRESSION  . GLAUCOMA  . Internal hemorrhoids with pain  . NECK PAIN  . MYALGIA  . ARM PAIN, LEFT  . FATIGUE  . POLYDIPSIA  . HOARSENESS  . PALPITATIONS  . SHORTNESS OF BREATH  . CHEST PAIN  . Encounter for long-term (current) use of other medications  . Arthralgia  . Screening for prostate cancer  . Unspecified disorder of liver  . Dysuria    Past Medical History  Diagnosis Date  . ARM PAIN, LEFT 11/20/2009  . BIPOLAR DISORDER  UNSPECIFIED 12/13/2007  . CHEST PAIN 03/23/2009  . DEPRESSION 12/13/2007  . FATIGUE 03/23/2009  . GLAUCOMA 09/03/2009  . HEMORRHOIDS 09/03/2009  . HOARSENESS 09/03/2009  . HYPERCHOLESTEROLEMIA 03/11/2010  . MYALGIA 08/13/2009  . NECK PAIN 01/18/2009  . Palpitations 03/23/2009  . Polydipsia 01/24/2008  . Shortness of breath 03/23/2009  . THYROID NODULE 08/28/2008    benign on Bx  . THYROIDITIS 12/13/2007  . GERD (gastroesophageal reflux disease)   . Arthritis     Past Surgical History  Procedure Date  . Back surgery     x 3, lumbar  . Finger surgery     History   Social History  . Marital Status: Single    Spouse Name: N/A    Number of Children: N/A  . Years of Education: N/A   Occupational History  . Not on file.   Social History Main Topics  . Smoking status: Never Smoker   . Smokeless tobacco: Never Used  . Alcohol Use: No  . Drug Use: No  . Sexually Active: Not on file   Other Topics Concern  . 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 He is originally form [place]    Family History  Problem Relation Age of Onset  . Hypertension Other   .  Cancer Father     colon    Current Outpatient Prescriptions  Medication Sig Dispense Refill  . atorvastatin (LIPITOR) 40 MG tablet Take 1 tablet (40 mg total) by mouth daily.  30 tablet  5  . buPROPion (WELLBUTRIN SR) 150 MG 12 hr tablet Take 450 mg by mouth every morning.        Marland Kitchen HYDROcodone-acetaminophen (VICODIN) 5-500 MG per tablet       . hydrocortisone-pramoxine (ANALPRAM-HC) 2.5-1 % rectal cream USE TOPICALLY TO RECTAL AREA 2 (TWO) TIMES DAILY.  30 g  0  . mirtazapine (REMERON) 30 MG tablet Take 30 mg by mouth at bedtime.       . Multiple Vitamin (MULTIVITAMIN) tablet Take 1 tablet by mouth daily.        . phenylephrine (,USE FOR PREPARATION-H,) 0.25 % suppository Place 1 suppository rectally 2 (two) times daily.        . pramoxine-hydrocortisone (ANALPRAM HC) cream Apply topically 3 (three) times daily.  30 g  0  . naproxen sodium (ANAPROX) 220 MG tablet Take 220 mg by mouth 2 (two) times daily with a meal.         No Known Allergies  BP 122/80  Pulse 72  Temp 98.3 F (36.8 C) (Temporal)  Resp 16  Ht 5\' 5"  (1.651 m)  Wt 154 lb 6.4 oz (70.035 kg)  BMI 25.69 kg/m2  No results found.   Review of Systems  Constitutional: Negative for fever, chills and diaphoresis.  HENT: Negative for nosebleeds, sore throat, facial swelling, mouth sores, trouble swallowing and ear discharge.   Eyes: Negative for photophobia, discharge and visual disturbance.  Respiratory: Negative for choking, chest tightness, shortness of breath and stridor.   Cardiovascular: Negative for chest pain and palpitations.  Gastrointestinal: Negative for nausea, vomiting, abdominal pain, diarrhea, constipation, blood in stool, abdominal distention, anal bleeding and rectal pain.  Genitourinary: Positive for dysuria and frequency. Negative for urgency, hematuria, flank pain, enuresis, difficulty urinating and testicular pain.  Musculoskeletal: Negative for myalgias, back pain, arthralgias and gait problem.  Skin: Negative for color change, pallor, rash and wound.  Neurological: Negative for dizziness, speech difficulty, weakness, numbness and headaches.  Hematological: Negative for adenopathy. Does not bruise/bleed easily.  Psychiatric/Behavioral: Negative for hallucinations, confusion and agitation.       Objective:   Physical Exam  Constitutional: He is oriented to person, place, and time. He appears well-developed and well-nourished. No distress.  HENT:  Head: Normocephalic.  Mouth/Throat: Oropharynx is clear and moist. No oropharyngeal exudate.  Eyes: EOM are normal. Pupils are equal, round, and reactive to light. Right conjunctiva is injected. Left conjunctiva is injected. No scleral icterus.  Neck: Normal range of motion.  Neck supple. No tracheal deviation present.  Cardiovascular: Normal rate, regular rhythm and intact distal pulses.   Pulmonary/Chest: Effort normal and breath sounds normal. No respiratory distress.  Abdominal: Soft. He exhibits no distension. There is no tenderness. Hernia confirmed negative in the right inguinal area and confirmed negative in the left inguinal area.  Genitourinary: Penis normal. No penile tenderness.       Exam done with assistance of male Medical Assistant in the room.  Perianal skin clean with good hygiene.  No pruritis.  No external skin tags / hemorrhoids of significance.  No pilonidal disease.  No fissure.  No abscess/fistula.    Tolerates digital and anoscopic rectal exam.  Normal sphincter tone.  No rectal masses.  Hemorrhoidal piles enlarged & engorged on R posterior &  L lateral especially.  Musculoskeletal: Normal range of motion. He exhibits no tenderness.  Lymphadenopathy:    He has no cervical adenopathy.       Right: No inguinal adenopathy present.       Left: No inguinal adenopathy present.  Neurological: He is alert and oriented to person, place, and time. No cranial nerve deficit. He exhibits normal muscle tone. Coordination normal.  Skin: Skin is warm and dry. No rash noted. He is not diaphoretic. No erythema. No pallor.  Psychiatric: He has a normal mood and affect. His behavior is normal. Judgment and thought content normal.       Assessment:     Internal hemorrhoids with discomfort    Plan:     The anatomy & physiology of the anorectal region was discussed.  The pathophysiology of hemorrhoids and differential diagnosis was discussed.  Natural history progression  was discussed.   I stressed the importance of a bowel regimen to have daily soft bowel movements to minimize progression of disease.   Goal of one BM / day ideal.  Use of wet wipes, warm baths, avoiding straining, etc were emphasized.  Educational handouts further explaining the pathology,  treatment options, and bowel regimen were given as well.   The patient expressed understanding.  I went ahead and banded 2 of the hem piles:  The anatomy & physiology of the anorectal region was discussed.  The pathophysiology of hemorrhoids and differential diagnosis was discussed.  Natural history progression  was discussed.   I stressed the importance of a bowel regimen to have daily soft bowel movements to minimize progression of disease.     The patient's symptoms are not adequately controlled.  Therefore, I recommended banding to treat the hemorrhoids.  I went over the technique, risks, benefits, and alternatives.   Goals of post-operative recovery were discussed as well.  Questions were answered.  The patient expressed understanding & wished to proceed.  The patient was positioned in the lateral decubitus position.  Perianal & rectal examination was done.  Using anoscopy, I ligated the hemorrhoids above the dentate line with banding x 2 piles.  The left lateral band was more tenuous.  The patient tolerated the procedure well but did not want to retry another band.  Educational handouts further explaining the pathology, treatment options, and bowel regimen were given as well.   Hopefully by doubling his MiraLAX in the bandings, this should help control things.  If not, could reconsider with an injection versus THD hemorrhoidal ligation/pexy.  I do not know how well he would tolerate surgery given his sensitivity down there.  He will call me if things have not improved

## 2012-06-16 ENCOUNTER — Telehealth (INDEPENDENT_AMBULATORY_CARE_PROVIDER_SITE_OTHER): Payer: Self-pay

## 2012-06-16 LAB — URINALYSIS W MICROSCOPIC + REFLEX CULTURE
Bacteria, UA: NONE SEEN
Bilirubin Urine: NEGATIVE
Glucose, UA: NEGATIVE mg/dL
Hgb urine dipstick: NEGATIVE
Protein, ur: NEGATIVE mg/dL
Squamous Epithelial / LPF: NONE SEEN
Urobilinogen, UA: 0.2 mg/dL (ref 0.0–1.0)

## 2012-06-16 NOTE — Telephone Encounter (Signed)
LMOM notifying pt that his UA was normal per Dr Michaell Cowing.  The pt advised if he continues to have problems with urinating then he may need to see his PCP or make an appt with a urologist.

## 2012-07-20 ENCOUNTER — Other Ambulatory Visit: Payer: Self-pay | Admitting: Physician Assistant

## 2012-07-20 ENCOUNTER — Other Ambulatory Visit: Payer: Self-pay

## 2012-07-26 ENCOUNTER — Ambulatory Visit: Payer: Managed Care, Other (non HMO) | Admitting: Endocrinology

## 2012-08-10 ENCOUNTER — Ambulatory Visit: Payer: Managed Care, Other (non HMO) | Admitting: Endocrinology

## 2012-08-12 ENCOUNTER — Other Ambulatory Visit: Payer: Self-pay | Admitting: Physician Assistant

## 2012-08-27 ENCOUNTER — Other Ambulatory Visit: Payer: Self-pay | Admitting: Family Medicine

## 2012-08-30 ENCOUNTER — Telehealth (INDEPENDENT_AMBULATORY_CARE_PROVIDER_SITE_OTHER): Payer: Self-pay | Admitting: General Surgery

## 2012-08-30 NOTE — Telephone Encounter (Signed)
RX for hydrocodone received from [facility] on patient. After reviewing notes- patient was never given RX from Dr Michaell Cowing- this was from Dr Alwyn Ren. Denied RX refill. Patient to contact Dr Alwyn Ren or get an appt with [place] if no better.

## 2012-08-31 ENCOUNTER — Ambulatory Visit: Payer: Managed Care, Other (non HMO) | Admitting: Endocrinology

## 2012-08-31 ENCOUNTER — Other Ambulatory Visit: Payer: Self-pay | Admitting: Family Medicine

## 2012-09-05 ENCOUNTER — Other Ambulatory Visit: Payer: Self-pay | Admitting: Physician Assistant

## 2012-09-06 ENCOUNTER — Ambulatory Visit (INDEPENDENT_AMBULATORY_CARE_PROVIDER_SITE_OTHER): Payer: Managed Care, Other (non HMO) | Admitting: Surgery

## 2012-09-06 ENCOUNTER — Encounter (INDEPENDENT_AMBULATORY_CARE_PROVIDER_SITE_OTHER): Payer: Self-pay | Admitting: Surgery

## 2012-09-06 VITALS — BP 110/62 | HR 84 | Temp 97.6°F | Resp 16 | Ht 65.0 in | Wt 152.4 lb

## 2012-09-06 DIAGNOSIS — K648 Other hemorrhoids: Secondary | ICD-10-CM

## 2012-09-06 DIAGNOSIS — K602 Anal fissure, unspecified: Secondary | ICD-10-CM

## 2012-09-06 MED ORDER — AMBULATORY NON FORMULARY MEDICATION: 1.0000 "application " | Freq: Four times a day (QID) | Status: DC

## 2012-09-06 NOTE — Progress Notes (Signed)
Subjective:     Patient ID: [name], male   DOB: 1961/07/11, 51 y.o.   MRN: [id number]  HPI   [name]  Apr 20, 1961 [id number]  Patient Care Team: Romero Belling, MD as PCP - General  This patient is a 51 y.o.male who presents today for surgical evaluation at the request of Dr. Quintella Reichert.   Reason for evaluation: Rectal pain s/p banding of int hemorrhoids Aug 2013  Patient is a pleasant gentleman originally from [place].  Struggled with hemorrhoids intermittently for several years.  Saw Dr. Dwain Sarna with their group 2 years ago.  Bowel regimen with fiber was recommended.  It was recommended he come back in six weeks.  He did not.  Patient started having worsening rectal pain especially with bowel movements.  Occasionally had some blood when he wiped.  I found some irritated internal hemorrhoids.  I banded them in August.  He had difficulty with urinating the next day.    We did not here from him for over 2.5 months until last week.  He claims he has struggled with pain ever since the banding.  Has used creams.  Cannot recall which ones.  They usually help the first few weeks, then they stopped working.  He claims he was having pain and wished to be seen but we would not fit him in.  I do not really have any record of that.  I apologized.  He normally has a bowel movement about every day with MiraLAX every day.  Worse discomfort is with a bowel movement.  Now has chronic soreness.  No bleeding now.  Nothing prolapses out with defecation  Patient Active Problem List  Diagnosis  . THYROID NODULE  . THYROIDITIS  . HYPERCHOLESTEROLEMIA  . BIPOLAR DISORDER UNSPECIFIED  . DEPRESSION  . GLAUCOMA  . Internal hemorrhoids with pain  . NECK PAIN  . MYALGIA  . ARM PAIN, LEFT  . FATIGUE  . POLYDIPSIA  . HOARSENESS  . PALPITATIONS  . SHORTNESS OF BREATH  . CHEST PAIN  . Encounter for long-term (current) use of other medications  . Arthralgia  . Screening for prostate cancer  . Unspecified  disorder of liver  . Dysuria  . Anal fissure    Past Medical History  Diagnosis Date  . ARM PAIN, LEFT 11/20/2009  . BIPOLAR DISORDER UNSPECIFIED 12/13/2007  . CHEST PAIN 03/23/2009  . DEPRESSION 12/13/2007  . FATIGUE 03/23/2009  . GLAUCOMA 09/03/2009  . HEMORRHOIDS 09/03/2009  . HOARSENESS 09/03/2009  . HYPERCHOLESTEROLEMIA 03/11/2010  . MYALGIA 08/13/2009  . NECK PAIN 01/18/2009  . Palpitations 03/23/2009  . Polydipsia 01/24/2008  . Shortness of breath 03/23/2009  . THYROID NODULE 08/28/2008    benign on Bx  . THYROIDITIS 12/13/2007  . GERD (gastroesophageal reflux disease)   . Arthritis     Past Surgical History  Procedure Date  . Back surgery     x 3, lumbar  . Finger surgery     History   Social History  . Marital Status: Single    Spouse Name: N/A    Number of Children: N/A  . Years of Education: N/A   Occupational History  . Not on file.   Social History Main Topics  . Smoking status: Never Smoker   . Smokeless tobacco: Never Used  . Alcohol Use: No  . Drug Use: No  . Sexually Active: Not on file   Other Topics Concern  . 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 He is originally form [place]    Family History  Problem Relation Age of Onset  . Hypertension Other   . Cancer Father     colon    Current Outpatient Prescriptions  Medication Sig Dispense Refill  . atorvastatin (LIPITOR) 40 MG tablet TAKE 1 TABLET (40 MG TOTAL) BY MOUTH DAILY.  30 tablet  0  . buPROPion (WELLBUTRIN SR) 150 MG 12 hr tablet Take 450 mg by mouth every morning.        . mirtazapine (REMERON) 30 MG tablet Take 30 mg by mouth at bedtime.       . Multiple Vitamin (MULTIVITAMIN) tablet Take 1 tablet by mouth daily.        . phenylephrine (,USE FOR PREPARATION-H,) 0.25 % suppository Place 1 suppository rectally 2 (two) times daily.      Marland Kitchen PRAMCORT cream APPLY TOPICALLY 3 (THREE)  TIMES DAILY.  30 g  0  . AMBULATORY NON FORMULARY MEDICATION Place 1 application rectally 4 (four) times daily. Diltiazem 2% compounded suspension.  1 Tube  2  . naproxen sodium (ANAPROX) 220 MG tablet Take 220 mg by mouth 2 (two) times daily with a meal.         No Known Allergies  BP 110/62  Pulse 84  Temp 97.6 F (36.4 C) (Temporal)  Resp 16  Ht 5\' 5"  (1.651 m)  Wt 152 lb 6.4 oz (69.128 kg)  BMI 25.36 kg/m2  No results found.   Review of Systems  Constitutional: Negative for fever, chills and diaphoresis.  HENT: Negative for nosebleeds, sore throat, facial swelling, mouth sores, trouble swallowing and ear discharge.   Eyes: Negative for photophobia, discharge and visual disturbance.  Respiratory: Negative for choking, chest tightness, shortness of breath and stridor.   Cardiovascular: Negative for chest pain and palpitations.  Gastrointestinal: Negative for nausea, vomiting, abdominal pain, diarrhea, constipation, blood in stool, abdominal distention, anal bleeding and rectal pain.  Genitourinary: Positive for dysuria and frequency. Negative for urgency, hematuria, flank pain, enuresis, difficulty urinating and testicular pain.  Musculoskeletal: Negative for myalgias, back pain, arthralgias and gait problem.  Skin: Negative for color change, pallor, rash and wound.  Neurological: Negative for dizziness, speech difficulty, weakness, numbness and headaches.  Hematological: Negative for adenopathy. Does not bruise/bleed easily.  Psychiatric/Behavioral: Negative for hallucinations, confusion and agitation.       Objective:   Physical Exam  Constitutional: He is oriented to person, place, and time. He appears well-developed and well-nourished. No distress.  HENT:  Head: Normocephalic.  Mouth/Throat: Oropharynx is clear and moist. No oropharyngeal exudate.  Eyes: EOM are normal. Pupils are equal, round, and reactive to light. Right conjunctiva is injected. Left conjunctiva is  injected. No scleral icterus.  Neck: Normal range of motion. Neck supple. No tracheal deviation present.  Cardiovascular: Normal rate, regular rhythm and intact distal pulses.   Pulmonary/Chest: Effort normal and breath sounds normal. No respiratory distress.  Abdominal: Soft. He exhibits no distension. There is no tenderness. Hernia confirmed negative in the right inguinal area and confirmed negative in the left inguinal area.  Genitourinary: Penis normal. No penile tenderness.       Exam done with assistance of male Medical Assistant in the room.  Perianal skin clean with good hygiene.  No pruritis.  No external skin tags / hemorrhoids of significance.  No pilonidal disease.    Increased sphincter tone.   Too  tight to tolerate digital rectal examination.  Very sensitive posterior midline anal sphincter.  I cannot definitely see a fissure, but it is suspicious.  No abscess/fistula obvious.  No purulent drainage    Musculoskeletal: Normal range of motion. He exhibits no tenderness.  Lymphadenopathy:    He has no cervical adenopathy.       Right: No inguinal adenopathy present.       Left: No inguinal adenopathy present.  Neurological: He is alert and oriented to person, place, and time. No cranial nerve deficit. He exhibits normal muscle tone. Coordination normal.  Skin: Skin is warm and dry. No rash noted. He is not diaphoretic. No erythema. No pallor.  Psychiatric: He has a normal mood and affect. His behavior is normal. Judgment and thought content normal.       Assessment:     Sharp posterior anal pain, suspicious for fissure.    Plan:     The anatomy & physiology of the anorectal region was discussed.  The pathophysiology of anal fissure and differential diagnosis was discussed.  Natural history progression  was discussed.   I stressed the importance of a bowel regimen to have daily soft bowel movements to minimize progression of disease.   I discussed the use of warm soaks &   muscle relaxant, diltiazem, to help the anal sphincter relax, allow the spasming to stop, and help the tear/fissure to heal.  He was worried about the cost of the prescription.  I noted the only other alternative for surgery at this time.  I would like to hold off on that.  If non-operative treatment does not heal the fissure and resolve the pain in a few weeks, I would recommend examination under anesthesia for better examination to confirm the diagnosis and treat by lateral internal sphincterotomy to allow the fissure to heal.  Technique, benefits, alternatives discussed.  Risks such as bleeding, pain, incontinence, recurrence, heart attack, death, and other risks were discussed.    Educational handouts further explaining the pathology, treatment options, and bowel regimen were given as well.  The patient expressed understanding & will follow up in 3 weeks.  If the pain does not resolve in a few weeks or worsens, he should proceed with surgery To examine him with him fully relax I can get a better sense of anatomy and provide a more aggressive treatment.  I stressed to him the importance of trying to reach [place] if things do not get better.  I apologized we had not given him good feedback, although we have no documentation he really called [place].  I wonder if he is confused & was calling his primary care physician.  I spent extra time & reexplained things.  I gave him educational handouts.  Nonetheless, we will try and call him later this week.

## 2012-09-06 NOTE — Patient Instructions (Addendum)
Anal Fissure, Adult An anal fissure is a small tear or crack in the skin around the opening of the butt (anus).Bleeding from the tear or crack usually stops on its own within a few minutes. The bleeding may happen every time you poop until the tear or crack heals. HOME CARE  Eat lots of fruit, whole grains, and vegetables. Avoid foods like bananas and dairy products. These foods can make it hard to poop.  Take a warm water bath (sitz bath) as told by your doctor.  Drink enough fluids to keep your pee (urine) clear or pale yellow.  Only take medicines as told by your doctor. Do not take aspirin.  Do not use numbing creams or hydrocortisone cream on the area. These creams can slow healing. GET HELP RIGHT AWAY IF:  Your tear or crack is not healed in 3 days.  You have more bleeding.  You have a fever.  You have watery poop (diarrhea) mixed with blood.  You have pain.  You are getting worse, not better. MAKE SURE YOU:   Understand these instructions.  Will watch your condition.  Will get help right away if you are not doing well or get worse. Document Released: 06/11/2011 Document Revised: 01/05/2012 Document Reviewed: 06/11/2011 [facility] Patient Information 2013 [place], [place].  GETTING TO GOOD BOWEL HEALTH. Irregular bowel habits such as constipation and diarrhea can lead to many problems over time.  Having one soft bowel movement a day is the most important way to prevent further problems.  The anorectal canal is designed to handle stretching and feces to safely manage our ability to get rid of solid waste (feces, poop, stool) out of our body.  BUT, hard constipated stools can act like ripping concrete bricks and diarrhea can be a burning fire to this very sensitive area of our body, causing inflamed hemorrhoids, anal fissures, increasing risk is perirectal abscesses, abdominal pain/bloating, an making irritable bowel worse.     The goal: ONE SOFT BOWEL MOVEMENT A DAY!  To  have soft, regular bowel movements:    Drink at least 8 tall glasses of water a day.     Take plenty of fiber.  Fiber is the undigested part of plant food that passes into the colon, acting s "natures broom" to encourage bowel motility and movement.  Fiber can absorb and hold large amounts of water. This results in a larger, bulkier stool, which is soft and easier to pass. Work gradually over several weeks up to 6 servings a day of fiber (25g a day even more if needed) in the form of: o Vegetables -- Root (potatoes, carrots, turnips), leafy green (lettuce, salad greens, celery, spinach), or cooked high residue (cabbage, broccoli, etc) o Fruit -- Fresh (unpeeled skin & pulp), Dried (prunes, apricots, cherries, etc ),  or stewed ( applesauce)  o Whole grain breads, pasta, etc (whole wheat)  o Bran cereals    Bulking Agents -- This type of water-retaining fiber generally is easily obtained each day by one of the following:  o Psyllium bran -- The psyllium plant is remarkable because its ground seeds can retain so much water. This product is available as Metamucil, Konsyl, Effersyllium, Per Diem Fiber, or the less expensive generic preparation in drug and health food stores. Although labeled a laxative, it really is not a laxative.  o Methylcellulose -- This is another fiber derived from wood which also retains water. It is available as Citrucel. o Polyethylene Glycol - and "artificial" fiber commonly called  Miralax or Glycolax.  It is helpful for people with gassy or bloated feelings with regular fiber o Flax Seed - a less gassy fiber than psyllium   No reading or other relaxing activity while on the toilet. If bowel movements take longer than 5 minutes, you are too constipated   AVOID CONSTIPATION.  High fiber and water intake usually takes care of this.  Sometimes a laxative is needed to stimulate more frequent bowel movements, but    Laxatives are not a good long-term solution as it can wear the colon  out. o Osmotics (Milk of Magnesia, Fleets phosphosoda, Magnesium citrate, MiraLax, GoLytely) are safer than  o Stimulants (Senokot, Castor Oil, Dulcolax, Ex Lax)    o Do not take laxatives for more than 7days in a row.    IF SEVERELY CONSTIPATED, try a Bowel Retraining Program: o Do not use laxatives.  o Eat a diet high in roughage, such as bran cereals and leafy vegetables.  o Drink six (6) ounces of prune or apricot juice each morning.  o Eat two (2) large servings of stewed fruit each day.  o Take one (1) heaping tablespoon of a psyllium-based bulking agent twice a day. Use sugar-free sweetener when possible to avoid excessive calories.  o Eat a normal breakfast.  o Set aside 15 minutes after breakfast to sit on the toilet, but do not strain to have a bowel movement.  o If you do not have a bowel movement by the third day, use an enema and repeat the above steps.    Controlling diarrhea o Switch to liquids and simpler foods for a few days to avoid stressing your intestines further. o Avoid dairy products (especially milk & ice cream) for a short time.  The intestines often can lose the ability to digest lactose when stressed. o Avoid foods that cause gassiness or bloating.  Typical foods include beans and other legumes, cabbage, broccoli, and dairy foods.  Every person has some sensitivity to other foods, so listen to our body and avoid those foods that trigger problems for you. o Adding fiber (Citrucel, Metamucil, psyllium, Miralax) gradually can help thicken stools by absorbing excess fluid and retrain the intestines to act more normally.  Slowly increase the dose over a few weeks.  Too much fiber too soon can backfire and cause cramping & bloating. o Probiotics (such as active yogurt, Align, etc) may help repopulate the intestines and colon with normal bacteria and calm down a sensitive digestive tract.  Most studies show it to be of mild help, though, and such products can be  costly. o Medicines:   Bismuth subsalicylate (ex. Kayopectate, Pepto Bismol) every 30 minutes for up to 6 doses can help control diarrhea.  Avoid if pregnant.   Loperamide (Immodium) can slow down diarrhea.  Start with two tablets (4mg  total) first and then try one tablet every 6 hours.  Avoid if you are having fevers or severe pain.  If you are not better or start feeling worse, stop all medicines and call your doctor for advice o Call your doctor if you are getting worse or not better.  Sometimes further testing (cultures, endoscopy, X-ray studies, bloodwork, etc) may be needed to help diagnose and treat the cause of the diarrhea. o

## 2012-09-09 ENCOUNTER — Telehealth (INDEPENDENT_AMBULATORY_CARE_PROVIDER_SITE_OTHER): Payer: Self-pay | Admitting: General Surgery

## 2012-09-09 NOTE — Telephone Encounter (Signed)
Called to check on patient today to make sure he is doing okay. Left message on machine for patient to call back and ask for me.

## 2012-09-13 NOTE — Telephone Encounter (Signed)
Called again to make sure patient was doing okay after being seen. LMOM for patient to call back.

## 2012-09-14 NOTE — Telephone Encounter (Signed)
My mistake - diltiazem.

## 2012-09-14 NOTE — Telephone Encounter (Signed)
Patient called back and stated he has been using Nifedipine Ointment 4 x daily and pain is no better. Made him aware I would let Dr Michaell Cowing know.

## 2012-09-14 NOTE — Telephone Encounter (Signed)
?  Where did he get the nifedipine ointment?  Schedule EUA/surgery

## 2012-09-16 NOTE — Telephone Encounter (Signed)
Please schedule surgery - EUA to see if he has a fissure or abscess.  I had discussed this with him the last visit.  If he has more questions, let me know, and I can talk to him some more

## 2012-09-21 ENCOUNTER — Other Ambulatory Visit (INDEPENDENT_AMBULATORY_CARE_PROVIDER_SITE_OTHER): Payer: Self-pay | Admitting: Surgery

## 2012-09-21 NOTE — Telephone Encounter (Signed)
Left message on machine for patient to call back and ask for me. Orders written for patient to have surgery. Patient needs informed of rectal prep with no enema.

## 2012-10-04 ENCOUNTER — Ambulatory Visit (INDEPENDENT_AMBULATORY_CARE_PROVIDER_SITE_OTHER): Payer: Managed Care, Other (non HMO) | Admitting: Endocrinology

## 2012-10-04 ENCOUNTER — Encounter: Payer: Self-pay | Admitting: Endocrinology

## 2012-10-04 VITALS — BP 108/56 | HR 86 | Temp 97.3°F | Resp 16 | Ht 65.0 in | Wt 155.0 lb

## 2012-10-04 DIAGNOSIS — Z125 Encounter for screening for malignant neoplasm of prostate: Secondary | ICD-10-CM

## 2012-10-04 DIAGNOSIS — Z0000 Encounter for general adult medical examination without abnormal findings: Secondary | ICD-10-CM

## 2012-10-04 DIAGNOSIS — S5000XA Contusion of unspecified elbow, initial encounter: Secondary | ICD-10-CM

## 2012-10-04 DIAGNOSIS — K769 Liver disease, unspecified: Secondary | ICD-10-CM

## 2012-10-04 DIAGNOSIS — E7800 Pure hypercholesterolemia, unspecified: Secondary | ICD-10-CM

## 2012-10-04 DIAGNOSIS — E069 Thyroiditis, unspecified: Secondary | ICD-10-CM

## 2012-10-04 DIAGNOSIS — S5001XA Contusion of right elbow, initial encounter: Secondary | ICD-10-CM

## 2012-10-04 DIAGNOSIS — Z79899 Other long term (current) drug therapy: Secondary | ICD-10-CM

## 2012-10-04 LAB — CBC WITH DIFFERENTIAL/PLATELET
Hemoglobin: 14.8 g/dL (ref 13.0–17.0)
Lymphs Abs: 3.1 10*3/uL (ref 0.7–4.0)
Monocytes Relative: 8 % (ref 3–12)
Neutro Abs: 1.5 10*3/uL — ABNORMAL LOW (ref 1.7–7.7)
Neutrophils Relative %: 30 % — ABNORMAL LOW (ref 43–77)
Platelets: 270 10*3/uL (ref 150–400)
RBC: 5.18 MIL/uL (ref 4.22–5.81)
WBC: 5.1 10*3/uL (ref 4.0–10.5)

## 2012-10-04 NOTE — Patient Instructions (Addendum)
please consider these measures for your health:  minimize alcohol.  do not use tobacco products.  have a colonoscopy at least every 10 years from age 51.  keep firearms safely stored.  always use seat belts.  have working smoke alarms in your home.  see an eye doctor and dentist regularly.  never drive under the influence of alcohol or drugs (including prescription drugs).   Please return in January of 2015. Please do your blood tests on or after 10/12/12. X-rays requested for you today.  We'll contact you with each of your test results.

## 2012-10-04 NOTE — Progress Notes (Signed)
Subjective:    Patient ID: [name], male    DOB: 08/10/1961, 51 y.o.   MRN: [id number]  HPI  here for regular wellness examination.  He's feeling pretty well in general, and says chronic med probs are stable, except as noted below Past Medical History  Diagnosis Date  . ARM PAIN, LEFT 11/20/2009  . BIPOLAR DISORDER UNSPECIFIED 12/13/2007  . CHEST PAIN 03/23/2009  . DEPRESSION 12/13/2007  . FATIGUE 03/23/2009  . GLAUCOMA 09/03/2009  . HEMORRHOIDS 09/03/2009  . HOARSENESS 09/03/2009  . HYPERCHOLESTEROLEMIA 03/11/2010  . MYALGIA 08/13/2009  . NECK PAIN 01/18/2009  . Palpitations 03/23/2009  . Polydipsia 01/24/2008  . Shortness of breath 03/23/2009  . THYROID NODULE 08/28/2008    benign on Bx  . THYROIDITIS 12/13/2007  . GERD (gastroesophageal reflux disease)   . Arthritis     Past Surgical History  Procedure Date  . Back surgery     x 3, lumbar  . Finger surgery     History   Social History  . Marital Status: Single    Spouse Name: N/A    Number of Children: N/A  . Years of Education: N/A   Occupational History  . Not on file.   Social History Main Topics  . Smoking status: Never Smoker   . Smokeless tobacco: Never Used  . Alcohol Use: No  . Drug Use: No  . Sexually Active: Not on file   Other Topics Concern  . 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 He is originally form [place]    Current Outpatient Prescriptions on File Prior to Visit  Medication Sig Dispense Refill  . AMBULATORY NON FORMULARY MEDICATION Place 1 application rectally 4 (four) times daily. Diltiazem 2% compounded suspension.  1 Tube  2  . atorvastatin (LIPITOR) 40 MG tablet TAKE 1 TABLET (40 MG TOTAL) BY MOUTH DAILY.  30 tablet  0  . buPROPion (WELLBUTRIN SR) 150 MG 12 hr tablet Take 450 mg by mouth every morning.        . mirtazapine (REMERON) 30 MG tablet Take 30 mg by mouth at bedtime.        . Multiple Vitamin (MULTIVITAMIN) tablet Take 1 tablet by mouth daily.        . phenylephrine (,USE FOR PREPARATION-H,) 0.25 % suppository Place 1 suppository rectally 2 (two) times daily.      Marland Kitchen PRAMCORT cream APPLY TOPICALLY 3 (THREE) TIMES DAILY.  30 g  0  . naproxen sodium (ANAPROX) 220 MG tablet Take 220 mg by mouth 2 (two) times daily with a meal.        No Known Allergies  Family History  Problem Relation Age of Onset  . Hypertension Other   . Cancer Father     colon    BP 108/56  Pulse 86  Temp 97.3 F (36.3 C) (Oral)  Resp 16  Ht 5\' 5"  (1.651 m)  Wt 155 lb (70.308 kg)  BMI 25.79 kg/m2  SpO2 98%      Review of Systems  Constitutional: Negative for unexpected weight change.  HENT: Negative for hearing loss.   Eyes: Negative for visual disturbance.  Respiratory: Negative for shortness of breath.   Cardiovascular: Negative for chest pain.  Gastrointestinal: Negative for abdominal pain.  Genitourinary: Negative for hematuria and difficulty urinating.  Musculoskeletal: Negative for back pain.  Skin: Negative for rash.  Neurological:  Negative for syncope.  Hematological: Does not bruise/bleed easily.  Psychiatric/Behavioral:       Depression is well-controlled        Objective:   Physical Exam VS: see vs page GEN: no distress HEAD: head: no deformity eyes: no periorbital swelling, no proptosis external nose and ears are normal mouth: no lesion seen NECK: supple, thyroid is not enlarged CHEST WALL: no deformity. LUNGS: clear to auscultation. BREASTS:  No gynecomastia. CV: reg rate and rhythm, no murmur. ABD: abdomen is soft, nontender.  no hepatosplenomegaly.  not distended.  no hernia.   RECTAL/PROSTATE:  declined MUSCULOSKELETAL: muscle bulk and strength are grossly normal.  no obvious joint swelling.  gait is normal and steady EXTEMITIES: no deformity.  no ulcer on the feet.  feet are of normal color and temp.  no edema PULSES: dorsalis pedis  intact bilat.  no carotid bruit NEURO:  cn 2-12 grossly intact.   readily moves all 4's.  sensation is intact to touch on the feet SKIN:  Normal texture and temperature.  No rash or suspicious lesion is visible.   NODES:  None palpable at the neck PSYCH: alert, oriented x3.  Does not appear anxious nor depressed.       Assessment & Plan:  Wellness visit today, with problems stable, except as noted.   SEPARATE EVALUATION FOLLOWS--EACH PROBLEM HERE IS NEW, NOT RESPONDING TO TREATMENT, OR POSES SIGNIFICANT RISK TO THE PATIENT'S HEALTH: HISTORY OF THE PRESENT ILLNESS: Pt was incidentally noted on MRI to have a thyroid nodule in 2008; bx was benign.  pt states he feels well in general.  He does not notice the goiter.  Pt states 2 days of moderate pain at the right elbow.  It started in the context of a fall at home.  No assoc numbness. PAST MEDICAL HISTORY reviewed and up to date today REVIEW OF SYSTEMS: Denies fever PHYSICAL EXAMINATION: VITAL SIGNS:  See vs page GENERAL: no distress Right elbow: normal to my exam IMPRESSION: Elbow contusion, new. PLAN: See instruction page

## 2012-10-05 LAB — URINALYSIS, ROUTINE W REFLEX MICROSCOPIC
Glucose, UA: NEGATIVE mg/dL
Leukocytes, UA: NEGATIVE
Nitrite: NEGATIVE
Protein, ur: NEGATIVE mg/dL
pH: 7 (ref 5.0–8.0)

## 2012-10-05 LAB — HEPATIC FUNCTION PANEL
Alkaline Phosphatase: 99 U/L (ref 39–117)
Bilirubin, Direct: 0.1 mg/dL (ref 0.0–0.3)
Indirect Bilirubin: 0.5 mg/dL (ref 0.0–0.9)

## 2012-10-05 LAB — BASIC METABOLIC PANEL
BUN: 14 mg/dL (ref 6–23)
CO2: 28 mEq/L (ref 19–32)
Glucose, Bld: 85 mg/dL (ref 70–99)
Potassium: 4 mEq/L (ref 3.5–5.3)
Sodium: 141 mEq/L (ref 135–145)

## 2012-10-05 LAB — TSH: TSH: 2.164 u[IU]/mL (ref 0.350–4.500)

## 2012-10-05 LAB — LIPID PANEL
Cholesterol: 139 mg/dL (ref 0–200)
HDL: 82 mg/dL (ref >39)
Total CHOL/HDL Ratio: 1.7 Ratio

## 2012-10-07 ENCOUNTER — Telehealth: Payer: Self-pay | Admitting: *Deleted

## 2012-10-07 NOTE — Telephone Encounter (Signed)
Message copied by Elnora Morrison on Thu Oct 07, 2012  4:31 PM ------      Message from: Romero Belling      Created: Wed Oct 06, 2012  7:45 AM       please call patient:      Normal--good      Please continue the same medications      i'll see you next time.

## 2012-10-07 NOTE — Telephone Encounter (Signed)
LEFT MESSAGE ON CELL # OF LAB RESULTS NORMAL AND GOOD PER DR. ELLISON AND TO CONTINUE MEDICATIONS.

## 2012-10-14 ENCOUNTER — Telehealth: Payer: Self-pay | Admitting: *Deleted

## 2012-10-14 NOTE — Telephone Encounter (Signed)
Left message on home voice mail # of normal lab results per Dr. Everardo All and to continue same medications.

## 2012-11-15 ENCOUNTER — Other Ambulatory Visit: Payer: Self-pay | Admitting: Physician Assistant

## 2012-11-17 ENCOUNTER — Other Ambulatory Visit: Payer: Self-pay | Admitting: Physician Assistant

## 2012-11-23 ENCOUNTER — Telehealth: Payer: Self-pay

## 2012-11-23 ENCOUNTER — Other Ambulatory Visit: Payer: Self-pay | Admitting: Physician Assistant

## 2012-11-23 NOTE — Telephone Encounter (Signed)
He did labs last month, and does not need to redo.   Please refill lipitor prn

## 2012-11-23 NOTE — Telephone Encounter (Signed)
Pt requesting refill of his lipitor.  Also pt thought he was supposed to have some additional lab work, but when he got to the lab there was no order.

## 2012-11-24 MED ORDER — ATORVASTATIN CALCIUM 40 MG PO TABS: 40.0000 mg | ORAL_TABLET | Freq: Every day | ORAL | Status: DC

## 2012-11-24 NOTE — Telephone Encounter (Signed)
LEFT MESSAGE ON PATIENT HOME # OF NO NEED FOR LAB WORK. WAS DONE LAST MONTH. REFILL Rx FOR LIPITOR SENT TO [facility].

## 2012-12-04 ENCOUNTER — Ambulatory Visit (INDEPENDENT_AMBULATORY_CARE_PROVIDER_SITE_OTHER): Payer: Managed Care, Other (non HMO) | Admitting: Family Medicine

## 2012-12-04 VITALS — BP 111/76 | HR 72 | Temp 97.5°F | Resp 16 | Ht 66.0 in | Wt 154.2 lb

## 2012-12-04 DIAGNOSIS — M545 Low back pain, unspecified: Secondary | ICD-10-CM

## 2012-12-04 DIAGNOSIS — M5380 Other specified dorsopathies, site unspecified: Secondary | ICD-10-CM

## 2012-12-04 DIAGNOSIS — M62830 Muscle spasm of back: Secondary | ICD-10-CM

## 2012-12-04 MED ORDER — CYCLOBENZAPRINE HCL 10 MG PO TABS: 10.0000 mg | ORAL_TABLET | Freq: Three times a day (TID) | ORAL | Status: DC | PRN

## 2012-12-04 MED ORDER — PREDNISONE 20 MG PO TABS: ORAL_TABLET | ORAL | Status: DC

## 2012-12-04 MED ORDER — HYDROCODONE-ACETAMINOPHEN 5-500 MG PO TABS: 1.0000 | ORAL_TABLET | ORAL | Status: DC | PRN

## 2012-12-04 NOTE — Progress Notes (Signed)
Subjective: Patient had back surgery 7 years ago with a fusion of his lumbar spine. He's been doing pretty well. Last night he was cleaning the toilet at work. He said it was a mass in he had to be bent over it for a long time cleaning. When he stood back up his back spasmed up terribly. When he got home it was even worse. This morning he can hardly move. He is having quite severe pain in his left and mid low back. No frank radiation of the pain. He has not taken anything for it. He can lie down, but if he sits for a few minutes it starts tightening up even worse.  Objective: Patient is in obvious distress from the pain. It hurts to move at all. Straight leg raising test on the right caused contralateral pain on the left low back at about 30. Straight leg raise test on the left caused severe pain in the back at about 20. He is able to ambulate. He is very tight in the muscles of his low back, with a lot of tenderness in the low lumbar and left low paraspinous muscles.  Assessment: Lumbar strain Remote history of lumbar surgery  Plan: Muscle relaxant Cysts face Pain medication After his prednisone dose is down to the fourth day or so he can start taking 2 Aleve twice daily if needed. He was to return as this there. I did advise him to speak to his boss about getting this followed a workers compensation. If it is a workers copy will need to be rechecked before being cleared for returning to work next week, but we have given him the next few days off.

## 2012-12-04 NOTE — Patient Instructions (Addendum)

## 2013-03-22 ENCOUNTER — Other Ambulatory Visit: Payer: Self-pay | Admitting: Physician Assistant

## 2013-06-14 ENCOUNTER — Ambulatory Visit (INDEPENDENT_AMBULATORY_CARE_PROVIDER_SITE_OTHER): Payer: Managed Care, Other (non HMO) | Admitting: Endocrinology

## 2013-06-14 ENCOUNTER — Encounter: Payer: Self-pay | Admitting: Endocrinology

## 2013-06-14 VITALS — BP 130/70 | HR 82 | Ht 65.0 in | Wt 152.0 lb

## 2013-06-14 DIAGNOSIS — E041 Nontoxic single thyroid nodule: Secondary | ICD-10-CM

## 2013-06-14 DIAGNOSIS — S5001XA Contusion of right elbow, initial encounter: Secondary | ICD-10-CM

## 2013-06-14 DIAGNOSIS — S5000XA Contusion of unspecified elbow, initial encounter: Secondary | ICD-10-CM

## 2013-06-14 DIAGNOSIS — Z119 Encounter for screening for infectious and parasitic diseases, unspecified: Secondary | ICD-10-CM

## 2013-06-14 MED ORDER — ATORVASTATIN CALCIUM 40 MG PO TABS: 40.0000 mg | ORAL_TABLET | Freq: Every day | ORAL | Status: DC

## 2013-06-14 NOTE — Progress Notes (Signed)
  Subjective:    Patient ID: [name], male    DOB: 1961/10/14, 52 y.o.   MRN: [id number]  HPI Pt had bx of a right thyroid nodule in 2008--benign.  Serial [place] have been unchanged since then.  He has slight assoc pain. Past Medical History  Diagnosis Date  . ARM PAIN, LEFT 11/20/2009  . BIPOLAR DISORDER UNSPECIFIED 12/13/2007  . CHEST PAIN 03/23/2009  . DEPRESSION 12/13/2007  . FATIGUE 03/23/2009  . GLAUCOMA 09/03/2009  . HEMORRHOIDS 09/03/2009  . HOARSENESS 09/03/2009  . HYPERCHOLESTEROLEMIA 03/11/2010  . MYALGIA 08/13/2009  . NECK PAIN 01/18/2009  . Palpitations 03/23/2009  . Polydipsia 01/24/2008  . Shortness of breath 03/23/2009  . THYROID NODULE 08/28/2008    benign on Bx  . THYROIDITIS 12/13/2007  . GERD (gastroesophageal reflux disease)   . Arthritis     Past Surgical History  Procedure Laterality Date  . Back surgery      x 3, lumbar  . Finger surgery      History   Social History  . Marital Status: Single    Spouse Name: N/A    Number of Children: N/A  . Years of Education: N/A   Occupational History  . Not on file.   Social History Main Topics  . Smoking status: Never Smoker   . Smokeless tobacco: Never Used  . Alcohol Use: No  . Drug Use: No  . Sexual Activity: Not on file   Other Topics Concern  . 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       He is originally form [place]    Current Outpatient Prescriptions on File Prior to Visit  Medication Sig Dispense Refill  . buPROPion (WELLBUTRIN SR) 150 MG 12 hr tablet Take 450 mg by mouth every morning.        . cyclobenzaprine (FLEXERIL) 10 MG tablet Take 1 tablet (10 mg total) by mouth 3 (three) times daily as needed for muscle spasms.  30 tablet  0  . HYDROcodone-acetaminophen (VICODIN) 5-500 MG per tablet Take 1 tablet by mouth every 4 (four) hours as needed for pain.  20 tablet  0  . mirtazapine  (REMERON) 30 MG tablet Take 30 mg by mouth at bedtime.       . Multiple Vitamin (MULTIVITAMIN) tablet Take 1 tablet by mouth daily.         No current facility-administered medications on file prior to visit.   No Known Allergies  Family History  Problem Relation Age of Onset  . Hypertension Other   . Cancer Father     colon   BP 130/70  Pulse 82  Ht 5\' 5"  (1.651 m)  Wt 152 lb (68.947 kg)  BMI 25.29 kg/m2  SpO2 98%  Review of Systems Denies dysphagia    Objective:   Physical Exam VITAL SIGNS:  See vs page GENERAL: no distress NECK: There is no palpable thyroid enlargement.  No thyroid nodule is palpable.  No palpable lymphadenopathy at the anterior neck.    Lab Results  Component Value Date   TSH 2.164 10/04/2012      Assessment & Plan:  Thyroid nodule, nonpalpable.

## 2013-06-14 NOTE — Patient Instructions (Addendum)
Let's recheck the ultrasound.  you will receive a phone call, about a day and time for an appointment. blood tests are being requested for you today.  We'll contact you with results.  

## 2013-06-20 ENCOUNTER — Ambulatory Visit
Admission: RE | Admit: 2013-06-20 | Discharge: 2013-06-20 | Disposition: A | Discharge status: Home / Self Care | Payer: Managed Care, Other (non HMO) | Source: Ambulatory Visit | Attending: Endocrinology | Admitting: Endocrinology

## 2013-09-24 ENCOUNTER — Other Ambulatory Visit: Payer: Self-pay | Admitting: Physician Assistant

## 2013-10-29 ENCOUNTER — Ambulatory Visit (INDEPENDENT_AMBULATORY_CARE_PROVIDER_SITE_OTHER): Payer: Managed Care, Other (non HMO) | Admitting: Emergency Medicine

## 2013-10-29 ENCOUNTER — Ambulatory Visit: Payer: Managed Care, Other (non HMO)

## 2013-10-29 ENCOUNTER — Other Ambulatory Visit: Payer: Self-pay | Admitting: Emergency Medicine

## 2013-10-29 VITALS — BP 122/72 | HR 78 | Temp 98.2°F | Resp 18 | Ht 65.0 in | Wt 155.4 lb

## 2013-10-29 DIAGNOSIS — R079 Chest pain, unspecified: Secondary | ICD-10-CM

## 2013-10-29 DIAGNOSIS — R002 Palpitations: Secondary | ICD-10-CM

## 2013-10-29 DIAGNOSIS — E785 Hyperlipidemia, unspecified: Secondary | ICD-10-CM

## 2013-10-29 LAB — LIPID PANEL
CHOLESTEROL: 136 mg/dL (ref 0–200)
HDL: 70 mg/dL (ref >39)
LDL CALC: 45 mg/dL (ref 0–99)
TRIGLYCERIDES: 107 mg/dL (ref <150)
Total CHOL/HDL Ratio: 1.9 Ratio
VLDL: 21 mg/dL (ref 0–40)

## 2013-10-29 LAB — POCT CBC
GRANULOCYTE PERCENT: 32.2 % — AB (ref 37–80)
HCT, POC: 45.5 % (ref 43.5–53.7)
Hemoglobin: 14.2 g/dL (ref 14.1–18.1)
Lymph, poc: 3.2 (ref 0.6–3.4)
MCH, POC: 28.4 pg (ref 27–31.2)
MCHC: 31.2 g/dL — AB (ref 31.8–35.4)
MCV: 91 fL (ref 80–97)
MID (CBC): 0.4 (ref 0–0.9)
MPV: 7.1 fL (ref 0–99.8)
PLATELET COUNT, POC: 297 10*3/uL (ref 142–424)
POC GRANULOCYTE: 1.7 — AB (ref 2–6.9)
POC LYMPH %: 29.9 % (ref 10–50)
POC MID %: 7.9 % (ref 0–12)
RBC: 5 M/uL (ref 4.69–6.13)
RDW, POC: 13.7 %
WBC: 5.3 10*3/uL (ref 4.6–10.2)

## 2013-10-29 LAB — COMPREHENSIVE METABOLIC PANEL
ALT: 58 U/L — AB (ref 0–53)
AST: 39 U/L — ABNORMAL HIGH (ref 0–37)
Albumin: 4.3 g/dL (ref 3.5–5.2)
Alkaline Phosphatase: 98 U/L (ref 39–117)
BUN: 20 mg/dL (ref 6–23)
CALCIUM: 9.2 mg/dL (ref 8.4–10.5)
CHLORIDE: 102 meq/L (ref 96–112)
CO2: 27 meq/L (ref 19–32)
CREATININE: 0.99 mg/dL (ref 0.50–1.35)
Glucose, Bld: 84 mg/dL (ref 70–99)
Potassium: 3.9 mEq/L (ref 3.5–5.3)
Sodium: 137 mEq/L (ref 135–145)
Total Bilirubin: 0.5 mg/dL (ref 0.3–1.2)
Total Protein: 6.7 g/dL (ref 6.0–8.3)

## 2013-10-29 MED ORDER — MELOXICAM 7.5 MG PO TABS: 7.5000 mg | ORAL_TABLET | Freq: Every day | ORAL | Status: DC

## 2013-10-29 NOTE — Patient Instructions (Signed)

## 2013-10-29 NOTE — Progress Notes (Addendum)
   Subjective:    Patient ID: [name], male    DOB: [birth year], 53 y.o.   MRN: [id number] This chart was scribed for [name], MD by Vernell Barrier, Medical Scribe. This patient's care was started at 11:05 AM.  HPI HPI Comments: [name] is a 53 y.o. male who presents to the [facility] complaining of sharp chest pain, palpitations. Pain is rated at 6/10.  Pt does not feel pain with movement. Pt denies anything that got him upset that may have resulted in present symptoms. Pt had echocardiogram performed 2 years ago.  Pt denies SOB, pain with breathing. Pt denies smoking. Pt states he is compliant with all his medications.   Review of Systems  Respiratory: Negative for shortness of breath.   Musculoskeletal:       Pain at the chest      Objective:   Physical Exam  CONSTITUTIONAL: Well developed/well nourished HEAD: Normocephalic/atraumatic EYES: EOMI/PERRL ENMT: Mucous membranes moist NECK: supple no meningeal signs SPINE:entire spine nontender CV: S1/S2 noted, no murmurs/rubs/gallops noted LUNGS: Lungs are clear to auscultation bilaterally, no apparent distress ABDOMEN: soft, nontender, no rebound or guarding GU:no cva tenderness NEURO: Pt is awake/alert, moves all extremitiesx4 EXTREMITIES: pulses normal, full ROM SKIN: warm, color normal PSYCH: no abnormalities of mood noted MUSC: Very tender at costosternal junction of 2nd, 3rd, & 4th rib [facility] reading (PRIMARY) by  Dr Everlene Farrier  no acute disease  Results for orders placed in visit on 10/29/13  POCT CBC      Result Value Range   WBC 5.3  4.6 - 10.2 K/uL   Lymph, poc 3.2  0.6 - 3.4   POC LYMPH PERCENT 29.9  10 - 50 %L   MID (cbc) 0.4  0 - 0.9   POC MID % 7.9  0 - 12 %M   POC Granulocyte 1.7 (*) 2 - 6.9   Granulocyte percent 32.2 (*) 37 - 80 %G   RBC 5.00  4.69 - 6.13 M/uL   Hemoglobin 14.2  14.1 - 18.1 g/dL   HCT, POC 45.5  43.5 - 53.7 %   MCV 91.0  80 - 97 fL   MCH, POC 28.4  27 - 31.2  pg   MCHC 31.2 (*) 31.8 - 35.4 g/dL   RDW, POC 13.7     Platelet Count, POC 297  142 - 424 K/uL   MPV 7.1  0 - 99.8 fL       Assessment & Plan:  I feel this is chest wall pain. We'll place the patient on Mobic 7.5 to take one a day appointment made with cardiology for a checkup to alleviate the patient's anxiety over his chest pain.

## 2013-11-01 LAB — HEPATITIS C ANTIBODY: HCV AB: NEGATIVE

## 2013-11-09 ENCOUNTER — Institutional Professional Consult (permissible substitution): Payer: Managed Care, Other (non HMO) | Admitting: Cardiology

## 2013-11-14 ENCOUNTER — Telehealth: Payer: Self-pay

## 2013-11-14 NOTE — Telephone Encounter (Signed)
2 liver tests are very slightly elevated. No change in medications.  LMOM to CB

## 2013-11-14 NOTE — Telephone Encounter (Signed)
Please return call regarding lab results. Thank you!

## 2013-11-17 ENCOUNTER — Telehealth: Payer: Self-pay

## 2013-11-17 NOTE — Telephone Encounter (Signed)
Patient calling back to lab results and says to just leave a detailed message on labs results. I said it was important that he speak to someone on his lab results when he is available. He says he is currently at work and will try to call back after work around 3 or 4.   [phone]

## 2014-01-09 ENCOUNTER — Encounter: Payer: Self-pay | Admitting: Endocrinology

## 2014-01-09 ENCOUNTER — Ambulatory Visit (INDEPENDENT_AMBULATORY_CARE_PROVIDER_SITE_OTHER): Payer: Managed Care, Other (non HMO) | Admitting: Endocrinology

## 2014-01-09 VITALS — BP 120/80 | HR 80 | Temp 98.1°F | Ht 65.0 in | Wt 151.0 lb

## 2014-01-09 DIAGNOSIS — Z125 Encounter for screening for malignant neoplasm of prostate: Secondary | ICD-10-CM

## 2014-01-09 DIAGNOSIS — Z79899 Other long term (current) drug therapy: Secondary | ICD-10-CM

## 2014-01-09 DIAGNOSIS — K769 Liver disease, unspecified: Secondary | ICD-10-CM

## 2014-01-09 DIAGNOSIS — E7800 Pure hypercholesterolemia, unspecified: Secondary | ICD-10-CM

## 2014-01-09 DIAGNOSIS — IMO0001 Reserved for inherently not codable concepts without codable children: Secondary | ICD-10-CM

## 2014-01-09 DIAGNOSIS — E041 Nontoxic single thyroid nodule: Secondary | ICD-10-CM

## 2014-01-09 DIAGNOSIS — Z0000 Encounter for general adult medical examination without abnormal findings: Secondary | ICD-10-CM | POA: Insufficient documentation

## 2014-01-09 LAB — CBC WITH DIFFERENTIAL/PLATELET
BASOS ABS: 0 10*3/uL (ref 0.0–0.1)
BASOS PCT: 0 % (ref 0–1)
Eosinophils Absolute: 0.1 10*3/uL (ref 0.0–0.7)
Eosinophils Relative: 1 % (ref 0–5)
HEMATOCRIT: 42 % (ref 39.0–52.0)
Hemoglobin: 14.8 g/dL (ref 13.0–17.0)
Lymphocytes Relative: 60 % — ABNORMAL HIGH (ref 12–46)
Lymphs Abs: 3.1 10*3/uL (ref 0.7–4.0)
MCH: 28.1 pg (ref 26.0–34.0)
MCHC: 35.2 g/dL (ref 30.0–36.0)
MCV: 79.8 fL (ref 78.0–100.0)
MONO ABS: 0.4 10*3/uL (ref 0.1–1.0)
MONOS PCT: 7 % (ref 3–12)
Neutro Abs: 1.7 10*3/uL (ref 1.7–7.7)
Neutrophils Relative %: 32 % — ABNORMAL LOW (ref 43–77)
Platelets: 255 10*3/uL (ref 150–400)
RBC: 5.26 MIL/uL (ref 4.22–5.81)
RDW: 14 % (ref 11.5–15.5)
WBC: 5.2 10*3/uL (ref 4.0–10.5)

## 2014-01-09 NOTE — Patient Instructions (Addendum)
blood tests are being requested for you today.  We'll contact you with results. please consider these measures for your health:  minimize alcohol.  do not use tobacco products.  have a colonoscopy at least every 10 years from age 53.  keep firearms safely stored.  always use seat belts.  have working smoke alarms in your home.  see an eye doctor and dentist regularly.  never drive under the influence of alcohol or drugs (including prescription drugs).   Refer for a colonoscopy.  you will receive a phone call, about a day and time for an appointment.   Please return in 1 year.

## 2014-01-09 NOTE — Progress Notes (Signed)
Subjective:    Patient ID: [name], male    DOB: [birth year], 53 y.o.   MRN: [id number]  HPI Pt is here for regular wellness examination, and is feeling pretty well in general, and says chronic med probs are stable, except as noted below Past Medical History  Diagnosis Date  . ARM PAIN, LEFT 11/20/2009  . BIPOLAR DISORDER UNSPECIFIED 12/13/2007  . CHEST PAIN 03/23/2009  . DEPRESSION 12/13/2007  . FATIGUE 03/23/2009  . GLAUCOMA 09/03/2009  . HEMORRHOIDS 09/03/2009  . HOARSENESS 09/03/2009  . HYPERCHOLESTEROLEMIA 03/11/2010  . MYALGIA 08/13/2009  . NECK PAIN 01/18/2009  . Palpitations 03/23/2009  . Polydipsia 01/24/2008  . Shortness of breath 03/23/2009  . THYROID NODULE 08/28/2008    benign on Bx  . THYROIDITIS 12/13/2007  . GERD (gastroesophageal reflux disease)   . Arthritis     Past Surgical History  Procedure Laterality Date  . Back surgery      x 3, lumbar  . Finger surgery      History   Social History  . Marital Status: Single    Spouse Name: N/A    Number of Children: N/A  . Years of Education: N/A   Occupational History  . Not on file.   Social History Main Topics  . Smoking status: Never Smoker   . Smokeless tobacco: Never Used  . Alcohol Use: No  . Drug Use: No  . Sexual Activity: Not on file   Other Topics Concern  . 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       He is originally form [place]    Current Outpatient Prescriptions on File Prior to Visit  Medication Sig Dispense Refill  . atorvastatin (LIPITOR) 40 MG tablet Take 1 tablet (40 mg total) by mouth daily.  30 tablet  11  . atorvastatin (LIPITOR) 40 MG tablet TAKE 1 TABLET (40 MG TOTAL) BY MOUTH DAILY.  30 tablet  0  . buPROPion (WELLBUTRIN SR) 150 MG 12 hr tablet Take 450 mg by mouth every morning.        . cyclobenzaprine (FLEXERIL) 10 MG tablet Take 1 tablet (10 mg total) by mouth 3  (three) times daily as needed for muscle spasms.  30 tablet  0  . HYDROcodone-acetaminophen (VICODIN) 5-500 MG per tablet Take 1 tablet by mouth every 4 (four) hours as needed for pain.  20 tablet  0  . meloxicam (MOBIC) 7.5 MG tablet Take 1 tablet (7.5 mg total) by mouth daily.  30 tablet  0  . mirtazapine (REMERON) 30 MG tablet Take 30 mg by mouth at bedtime.       . Multiple Vitamin (MULTIVITAMIN) tablet Take 1 tablet by mouth daily.         No current facility-administered medications on file prior to visit.    No Known Allergies  Family History  Problem Relation Age of Onset  . Hypertension Other   . Cancer Father     colon    BP 120/80  Pulse 80  Temp(Src) 98.1 F (36.7 C) (Oral)  Ht 5\' 5"  (1.651 m)  Wt 151 lb (68.493 kg)  BMI 25.13 kg/m2  SpO2 90%     Review of Systems  Constitutional: Negative for fever.  HENT: Negative for hearing loss.   Eyes: Negative for visual disturbance.  Respiratory: Negative for shortness of breath.   Cardiovascular: Negative  for chest pain.  Gastrointestinal: Negative for anal bleeding.  Endocrine: Negative for cold intolerance.  Genitourinary: Negative for hematuria.  Musculoskeletal: Negative for back pain.  Allergic/Immunologic: Negative for environmental allergies.  Neurological: Negative for syncope.  Hematological: Does not bruise/bleed easily.  Psychiatric/Behavioral:       Depression is well-controlled.   He has lost a few lbs    Objective:   Physical Exam VS: see vs page GEN: no distress HEAD: head: no deformity eyes: no periorbital swelling, no proptosis external nose and ears are normal mouth: no lesion seen NECK: supple, thyroid is not enlarged CHEST WALL: no deformity LUNGS: clear to auscultation BREASTS:  No gynecomastia CV: reg rate and rhythm, no murmur ABD: abdomen is soft, nontender.  no hepatosplenomegaly.  not distended.  no hernia RECTAL: normal external and internal exam.  heme neg. PROSTATE:   Normal size.  No nodule MUSCULOSKELETAL: muscle bulk and strength are grossly normal.  no obvious joint swelling.  gait is normal and steady EXTEMITIES: no deformity.  no ulcer on the feet.  feet are of normal color and temp.  no edema PULSES: dorsalis pedis intact bilat.  no carotid bruit NEURO:  cn 2-12 grossly intact.   readily moves all 4's.  sensation is intact to touch on the feet SKIN:  Normal texture and temperature.  No rash or suspicious lesion is visible.   NODES:  None palpable at the neck PSYCH: alert, well-oriented.  Does not appear anxious nor depressed.      Assessment & Plan:  Wellness visit today, with problems stable, except as noted.    SEPARATE EVALUATION FOLLOWS--EACH PROBLEM HERE IS NEW, NOT RESPONDING TO TREATMENT, OR POSES SIGNIFICANT RISK TO THE PATIENT'S HEALTH: HISTORY OF THE PRESENT ILLNESS: Pt states few years of moderate myalgias, worse at the anterior thighs.  There is slight assoc numbness.  PAST MEDICAL HISTORY reviewed and up to date today REVIEW OF SYSTEMS: Denies rash PHYSICAL EXAMINATION: VITAL SIGNS:  See vs page GENERAL: no distress LE's: nontender. Gait: normal and steady LAB/XRAY RESULTS: CPK=elevated IMPRESSION: Myalgias, persistent, uncertain etiology. PLAN: See instruction page.

## 2014-01-10 LAB — HEPATIC FUNCTION PANEL
ALK PHOS: 91 U/L (ref 39–117)
ALT: 44 U/L (ref 0–53)
AST: 28 U/L (ref 0–37)
Albumin: 4.1 g/dL (ref 3.5–5.2)
BILIRUBIN DIRECT: 0.1 mg/dL (ref 0.0–0.3)
Indirect Bilirubin: 0.4 mg/dL (ref 0.2–1.2)
Total Bilirubin: 0.5 mg/dL (ref 0.2–1.2)
Total Protein: 6.9 g/dL (ref 6.0–8.3)

## 2014-01-10 LAB — URINALYSIS, ROUTINE W REFLEX MICROSCOPIC
BILIRUBIN URINE: NEGATIVE
GLUCOSE, UA: NEGATIVE mg/dL
Hgb urine dipstick: NEGATIVE
Ketones, ur: NEGATIVE mg/dL
Leukocytes, UA: NEGATIVE
Nitrite: NEGATIVE
PROTEIN: NEGATIVE mg/dL
SPECIFIC GRAVITY, URINE: 1.021 (ref 1.005–1.030)
Urobilinogen, UA: 0.2 mg/dL (ref 0.0–1.0)
pH: 7.5 (ref 5.0–8.0)

## 2014-01-10 LAB — BASIC METABOLIC PANEL
BUN: 15 mg/dL (ref 6–23)
CALCIUM: 9.4 mg/dL (ref 8.4–10.5)
CHLORIDE: 104 meq/L (ref 96–112)
CO2: 29 mEq/L (ref 19–32)
CREATININE: 0.98 mg/dL (ref 0.50–1.35)
Glucose, Bld: 103 mg/dL — ABNORMAL HIGH (ref 70–99)
Potassium: 4 mEq/L (ref 3.5–5.3)
Sodium: 139 mEq/L (ref 135–145)

## 2014-01-10 LAB — LIPID PANEL
CHOL/HDL RATIO: 2.1 ratio
CHOLESTEROL: 158 mg/dL (ref 0–200)
HDL: 76 mg/dL (ref >39)
LDL CALC: 39 mg/dL (ref 0–99)
TRIGLYCERIDES: 213 mg/dL — AB (ref <150)
VLDL: 43 mg/dL — AB (ref 0–40)

## 2014-01-10 LAB — PSA: PSA: 0.76 ng/mL (ref <=4.00)

## 2014-01-10 LAB — CK: Total CK: 338 U/L — ABNORMAL HIGH (ref 7–232)

## 2014-01-10 LAB — SEDIMENTATION RATE: Sed Rate: 4 mm/hr (ref 0–16)

## 2014-01-10 LAB — TSH: TSH: 2.528 u[IU]/mL (ref 0.350–4.500)

## 2014-01-11 ENCOUNTER — Ambulatory Visit (INDEPENDENT_AMBULATORY_CARE_PROVIDER_SITE_OTHER): Payer: Managed Care, Other (non HMO) | Admitting: Family Medicine

## 2014-01-11 ENCOUNTER — Ambulatory Visit: Payer: Managed Care, Other (non HMO)

## 2014-01-11 VITALS — BP 122/86 | HR 74 | Temp 97.6°F | Resp 18 | Ht 65.5 in | Wt 155.2 lb

## 2014-01-11 DIAGNOSIS — M25511 Pain in right shoulder: Secondary | ICD-10-CM

## 2014-01-11 DIAGNOSIS — M542 Cervicalgia: Secondary | ICD-10-CM

## 2014-01-11 DIAGNOSIS — M25519 Pain in unspecified shoulder: Secondary | ICD-10-CM

## 2014-01-11 DIAGNOSIS — M62838 Other muscle spasm: Secondary | ICD-10-CM

## 2014-01-11 DIAGNOSIS — M5412 Radiculopathy, cervical region: Secondary | ICD-10-CM

## 2014-01-11 MED ORDER — PREDNISONE 20 MG PO TABS: ORAL_TABLET | ORAL | Status: DC

## 2014-01-11 MED ORDER — HYDROCODONE-ACETAMINOPHEN 5-325 MG PO TABS
1.0000 | ORAL_TABLET | Freq: Four times a day (QID) | ORAL | Status: DC | PRN
Start: 2014-01-11 — End: 2014-01-20

## 2014-01-11 MED ORDER — METHOCARBAMOL 500 MG PO TABS: 500.0000 mg | ORAL_TABLET | Freq: Four times a day (QID) | ORAL | Status: DC
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
## 2014-01-11 NOTE — Patient Instructions (Signed)
Take the muscle relaxant one at breakfast one at lunch one at supper and 2 at bedtime. It makes you sleepy when you're trying to work don't take it, but I think you can tolerate that dose.  Take the prednisone 3 pills initially today, then 3 tomorrow morning, then 2 daily for 2 days, then one daily for 2 days. Best to take after breakfast.  Take the hydrocodone pain pills only when needed for bad pain  After you finish the prednisone, you can begin taking Aleve (naproxen) 220 mg 2 pills twice daily with food  Return if worse or not improving  Stay off of work through Thursday. If you still feel like it is hurting her a lot by Friday call and I will give you an extension on your excuse for another day.

## 2014-01-16 ENCOUNTER — Institutional Professional Consult (permissible substitution): Payer: Managed Care, Other (non HMO) | Admitting: Cardiology

## 2014-01-19 ENCOUNTER — Encounter (HOSPITAL_COMMUNITY): Payer: Self-pay | Admitting: Emergency Medicine

## 2014-01-19 ENCOUNTER — Telehealth: Payer: Self-pay

## 2014-01-19 ENCOUNTER — Emergency Department (HOSPITAL_COMMUNITY)
Admission: EM | Admit: 2014-01-19 | Discharge: 2014-01-20 | Disposition: A | Discharge status: Home / Self Care | Payer: Managed Care, Other (non HMO) | Attending: Emergency Medicine | Admitting: Emergency Medicine

## 2014-01-19 DIAGNOSIS — E7800 Pure hypercholesterolemia, unspecified: Secondary | ICD-10-CM | POA: Insufficient documentation

## 2014-01-19 DIAGNOSIS — F319 Bipolar disorder, unspecified: Secondary | ICD-10-CM | POA: Insufficient documentation

## 2014-01-19 DIAGNOSIS — IMO0001 Reserved for inherently not codable concepts without codable children: Secondary | ICD-10-CM

## 2014-01-19 DIAGNOSIS — M129 Arthropathy, unspecified: Secondary | ICD-10-CM | POA: Insufficient documentation

## 2014-01-19 DIAGNOSIS — H409 Unspecified glaucoma: Secondary | ICD-10-CM | POA: Insufficient documentation

## 2014-01-19 DIAGNOSIS — R079 Chest pain, unspecified: Secondary | ICD-10-CM | POA: Insufficient documentation

## 2014-01-19 DIAGNOSIS — B349 Viral infection, unspecified: Secondary | ICD-10-CM

## 2014-01-19 DIAGNOSIS — Z79899 Other long term (current) drug therapy: Secondary | ICD-10-CM | POA: Insufficient documentation

## 2014-01-19 DIAGNOSIS — B9789 Other viral agents as the cause of diseases classified elsewhere: Secondary | ICD-10-CM | POA: Insufficient documentation

## 2014-01-19 DIAGNOSIS — Z8719 Personal history of other diseases of the digestive system: Secondary | ICD-10-CM | POA: Insufficient documentation

## 2014-01-19 LAB — BASIC METABOLIC PANEL
BUN: 16 mg/dL (ref 6–23)
CALCIUM: 9.6 mg/dL (ref 8.4–10.5)
CO2: 24 mEq/L (ref 19–32)
CREATININE: 1.02 mg/dL (ref 0.50–1.35)
Chloride: 98 mEq/L (ref 96–112)
GFR calc Af Amer: >90 mL/min (ref >90)
GFR calc non Af Amer: 84 mL/min — ABNORMAL LOW (ref >90)
GLUCOSE: 84 mg/dL (ref 70–99)
Potassium: 3.9 mEq/L (ref 3.7–5.3)
SODIUM: 136 meq/L — AB (ref 137–147)

## 2014-01-19 LAB — CBC
HCT: 43.5 % (ref 39.0–52.0)
HEMOGLOBIN: 15 g/dL (ref 13.0–17.0)
MCH: 28.6 pg (ref 26.0–34.0)
MCHC: 34.5 g/dL (ref 30.0–36.0)
MCV: 82.9 fL (ref 78.0–100.0)
Platelets: 236 10*3/uL (ref 150–400)
RBC: 5.25 MIL/uL (ref 4.22–5.81)
RDW: 12.9 % (ref 11.5–15.5)
WBC: 12.3 10*3/uL — ABNORMAL HIGH (ref 4.0–10.5)

## 2014-01-19 LAB — TROPONIN I: Troponin I: <0.3 ng/mL (ref <0.30)

## 2014-01-19 NOTE — Telephone Encounter (Signed)
Pt advised.

## 2014-01-19 NOTE — Telephone Encounter (Signed)
Pt called stating that he would be ok with a muscle test being ordered at a neurologist office.  Please advise, Thanks!

## 2014-01-19 NOTE — ED Notes (Signed)
Pt reports fever, chest pain, leg pain and headache that has been going on for 2 days. Pt took hydrocodone for pain at 8 pm. Pt states he has chest pain to central chest pain that is sharp that does not radiate. Pt alert and orient. Pt ambulatory.

## 2014-01-19 NOTE — Telephone Encounter (Signed)
Ok, i ordered.  you will receive a phone call, about a day and time for an appointment

## 2014-01-20 ENCOUNTER — Emergency Department (HOSPITAL_COMMUNITY): Payer: Managed Care, Other (non HMO)

## 2014-01-20 MED ORDER — METOCLOPRAMIDE HCL 5 MG/ML IJ SOLN
10.0000 mg | Freq: Once | INTRAMUSCULAR | Status: AC
  Administered 2014-01-20: 10 mg via INTRAMUSCULAR
  Filled 2014-01-20: qty 2

## 2014-01-20 MED ORDER — IBUPROFEN 800 MG PO TABS
800.0000 mg | ORAL_TABLET | Freq: Once | ORAL | Status: AC
  Administered 2014-01-20: 800 mg via ORAL
  Filled 2014-01-20: qty 1

## 2014-01-20 NOTE — Discharge Instructions (Signed)
Take ibuprofen with food, up to 800mg  three times a day, as needed for pain and fever.  You can alternate with tylenol every three hours if necessary.  Get rest and drink plenty of fluids.  Follow up with your doctor if your symptoms have not started to improve by Monday.  Return to the ER if your headache or chest pain worsen, you develop changes in vision, shortness of breath, arm/leg weakness/numbness, loss of balance or any other symptoms that are concerning to you.

## 2014-01-20 NOTE — ED Notes (Signed)
Pt c/o fever x 2 days with generalized pain over body and chest pain since this morning. Pt denies N/V, SOB but c/o dizziness. A&Ox4.

## 2014-01-20 NOTE — ED Provider Notes (Signed)
Medical screening examination/treatment/procedure(s) were performed by non-physician practitioner and as supervising physician I was immediately available for consultation/collaboration.   EKG Interpretation None       Varney Biles, MD 01/20/14 2219

## 2014-01-20 NOTE — ED Provider Notes (Signed)
CSN: [id number]     Arrival date & time 01/19/14  2229 History   First MD Initiated Contact with Patient 01/20/14 0054     Chief Complaint  Patient presents with  . Chest Pain  . Generalized Body Aches     (Consider location/radiation/quality/duration/timing/severity/associated sxs/prior Treatment) HPI History provided by pt.   Pt presents w/ multiple complaints.  For that past two weeks, he has had intermittent, sharp, non-radiating, non-positional, non-exertional, non-pleuritic pain left sternal border.  No associated fever, cough, SOB, LE edema or unilateral calf pain.  No recent trauma.  No RF for PE.  RF for ACS include hypercholesterolemia.  Also intermittent lightheadedness x 1 week, diffuse body aches x 2 days, fever and headache, onset 6pm.  Headache diffuse.  Denies vision changes, photo/phonophobia, N/V, abd pain, extremities weakness/paresthesias.  No relief of pain w/ one vicodin, that was prescribed for musculoskeletal neck/RUE pain by PCP one week ago.  No known sick contacts.  No head trauma. Past Medical History  Diagnosis Date  . ARM PAIN, LEFT 11/20/2009  . BIPOLAR DISORDER UNSPECIFIED 12/13/2007  . CHEST PAIN 03/23/2009  . DEPRESSION 12/13/2007  . FATIGUE 03/23/2009  . GLAUCOMA 09/03/2009  . HEMORRHOIDS 09/03/2009  . HOARSENESS 09/03/2009  . HYPERCHOLESTEROLEMIA 03/11/2010  . MYALGIA 08/13/2009  . NECK PAIN 01/18/2009  . Palpitations 03/23/2009  . Polydipsia 01/24/2008  . Shortness of breath 03/23/2009  . THYROID NODULE 08/28/2008    benign on Bx  . THYROIDITIS 12/13/2007  . GERD (gastroesophageal reflux disease)   . Arthritis    Past Surgical History  Procedure Laterality Date  . Back surgery      x 3, lumbar  . Finger surgery     Family History  Problem Relation Age of Onset  . Hypertension Other   . Cancer Father     colon   History  Substance Use Topics  . Smoking status: Never Smoker   . Smokeless tobacco: Never Used  . Alcohol Use: No    Review of  Systems  All other systems reviewed and are negative.      Allergies  Review of patient's allergies indicates no known allergies.  Home Medications   Current Outpatient Rx  Name  Route  Sig  Dispense  Refill  . acetaminophen (TYLENOL) 500 MG tablet   Oral   Take 1,000 mg by mouth every 6 (six) hours as needed for moderate pain.         Marland Kitchen atorvastatin (LIPITOR) 40 MG tablet   Oral   Take 1 tablet (40 mg total) by mouth daily.   30 tablet   11   . buPROPion (WELLBUTRIN XL) 150 MG 24 hr tablet   Oral   Take 450 mg by mouth daily.         . calcium elemental as carbonate (TUMS ULTRA 1000) 400 MG tablet   Oral   Chew 1,000 mg by mouth 3 (three) times daily as needed for heartburn.         Marland Kitchen HYDROcodone-acetaminophen (NORCO/VICODIN) 5-325 MG per tablet   Oral   Take 1 tablet by mouth every 6 (six) hours as needed for moderate pain.         . hydroxypropyl methylcellulose (ISOPTO TEARS) 2.5 % ophthalmic solution   Both Eyes   Place 1 drop into both eyes 3 (three) times daily as needed for dry eyes.         . mirtazapine (REMERON) 30 MG tablet   Oral  Take 30 mg by mouth at bedtime.          . Multiple Vitamin (MULTIVITAMIN) tablet   Oral   Take 1 tablet by mouth daily.           . Travoprost, BAK Free, (TRAVATAN) 0.004 % SOLN ophthalmic solution   Both Eyes   Place 1 drop into both eyes at bedtime.          BP 124/83  Pulse 102  Temp(Src) 99.8 F (37.7 C) (Oral)  Resp 18  SpO2 96% Physical Exam  Nursing note and vitals reviewed. Constitutional: He is oriented to person, place, and time. He appears well-developed and well-nourished. No distress.  HENT:  Head: Normocephalic and atraumatic.  Mouth/Throat: Oropharynx is clear and moist.  Eyes:  Normal appearance  Neck: Normal range of motion.  No meningismus   Cardiovascular: Normal rate, regular rhythm and intact distal pulses.   Pulmonary/Chest: Effort normal and breath sounds normal.   Abdominal: Soft. Bowel sounds are normal. He exhibits no distension and no mass. There is no tenderness. There is no rebound and no guarding.  Genitourinary:  No CVA ttp  Musculoskeletal: Normal range of motion.  Neurological: He is alert and oriented to person, place, and time. No sensory deficit. Coordination normal.  CN 3-12 intact.  No nystagmus.  5/5 and equal upper and lower extremity strength.  No past pointing.    Skin: Skin is warm and dry. No rash noted.  Psychiatric: He has a normal mood and affect. His behavior is normal.    ED Course  Procedures (including critical care time) Labs Review Labs Reviewed  CBC - Abnormal; Notable for the following:    WBC 12.3 (*)    All other components within normal limits  BASIC METABOLIC PANEL - Abnormal; Notable for the following:    Sodium 136 (*)    GFR calc non Af Amer 84 (*)    All other components within normal limits  TROPONIN I   Imaging Review No results found.   EKG Interpretation None      MDM   Final diagnoses:  Viral syndrome    53yo M presents w/ multiple complaints.  Has had headache and fever since 6pm today.  Low suspicion for meningitis; non-toxic appearing, NAD, A&O, no focal neuro deficits, no meningismus.  Pain improved w/ IM reglan.  Also c/o non-traumatic L anterior CP x 2 weeks.  CXR neg.  No RF for PE or signs of DVT on exam.  Doubt ACS; low risk, pain atypical and reproducible on exam, EKG non-ischemic, troponin neg.  Tenderness at LSB.  Suspect costochondritis.  Recommended NSAID, rest, ice.  Also c/o 1wk intermittent lightheadedness and 2 days body aches.  No respiratory, GI or urinary sx.  Well-hydrated on exam.  Labs unremarkable.  Suspect that he has a viral illness and that this is the etiology of headache and fever as well.  Sx improved w/ ibuprofen.  Advised rest and fluids and f/u w/ PCP for persistent sx.  Return precautions discussed.  VS improved at time of discharge.    Remer Macho, PA-C 01/20/14 [phone]

## 2014-02-02 ENCOUNTER — Ambulatory Visit (INDEPENDENT_AMBULATORY_CARE_PROVIDER_SITE_OTHER): Payer: Managed Care, Other (non HMO) | Admitting: Family Medicine

## 2014-02-02 VITALS — BP 132/90 | HR 93 | Temp 98.4°F | Resp 18 | Ht 65.0 in | Wt 156.0 lb

## 2014-02-02 DIAGNOSIS — F4323 Adjustment disorder with mixed anxiety and depressed mood: Secondary | ICD-10-CM

## 2014-02-02 DIAGNOSIS — M542 Cervicalgia: Secondary | ICD-10-CM

## 2014-02-02 LAB — POCT CBC
Granulocyte percent: 67.4 %G (ref 37–80)
HCT, POC: 41.4 % — AB (ref 43.5–53.7)
Hemoglobin: 13.4 g/dL — AB (ref 14.1–18.1)
Lymph, poc: 2 (ref 0.6–3.4)
MCH, POC: 28.3 pg (ref 27–31.2)
MCHC: 32.4 g/dL (ref 31.8–35.4)
MCV: 87.6 fL (ref 80–97)
MID (cbc): 0.6 (ref 0–0.9)
MPV: 6.7 fL (ref 0–99.8)
POC Granulocyte: 5.5 (ref 2–6.9)
POC LYMPH PERCENT: 24.8 %L (ref 10–50)
POC MID %: 7.8 %M (ref 0–12)
Platelet Count, POC: 287 10*3/uL (ref 142–424)
RBC: 4.73 M/uL (ref 4.69–6.13)
RDW, POC: 13 %
WBC: 8.1 10*3/uL (ref 4.6–10.2)

## 2014-02-02 MED ORDER — AMOXICILLIN 875 MG PO TABS: 875.0000 mg | ORAL_TABLET | Freq: Two times a day (BID) | ORAL | Status: DC

## 2014-02-02 MED ORDER — CYCLOBENZAPRINE HCL 5 MG PO TABS: 5.0000 mg | ORAL_TABLET | Freq: Every day | ORAL | Status: DC

## 2014-02-02 MED ORDER — KETOROLAC TROMETHAMINE 60 MG/2ML IM SOLN
60.0000 mg | Freq: Once | INTRAMUSCULAR | Status: AC
  Administered 2014-02-02: 60 mg via INTRAMUSCULAR

## 2014-02-02 NOTE — Progress Notes (Signed)
This chart was scribed for Robyn Haber, MD by Vernell Barrier, Medical Scribe. This patient's care was started at 8:44 PM.  Patient ID: [name] MRN: [id number], DOB: Mar 24, 1961, 53 y.o. Date of Encounter: 02/02/2014, 8:44 PM  Primary Physician: Renato Shin, MD  Chief Complaint: headache and sore throat  HPI: 53 y.o. year old male with history below presents with gradually worsening sore throat and headache, onset 2 days ago. Reports trouble eating and drinking due to pain. Reports pain turning his head to the left that radiates to the ears and around the back of the head. Pain is interrupting with sleep. He states he has no energy and generalized body aches. Says he has been under a lot of stress lately and has been taking anti-depressant medication for the past 5 years. Reports pain in neck and shoulders 2 weeks ago that has since almost completely resolved. Pt works at [employer]. Daughter and fiance are in [place]. Rest of the family is still in [place]. Denies any injury or trauma related to pain. Denies SOB, nausea, vomiting, fever.   Past Medical History  Diagnosis Date   ARM PAIN, LEFT 11/20/2009   BIPOLAR DISORDER UNSPECIFIED 12/13/2007   CHEST PAIN 03/23/2009   DEPRESSION 12/13/2007   FATIGUE 03/23/2009   GLAUCOMA 09/03/2009   HEMORRHOIDS 09/03/2009   HOARSENESS 09/03/2009   HYPERCHOLESTEROLEMIA 03/11/2010   MYALGIA 08/13/2009   NECK PAIN 01/18/2009   Palpitations 03/23/2009   Polydipsia 01/24/2008   Shortness of breath 03/23/2009   THYROID NODULE 08/28/2008    benign on Bx   THYROIDITIS 12/13/2007   GERD (gastroesophageal reflux disease)    Arthritis      Home Meds: Prior to Admission medications   Medication Sig Start Date End Date Taking? Authorizing Provider  acetaminophen (TYLENOL) 500 MG tablet Take 1,000 mg by mouth every 6 (six) hours as needed for moderate pain.   Yes Historical Provider, MD  atorvastatin (LIPITOR) 40 MG tablet Take 1 tablet (40  mg total) by mouth daily. 06/14/13  Yes Renato Shin, MD  buPROPion (WELLBUTRIN XL) 150 MG 24 hr tablet Take 450 mg by mouth daily.   Yes Historical Provider, MD  calcium elemental as carbonate (TUMS ULTRA 1000) 400 MG tablet Chew 1,000 mg by mouth 3 (three) times daily as needed for heartburn.   Yes Historical Provider, MD  HYDROcodone-acetaminophen (NORCO/VICODIN) 5-325 MG per tablet Take 1 tablet by mouth every 6 (six) hours as needed for moderate pain.   Yes Historical Provider, MD  mirtazapine (REMERON) 30 MG tablet Take 30 mg by mouth at bedtime.  09/07/11  Yes Historical Provider, MD  Multiple Vitamin (MULTIVITAMIN) tablet Take 1 tablet by mouth daily.     Yes Historical Provider, MD  Polyethyl Glycol-Propyl Glycol (SYSTANE) 0.4-0.3 % SOLN Apply to eye.   Yes Historical Provider, MD  Travoprost, BAK Free, (TRAVATAN) 0.004 % SOLN ophthalmic solution Place 1 drop into both eyes at bedtime.   Yes Historical Provider, MD  hydroxypropyl methylcellulose (ISOPTO TEARS) 2.5 % ophthalmic solution Place 1 drop into both eyes 3 (three) times daily as needed for dry eyes.    Historical Provider, MD    Allergies: No Known Allergies  History   Social History   Marital Status: Single    Spouse Name: N/A    Number of Children: N/A   Years of Education: N/A   Occupational History   Not on file.   Social History Main Topics   Smoking status: Never Smoker  Smokeless tobacco: Never Used   Alcohol Use: No   Drug Use: No   Sexual Activity: Not on file   Other Topics Concern   Not on file   Social History Narrative   [name] lives with his wife in [place], 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       He is originally form [place]     Review of Systems: Constitutional: negative for chills, night sweats, weight changes, or fatigue. Denies fever. HEENT: negative for vision changes, hearing loss, congestion, rhinorrhea, ST, epistaxis, or sinus pressure.  Positive for sore throat. Cardiovascular: negative for chest pain or palpitations Respiratory: negative for hemoptysis, wheezing, or cough. Negative for shortness of breath. Abdominal: negative for abdominal pain, diarrhea, or constipation. Negative for nausea or vomiting. Dermatological: negative for rash Neurologic: negative for dizziness, or syncope. Positive for headache. All other systems reviewed and are otherwise negative with the exception to those above and in the HPI.   Physical Exam: Blood pressure 132/90, pulse 93, temperature 98.4 F (36.9 C), temperature source Oral, resp. rate 18, height 5\' 5"  (1.651 m), weight 156 lb (70.761 kg), SpO2 97.00%., Body mass index is 25.96 kg/(m^2). General: Well developed, well nourished, in no acute distress. Head: Normocephalic, atraumatic, eyes without discharge, sclera non-icteric, nares are without discharge. Bilateral auditory canals clear, TM's are without perforation, pearly grey and translucent with reflective cone of light bilaterally. Oral cavity moist, posterior pharynx without exudate,  peritonsillar abscess, or post nasal drip.  Neck: Supple. No thyromegaly. Full ROM. No lymphadenopathy. Lungs: Clear bilaterally to auscultation without wheezes, rales, or rhonchi. Breathing is unlabored. Heart: RRR with S1 S2. No murmurs, rubs, or gallops appreciated. Abdomen: Soft, non-tender, non-distended with normoactive bowel sounds. No hepatomegaly. No rebound/guarding. No obvious abdominal masses. Msk:  Strength and tone normal for age. Extremities/Skin: Warm and dry. No clubbing or cyanosis. No edema. No rashes or suspicious lesions. Neuro: Alert and oriented X 3. Moves all extremities spontaneously. Gait is normal. CNII-XII grossly in tact. Psych:  Responds to questions appropriately with a normal affect.   Labs: Results for orders placed in visit on 02/02/14  POCT CBC      Result Value Ref Range   WBC 8.1  4.6 - 10.2 K/uL   Lymph, poc  2.0  0.6 - 3.4   POC LYMPH PERCENT 24.8  10 - 50 %L   MID (cbc) 0.6  0 - 0.9   POC MID % 7.8  0 - 12 %M   POC Granulocyte 5.5  2 - 6.9   Granulocyte percent 67.4  37 - 80 %G   RBC 4.73  4.69 - 6.13 M/uL   Hemoglobin 13.4 (*) 14.1 - 18.1 g/dL   HCT, POC 41.4 (*) 43.5 - 53.7 %   MCV 87.6  80 - 97 fL   MCH, POC 28.3  27 - 31.2 pg   MCHC 32.4  31.8 - 35.4 g/dL   RDW, POC 13.0     Platelet Count, POC 287  142 - 424 K/uL   MPV 6.7  0 - 99.8 fL   Posterior pharynx is red.   ASSESSMENT AND PLAN:  53 y.o. year old male with neck spasms.  No evidence for meningitis. Neck pain - Plan: POCT CBC, Sedimentation rate, amoxicillin (AMOXIL) 875 MG tablet, cyclobenzaprine (FLEXERIL) 5 MG tablet, Culture, Group A Strep, CANCELED: POCT SEDIMENTATION RATE  Adjustment disorder with mixed anxiety and depressed mood - Plan: Sedimentation rate  Signed, Synetta Shadow  Joseph Art, MD    Signed, Robyn Haber, MD 02/02/2014 8:44 PM

## 2014-02-02 NOTE — Addendum Note (Signed)
Addended by: Kem Boroughs D on: 02/02/2014 09:29 PM   Modules accepted: Orders

## 2014-02-03 LAB — SEDIMENTATION RATE: Sed Rate: 20 mm/hr — ABNORMAL HIGH (ref 0–16)

## 2014-02-05 LAB — CULTURE, GROUP A STREP: Organism ID, Bacteria: NORMAL

## 2014-02-07 ENCOUNTER — Ambulatory Visit (INDEPENDENT_AMBULATORY_CARE_PROVIDER_SITE_OTHER): Payer: Managed Care, Other (non HMO) | Admitting: Endocrinology

## 2014-02-07 ENCOUNTER — Encounter: Payer: Self-pay | Admitting: Endocrinology

## 2014-02-07 VITALS — BP 120/82 | HR 98 | Temp 98.2°F | Ht 65.0 in | Wt 156.0 lb

## 2014-02-07 DIAGNOSIS — M542 Cervicalgia: Secondary | ICD-10-CM

## 2014-02-07 MED ORDER — HYDROCODONE-ACETAMINOPHEN 5-325 MG PO TABS: 1.0000 | ORAL_TABLET | Freq: Four times a day (QID) | ORAL | Status: DC | PRN

## 2014-02-07 MED ORDER — AZITHROMYCIN 500 MG PO TABS: 500.0000 mg | ORAL_TABLET | Freq: Every day | ORAL | Status: DC

## 2014-02-07 MED ORDER — BENZONATATE 100 MG PO CAPS: 100.0000 mg | ORAL_CAPSULE | Freq: Three times a day (TID) | ORAL | Status: DC | PRN

## 2014-02-07 NOTE — Patient Instructions (Addendum)
i have sent 2 prescriptions to your pharmacy: for a different antibiotic, and an cough pill. I hope you feel better soon.  If you don't feel better by next week, please call back.  Please call sooner if you get worse. Loratadine-d (non-prescription) will help your congestion. Here is a prescription, for a pain pill.

## 2014-02-07 NOTE — Progress Notes (Signed)
Subjective:    Patient ID: [name], male    DOB: [birth year], 53 y.o.   MRN: [id number]  HPI Pt states 1 week of moderate pain at the throat, and assoc bilat otalgia.   Past Medical History  Diagnosis Date  . ARM PAIN, LEFT 11/20/2009  . BIPOLAR DISORDER UNSPECIFIED 12/13/2007  . CHEST PAIN 03/23/2009  . DEPRESSION 12/13/2007  . FATIGUE 03/23/2009  . GLAUCOMA 09/03/2009  . HEMORRHOIDS 09/03/2009  . HOARSENESS 09/03/2009  . HYPERCHOLESTEROLEMIA 03/11/2010  . MYALGIA 08/13/2009  . NECK PAIN 01/18/2009  . Palpitations 03/23/2009  . Polydipsia 01/24/2008  . Shortness of breath 03/23/2009  . THYROID NODULE 08/28/2008    benign on Bx  . THYROIDITIS 12/13/2007  . GERD (gastroesophageal reflux disease)   . Arthritis     Past Surgical History  Procedure Laterality Date  . Back surgery      x 3, lumbar  . Finger surgery      History   Social History  . Marital Status: Single    Spouse Name: N/A    Number of Children: N/A  . Years of Education: N/A   Occupational History  . Not on file.   Social History Main Topics  . Smoking status: Never Smoker   . Smokeless tobacco: Never Used  . Alcohol Use: No  . Drug Use: No  . Sexual Activity: Not on file   Other Topics Concern  . Not on file   Social History Narrative   Mr Sexson lives with his wife in [place], 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       He is originally form [place]    Current Outpatient Prescriptions on File Prior to Visit  Medication Sig Dispense Refill  . amoxicillin (AMOXIL) 875 MG tablet Take 1 tablet (875 mg total) by mouth 2 (two) times daily.  20 tablet  0  . atorvastatin (LIPITOR) 40 MG tablet Take 1 tablet (40 mg total) by mouth daily.  30 tablet  11  . buPROPion (WELLBUTRIN XL) 150 MG 24 hr tablet Take 450 mg by mouth daily.      . calcium elemental as carbonate (TUMS ULTRA 1000) 400 MG tablet Chew 1,000 mg by mouth 3 (three) times daily as needed for heartburn.      .  cyclobenzaprine (FLEXERIL) 5 MG tablet Take 1 tablet (5 mg total) by mouth at bedtime.  10 tablet  1  . hydroxypropyl methylcellulose (ISOPTO TEARS) 2.5 % ophthalmic solution Place 1 drop into both eyes 3 (three) times daily as needed for dry eyes.      . mirtazapine (REMERON) 30 MG tablet Take 30 mg by mouth at bedtime.       . Multiple Vitamin (MULTIVITAMIN) tablet Take 1 tablet by mouth daily.        Vladimir Faster Glycol-Propyl Glycol (SYSTANE) 0.4-0.3 % SOLN Apply to eye.      . Travoprost, BAK Free, (TRAVATAN) 0.004 % SOLN ophthalmic solution Place 1 drop into both eyes at bedtime.       No current facility-administered medications on file prior to visit.    No Known Allergies  Family History  Problem Relation Age of Onset  . Hypertension Other   . Cancer Father     colon    BP 120/82  Pulse 98  Temp(Src) 98.2 F (36.8 C) (Oral)  Ht 5\' 5"  (1.651 m)  Wt 156 lb (70.761 kg)  BMI 25.96 kg/m2  SpO2 97%  Review  of Systems He has a slight cough, but no fever.      Objective:   Physical Exam VITAL SIGNS:  See vs page GENERAL: no distress head: no deformity eyes: no periorbital swelling, no proptosis external nose and ears are normal mouth: no lesion seen Left TM is red.  The right is normal LUNGS:  Clear to auscultation HEART:  Regular rate and rhythm without murmurs noted. Normal S1,S2.        Assessment & Plan:  URI, new Neck pain, apparently due to URI.

## 2014-02-10 ENCOUNTER — Encounter: Payer: Self-pay | Admitting: Endocrinology

## 2014-02-13 ENCOUNTER — Ambulatory Visit (INDEPENDENT_AMBULATORY_CARE_PROVIDER_SITE_OTHER): Payer: Managed Care, Other (non HMO) | Admitting: Cardiology

## 2014-02-13 ENCOUNTER — Encounter: Payer: Self-pay | Admitting: Cardiology

## 2014-02-13 ENCOUNTER — Ambulatory Visit: Payer: Managed Care, Other (non HMO) | Admitting: Endocrinology

## 2014-02-13 VITALS — BP 125/87 | HR 83 | Ht 65.0 in | Wt 154.4 lb

## 2014-02-13 DIAGNOSIS — R079 Chest pain, unspecified: Secondary | ICD-10-CM

## 2014-02-13 NOTE — Patient Instructions (Signed)
Your physician has requested that you have an echocardiogram. Echocardiography is a painless test that uses sound waves to create images of your heart. It provides your doctor with information about the size and shape of your heart and how well your heart's chambers and valves are working. This procedure takes approximately one hour. There are no restrictions for this procedure.  Your physician has requested that you have an exercise tolerance test. For further information please visit [email or web address]. Please also follow instruction sheet, as given.  Your physician recommends that you schedule a follow-up appointment as needed after testing.

## 2014-02-13 NOTE — Progress Notes (Signed)
[place], [address] [place], [place]  [address] Phone: [phone] Fax:  [phone]  Date:  02/13/2014   ID:  [name], DOB 12/24/1960, MRN [id number]  PCP:  Renato Shin, MD  Cardiologist:  Fransico Him, MD     History of Present Illness: [name] is a 53 y.o. male with  History of bipolar disorder, depression who recently presented to the ER with complaints of intermittent sharp, nonradiating and nonpositional chest pain at the left sternal border.  He denies any associated SOB, DOE, diaphoresis or nausea.  When he was seen in the ER for this he was also having diffuse body aches with fever and headache.  His CP was reproducible on exam by the ER and it was felt that he had a viral syndrome.  He says that before the ER visit he had seen Dr. Everlene Farrier about chest pain that was different from the CP he had in the ER.  The pain was midsternal and was sharp with radiation to the neck and right arm.  He denies any SOB, DOE, diaphoresis or nausea.  Since the ER visit the CP has completely resolved and he has not had any further CP.  He denies any history of SOB.  He denies any LE edema, dizziness, palpitations or syncope.   Wt Readings from Last 3 Encounters:  02/13/14 154 lb 6.4 oz (70.035 kg)  02/07/14 156 lb (70.761 kg)  02/02/14 156 lb (70.761 kg)     Past Medical History  Diagnosis Date  . ARM PAIN, LEFT 11/20/2009  . BIPOLAR DISORDER UNSPECIFIED 12/13/2007  . CHEST PAIN 03/23/2009  . DEPRESSION 12/13/2007  . FATIGUE 03/23/2009  . GLAUCOMA 09/03/2009  . HEMORRHOIDS 09/03/2009  . HOARSENESS 09/03/2009  . HYPERCHOLESTEROLEMIA 03/11/2010  . MYALGIA 08/13/2009  . NECK PAIN 01/18/2009  . Palpitations 03/23/2009  . Polydipsia 01/24/2008  . Shortness of breath 03/23/2009  . THYROID NODULE 08/28/2008    benign on Bx  . THYROIDITIS 12/13/2007  . GERD (gastroesophageal reflux disease)   . Arthritis     Current Outpatient Prescriptions  Medication Sig Dispense Refill  . amoxicillin  (AMOXIL) 875 MG tablet Take 1 tablet (875 mg total) by mouth 2 (two) times daily.  20 tablet  0  . atorvastatin (LIPITOR) 40 MG tablet Take 1 tablet (40 mg total) by mouth daily.  30 tablet  11  . benzonatate (TESSALON) 100 MG capsule Take 1 capsule (100 mg total) by mouth 3 (three) times daily as needed for cough.  30 capsule  0  . buPROPion (WELLBUTRIN XL) 150 MG 24 hr tablet Take 450 mg by mouth daily.      . calcium elemental as carbonate (TUMS ULTRA 1000) 400 MG tablet Chew 1,000 mg by mouth 3 (three) times daily as needed for heartburn.      . cyclobenzaprine (FLEXERIL) 5 MG tablet Take 1 tablet (5 mg total) by mouth at bedtime.  10 tablet  1  . HYDROcodone-acetaminophen (NORCO/VICODIN) 5-325 MG per tablet Take 1 tablet by mouth every 6 (six) hours as needed for moderate pain.  30 tablet  0  . hydroxypropyl methylcellulose (ISOPTO TEARS) 2.5 % ophthalmic solution Place 1 drop into both eyes 3 (three) times daily as needed for dry eyes.      . mirtazapine (REMERON) 30 MG tablet Take 30 mg by mouth at bedtime.       . Multiple Vitamin (MULTIVITAMIN) tablet Take 1 tablet by mouth daily.        Marland Kitchen  Polyethyl Glycol-Propyl Glycol (SYSTANE) 0.4-0.3 % SOLN Apply to eye.      . Travoprost, BAK Free, (TRAVATAN) 0.004 % SOLN ophthalmic solution Place 1 drop into both eyes at bedtime.       No current facility-administered medications for this visit.    Allergies:   No Known Allergies  Social History:  The patient  reports that he has never smoked. He has never used smokeless tobacco. He reports that he does not drink alcohol or use illicit drugs.   Family History:  The patient's family history includes Cancer in his father; Hypertension in his other.   ROS:  Please see the history of present illness.      All other systems reviewed and negative.   PHYSICAL EXAM: VS:  BP 125/87  Pulse 83  Ht 5\' 5"  (1.651 m)  Wt 154 lb 6.4 oz (70.035 kg)  BMI 25.69 kg/m2 Well nourished, well developed, in no  acute distress HEENT: normal Neck: no JVD Cardiac:  normal S1, S2; RRR; no murmur Lungs:  clear to auscultation bilaterally, no wheezing, rhonchi or rales Abd: soft, nontender, no hepatomegaly Ext: no edema Skin: warm and dry Neuro:  CNs 2-12 intact, no focal abnormalities noted  EKG:  NSR with early repolarization     ASSESSMENT AND PLAN:  1. Atypical chest pain that sounds noncardiac.  He was diagnosed with a viral syndrome shortly after CP started and this has now resolved.  EKG is nonischemic - ETT to rule out ischemia since CP started prior to him having any fever or chills - 2D echo to rule out pericardial effusion  Followup PRN pending results of studies  Signed, Fransico Him, MD 02/13/2014 2:22 PM

## 2014-03-06 ENCOUNTER — Encounter: Payer: Managed Care, Other (non HMO) | Admitting: Neurology

## 2014-03-13 ENCOUNTER — Ambulatory Visit (INDEPENDENT_AMBULATORY_CARE_PROVIDER_SITE_OTHER): Payer: Managed Care, Other (non HMO) | Admitting: Physician Assistant

## 2014-03-13 ENCOUNTER — Encounter: Payer: Managed Care, Other (non HMO) | Admitting: Physician Assistant

## 2014-03-13 ENCOUNTER — Ambulatory Visit (HOSPITAL_COMMUNITY): Payer: Managed Care, Other (non HMO) | Attending: Cardiovascular Disease | Admitting: Cardiology

## 2014-03-13 ENCOUNTER — Encounter: Payer: Self-pay | Admitting: Physician Assistant

## 2014-03-13 DIAGNOSIS — R079 Chest pain, unspecified: Secondary | ICD-10-CM

## 2014-03-13 NOTE — Progress Notes (Signed)
Exercise Treadmill Test  Pre-Exercise Testing Evaluation Rhythm: normal sinus  Rate: 80     Test  Exercise Tolerance Test Ordering MD: Fransico Him, MD  Interpreting MD: Richardson Dopp, PA-C  Unique Test No: 1  Treadmill:  1  Indication for ETT: chest pain - rule out ischemia  Contraindication to ETT: No   Stress Modality: exercise - treadmill  Cardiac Imaging Performed: non   Protocol: standard Bruce - maximal  Max BP:  194/84  Max MPHR (bpm):  170 85% MPR (bpm):  145  MPHR obtained (bpm):  151 % MPHR obtained:  89  Reached 85% MPHR (min:sec):  9:00 Total Exercise Time (min-sec):  9:21  Workload in METS:  10.6 Borg Scale: 17  Reason ETT Terminated:  patient's desire to stop    ST Segment Analysis At Rest: normal ST segments - no evidence of significant ST depression With Exercise: no evidence of significant ST depression  Other Information Arrhythmia:  No Angina during ETT:  absent (0) Quality of ETT:  diagnostic  ETT Interpretation:  normal - no evidence of ischemia by ST analysis  Comments: Good exercise capacity. No chest pain. He did complain of leg pain. Normal BP response to exercise. No ST changes to suggest ischemia.   Recommendations: F/u with Dr. Fransico Him as planned. Signed,  Richardson Dopp, PA-C   03/13/2014 11:18 AM

## 2014-03-13 NOTE — Progress Notes (Signed)
Echo performed. 

## 2014-04-10 ENCOUNTER — Ambulatory Visit: Payer: Managed Care, Other (non HMO) | Admitting: Cardiology

## 2014-04-10 ENCOUNTER — Encounter: Payer: Self-pay | Admitting: General Surgery

## 2014-04-17 ENCOUNTER — Telehealth: Payer: Self-pay | Admitting: Cardiology

## 2014-04-17 NOTE — Telephone Encounter (Signed)
Spoke with pt and gave results. Forwarded Echo back to DR Radford Pax

## 2014-04-17 NOTE — Telephone Encounter (Signed)
New message ° °Pt called for results//SR  °

## 2014-05-01 ENCOUNTER — Ambulatory Visit: Payer: Managed Care, Other (non HMO) | Admitting: Neurology

## 2014-05-01 ENCOUNTER — Ambulatory Visit: Payer: Managed Care, Other (non HMO) | Admitting: *Deleted

## 2014-05-01 VITALS — BP 128/82 | HR 72 | Ht 65.0 in | Wt 157.0 lb

## 2014-05-01 DIAGNOSIS — M791 Myalgia, unspecified site: Secondary | ICD-10-CM

## 2014-05-01 NOTE — Progress Notes (Signed)
1.) Reason for visit: CHEST PAIN  2.) Name of MD requesting visit: DR  TRACI TURNER  3.) H&P   SEE  BELOW  4.) ROS related to problem: SEE BELOW  5.) Assessment and plan per MD: DR  Tressia Miners TURNER  PT  WALKED  IN    COMPLAINING WITH   CHEST PAIN  FOR   3 DAYS  WORSE  AT NIGHT THIS   WAS  SAME  SYMPTOMS AS  WAS  STATED  AT LAST OFFICE  VISIT AFTER  REVIEWING   PT'S  RECORDS  APPEARS  IS NON COMPLIANT   HAD TAKEN  SEVERAL  WEEKS  TO   GIVE  ECHO RESULTS  AND  PER PT  FELT  FINE  AFTER  WAS FINALLY ABLE TO  REACH  PT DISCUSSED WITH DR TURNER  MAY   TAKE   IBUPOFEN   600 MG  TID  FOR   10 DAYS   AS  WELL AS  PRILOSEC  20 MG  DAILY  FOR  10  DAYS   AND  TO CALL OFFICE  IF NO  BETTER  OR MAY  GO  TO  ER   TODAY FOR  EVAL AND  TX./.  Agree with above statement as outlined above.  Her echo had shown a trivial pericardial effusion in the setting of recent URI c/w viral pericarditis but have not been able to get into contact with patient for treatment with Ibuprofen. Will have her take Ibuprofen 600mg  TID for 10 days along with Prilosec and call when completed to let us know if CP has resolved.  ETT was negative for ischemia.

## 2014-05-01 NOTE — Progress Notes (Signed)
Patient arrived and was prepped for NCS/EMG testing of the legs, but prior to starting the procedure decided not to have it performed.    Kaysey Berndt K. Posey Pronto, DO

## 2014-05-01 NOTE — Patient Instructions (Signed)
Your physician recommends that you schedule a follow-up appointment in: AS NEEDED PT  TO CALL BACK   IN 10 DIF NO IMPROVEMENT   Your physician has recommended you make the following change in your medication:  TAKE  IBUPROFEN  600 MG   THREE TIMES  A DAY  AND  PRILOSEC  20 MG  EVERY DAY   FOR   10 DAYS

## 2014-05-02 NOTE — Progress Notes (Signed)
Please change this generic provider to my name so I can close encounter

## 2014-06-17 ENCOUNTER — Other Ambulatory Visit: Payer: Self-pay | Admitting: Endocrinology

## 2014-07-04 ENCOUNTER — Encounter: Payer: Self-pay | Admitting: Cardiology

## 2014-07-10 ENCOUNTER — Ambulatory Visit: Payer: Managed Care, Other (non HMO) | Admitting: Cardiology

## 2014-07-21 ENCOUNTER — Other Ambulatory Visit: Payer: Self-pay | Admitting: Endocrinology

## 2014-07-27 ENCOUNTER — Encounter: Payer: Self-pay | Admitting: Cardiology

## 2014-08-07 ENCOUNTER — Encounter: Payer: Self-pay | Admitting: Endocrinology

## 2014-08-07 ENCOUNTER — Ambulatory Visit (INDEPENDENT_AMBULATORY_CARE_PROVIDER_SITE_OTHER): Payer: Managed Care, Other (non HMO) | Admitting: Endocrinology

## 2014-08-07 VITALS — BP 132/88 | HR 82 | Temp 98.2°F | Ht 65.0 in | Wt 156.0 lb

## 2014-08-07 DIAGNOSIS — E041 Nontoxic single thyroid nodule: Secondary | ICD-10-CM

## 2014-08-07 MED ORDER — ATORVASTATIN CALCIUM 40 MG PO TABS: 40.0000 mg | ORAL_TABLET | Freq: Every day | ORAL | Status: DC

## 2014-08-07 NOTE — Progress Notes (Signed)
Subjective:    Patient ID: [name], male    DOB: 04-29-1961, 53 y.o.   MRN: [id number]  HPI Pt returns for f/u of right thyroid nodule (dx'ed 2008; bx then showed HYPERPLASTIC NODULE; serial [place] have shown little change; he has been euthyroid).  Pt says he notices no swelling at the anterior neck, or pain. Past Medical History  Diagnosis Date  . ARM PAIN, LEFT 11/20/2009  . BIPOLAR DISORDER UNSPECIFIED 12/13/2007  . CHEST PAIN 03/23/2009  . DEPRESSION 12/13/2007  . FATIGUE 03/23/2009  . GLAUCOMA 09/03/2009  . HEMORRHOIDS 09/03/2009  . HOARSENESS 09/03/2009  . HYPERCHOLESTEROLEMIA 03/11/2010  . MYALGIA 08/13/2009  . NECK PAIN 01/18/2009  . Palpitations 03/23/2009  . Polydipsia 01/24/2008  . Shortness of breath 03/23/2009  . THYROID NODULE 08/28/2008    benign on Bx  . THYROIDITIS 12/13/2007  . GERD (gastroesophageal reflux disease)   . Arthritis     Past Surgical History  Procedure Laterality Date  . Back surgery      x 3, lumbar  . Finger surgery      History   Social History  . Marital Status: Single    Spouse Name: N/A    Number of Children: N/A  . Years of Education: N/A   Occupational History  . Not on file.   Social History Main Topics  . Smoking status: Never Smoker   . Smokeless tobacco: Never Used  . Alcohol Use: No  . Drug Use: No  . Sexual Activity: Not on file   Other Topics Concern  . Not on file   Social History Narrative   Mr Delapena lives with his wife in [place], 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       He is originally form [place]    Current Outpatient Prescriptions on File Prior to Visit  Medication Sig Dispense Refill  . buPROPion (WELLBUTRIN XL) 150 MG 24 hr tablet Take 450 mg by mouth daily.      . calcium elemental as carbonate (TUMS ULTRA 1000) 400 MG tablet Chew 1,000 mg by mouth 3 (three) times daily as needed for heartburn.      Marland Kitchen HYDROcodone-acetaminophen (NORCO/VICODIN) 5-325 MG per tablet Take 1 tablet by  mouth every 6 (six) hours as needed for moderate pain.  30 tablet  0  . hydroxypropyl methylcellulose (ISOPTO TEARS) 2.5 % ophthalmic solution Place 1 drop into both eyes 3 (three) times daily as needed for dry eyes.      . mirtazapine (REMERON) 30 MG tablet Take 30 mg by mouth at bedtime.       . Multiple Vitamin (MULTIVITAMIN) tablet Take 1 tablet by mouth daily.        Vladimir Faster Glycol-Propyl Glycol (SYSTANE) 0.4-0.3 % SOLN Apply to eye.      . Travoprost, BAK Free, (TRAVATAN) 0.004 % SOLN ophthalmic solution Place 1 drop into both eyes at bedtime.       No current facility-administered medications on file prior to visit.    No Known Allergies  Family History  Problem Relation Age of Onset  . Hypertension Other   . Cancer Father     colon  . Dementia Mother     BP 132/88  Pulse 82  Temp(Src) 98.2 F (36.8 C) (Oral)  Ht 5\' 5"  (1.651 m)  Wt 156 lb (70.761 kg)  BMI 25.96 kg/m2  SpO2 95%   Review of Systems Denies weight change    Objective:   Physical  Exam VITAL SIGNS:  See vs page GENERAL: no distress NECK: There is no palpable thyroid enlargement.  No thyroid nodule is palpable.  No palpable lymphadenopathy at the anterior neck.  Lab Results  Component Value Date   TSH 2.528 01/09/2014      Assessment & Plan:  Multinodular goiter, nonpalpable, stable on serial [place], euthyroid.    Patient is advised the following: Patient Instructions  Please continue the same medications.  Please come back for a regular physical appointment in 6 months.

## 2014-08-07 NOTE — Patient Instructions (Signed)
Please continue the same medications.  Please come back for a regular physical appointment in 6 months.

## 2014-09-04 ENCOUNTER — Encounter: Payer: Self-pay | Admitting: Cardiology

## 2014-09-04 ENCOUNTER — Ambulatory Visit (INDEPENDENT_AMBULATORY_CARE_PROVIDER_SITE_OTHER): Payer: Managed Care, Other (non HMO) | Admitting: Cardiology

## 2014-09-04 VITALS — BP 104/78 | HR 68 | Ht 65.0 in | Wt 160.1 lb

## 2014-09-04 DIAGNOSIS — R0789 Other chest pain: Secondary | ICD-10-CM

## 2014-09-04 NOTE — Progress Notes (Signed)
[place], [place], [place]  [address] Phone: [phone] Fax:  [phone]  Date:  09/04/2014   ID:  [name], DOB 1961-09-16, MRN [id number]  PCP:  Renato Shin, MD  Cardiologist:  Fransico Him, MD    History of Present Illness: [name] is a 53 y.o. male withhistory of bipolar disorder, depression, atypical chest pain with normal ETT.  I saw him 02/13/2014 and his chest pain had completely resolved.  He is doing well. He denies any chest pain, SOB, palpitations, dizziness or syncope.  Wt Readings from Last 3 Encounters:  09/04/14 160 lb 1.9 oz (72.63 kg)  08/07/14 156 lb (70.761 kg)  05/01/14 157 lb (71.215 kg)     Past Medical History  Diagnosis Date  . ARM PAIN, LEFT 11/20/2009  . BIPOLAR DISORDER UNSPECIFIED 12/13/2007  . CHEST PAIN 03/23/2009  . DEPRESSION 12/13/2007  . FATIGUE 03/23/2009  . GLAUCOMA 09/03/2009  . HEMORRHOIDS 09/03/2009  . HOARSENESS 09/03/2009  . HYPERCHOLESTEROLEMIA 03/11/2010  . MYALGIA 08/13/2009  . NECK PAIN 01/18/2009  . Palpitations 03/23/2009  . Polydipsia 01/24/2008  . Shortness of breath 03/23/2009  . THYROID NODULE 08/28/2008    benign on Bx  . THYROIDITIS 12/13/2007  . GERD (gastroesophageal reflux disease)   . Arthritis     Current Outpatient Prescriptions  Medication Sig Dispense Refill  . atorvastatin (LIPITOR) 40 MG tablet Take 1 tablet (40 mg total) by mouth daily. 90 tablet 3  . buPROPion (WELLBUTRIN XL) 150 MG 24 hr tablet Take 450 mg by mouth daily.    . calcium elemental as carbonate (TUMS ULTRA 1000) 400 MG tablet Chew 1,000 mg by mouth 3 (three) times daily as needed for heartburn.    Marland Kitchen HYDROcodone-acetaminophen (NORCO/VICODIN) 5-325 MG per tablet Take 1 tablet by mouth every 6 (six) hours as needed for moderate pain. 30 tablet 0  . hydroxypropyl methylcellulose (ISOPTO TEARS) 2.5 % ophthalmic solution Place 1 drop into both eyes 3 (three) times daily as needed for dry eyes.    . mirtazapine (REMERON) 30 MG  tablet Take 30 mg by mouth at bedtime.     . Multiple Vitamin (MULTIVITAMIN) tablet Take 1 tablet by mouth daily.      Vladimir Faster Glycol-Propyl Glycol (SYSTANE) 0.4-0.3 % SOLN Place 1 drop into both eyes daily as needed (dry eyes).     . Travoprost, BAK Free, (TRAVATAN) 0.004 % SOLN ophthalmic solution Place 1 drop into both eyes at bedtime.     No current facility-administered medications for this visit.    Allergies:   No Known Allergies  Social History:  The patient  reports that he has never smoked. He has never used smokeless tobacco. He reports that he does not drink alcohol or use illicit drugs.   Family History:  The patient's family history includes Cancer in his father; Dementia in his mother; Hypertension in his other.   ROS:  Please see the history of present illness.      All other systems reviewed and negative.   PHYSICAL EXAM: VS:  BP 104/78 mmHg  Pulse 68  Ht 5\' 5"  (1.651 m)  Wt 160 lb 1.9 oz (72.63 kg)  BMI 26.65 kg/m2 Well nourished, well developed, in no acute distress HEENT: normal Neck: no JVD Cardiac:  normal S1, S2; RRR; no murmur Lungs:  clear to auscultation bilaterally, no wheezing, rhonchi or rales Abd: soft, nontender, no hepatomegaly Ext: no edema Skin: warm and dry Neuro:  CNs 2-12 intact, no  focal abnormalities noted  ASSESSMENT AND PLAN:  1. Atypical chest pain that sounds noncardiac. He was diagnosed with a viral syndrome shortly after CP started and this has now resolved. EKG is nonischemic and ETT was normal.  2D echo with trivial PE in May.  He feels fine with no complaints.    Followup PRN     Signed, Fransico Him, MD [facility] 09/04/2014 10:57 AM

## 2014-09-04 NOTE — Patient Instructions (Signed)
Your physician recommends that you continue on your current medications as directed. Please refer to the Current Medication list given to you today.  No follow up is needed at this time with Dr. Radford Pax. She will see you on an as needed basis.

## 2014-10-24 ENCOUNTER — Ambulatory Visit (INDEPENDENT_AMBULATORY_CARE_PROVIDER_SITE_OTHER): Payer: Managed Care, Other (non HMO) | Admitting: Family Medicine

## 2014-10-24 VITALS — BP 120/80 | HR 76 | Temp 97.8°F | Resp 16 | Ht 65.25 in | Wt 160.0 lb

## 2014-10-24 DIAGNOSIS — R059 Cough, unspecified: Secondary | ICD-10-CM

## 2014-10-24 DIAGNOSIS — M519 Unspecified thoracic, thoracolumbar and lumbosacral intervertebral disc disorder: Secondary | ICD-10-CM

## 2014-10-24 DIAGNOSIS — J069 Acute upper respiratory infection, unspecified: Secondary | ICD-10-CM

## 2014-10-24 DIAGNOSIS — M545 Low back pain, unspecified: Secondary | ICD-10-CM

## 2014-10-24 DIAGNOSIS — R05 Cough: Secondary | ICD-10-CM

## 2014-10-24 MED ORDER — PREDNISONE 20 MG PO TABS: ORAL_TABLET | ORAL | Status: DC

## 2014-10-24 MED ORDER — HYDROCODONE-ACETAMINOPHEN 5-325 MG PO TABS: ORAL_TABLET | ORAL | Status: DC

## 2014-10-24 MED ORDER — BENZONATATE 100 MG PO CAPS: ORAL_CAPSULE | ORAL | Status: DC

## 2014-10-24 MED ORDER — NAPROXEN 500 MG PO TABS: 500.0000 mg | ORAL_TABLET | Freq: Two times a day (BID) | ORAL | Status: DC

## 2014-10-24 MED ORDER — IPRATROPIUM BROMIDE 0.03 % NA SOLN
NASAL | Status: DC
Start: 2014-10-24 — End: 2016-05-12

## 2014-10-24 NOTE — Patient Instructions (Signed)
Drink plenty of fluids and get enough rest  Stay off of work for the next 3 days. Assuming that you're doing well you can go back to work on Saturday. If you're still hurting a lot on Saturday you will need to come in for a recheck.  Take the naproxen one pill twice daily with breakfast and supper for the pain and inflammation in the back  Take the hydrocodone one every 4-6 hours if needed for severe pain  Take the prednisone 3 pills this evening with food, then beginning tomorrow take 3 on Wednesday morning, 2 on Thursday, 2 on Friday, one on Saturday, more on Sunday. They are best taken after breakfast.  Use the Atrovent (itraponium) nose spray 2 sprays each nostril up to 4 times daily if needed for congestion  Take the Tessalon (benzonatate) pills one or 2 pills 3 times daily as needed for cough.  If you're not getting better or start running a high fever then we may need to reassess you  Return if the back is not improving

## 2014-10-24 NOTE — Progress Notes (Signed)
Subjective: 53 year old man who works at [employer]. Yesterday he was off of work, but started having some low back pain. He doesn't know if he had twisted her strained, but no major injury. He said it gradually got hurting worse through the night. He went on to work this morning, but after several hours they sent him on home. He is a Museum/gallery conservator. He has had a history of back problems in the past and has had a couple of back surgeries. Almost 2 years ago I treated him for similar pain.  Patient also has been having an upper respiratory infection for about 3 days causing him to have head congestion and a headache and some cough.  Objective: TMs are normal. Nose congested. Sinuses not particular tender. Throat clear. Neck supple without nodes. Chest clear to auscultation. Heart regular without murmurs. His back is tender at the left lower lumbar and sacroiliac region. He is tight with difficulty moving. Difficulty getting up and down. Straight leg raising test is positive bilaterally at about 70, left a little worse than the right. He grimaces badly with that. Moves extremities well. No bladder bowel problems.  Assessment: Low back pain and strain, with history of lumbar disc disease  Upper respiratory infection with cough  Plan: Atrovent nasal, Tessalon Naproxen, Flexeril, and prednisone for back

## 2014-11-27 ENCOUNTER — Institutional Professional Consult (permissible substitution): Payer: Managed Care, Other (non HMO) | Admitting: Neurology

## 2014-12-11 ENCOUNTER — Institutional Professional Consult (permissible substitution): Payer: Managed Care, Other (non HMO) | Admitting: Neurology

## 2014-12-12 ENCOUNTER — Encounter (HOSPITAL_COMMUNITY): Payer: Self-pay | Admitting: Adult Health

## 2014-12-12 ENCOUNTER — Emergency Department (HOSPITAL_COMMUNITY)
Admission: EM | Admit: 2014-12-12 | Discharge: 2014-12-12 | Disposition: A | Discharge status: Home / Self Care | Payer: Managed Care, Other (non HMO) | Attending: Emergency Medicine | Admitting: Emergency Medicine

## 2014-12-12 ENCOUNTER — Telehealth: Payer: Self-pay | Admitting: Cardiology

## 2014-12-12 ENCOUNTER — Emergency Department (HOSPITAL_COMMUNITY): Payer: Managed Care, Other (non HMO)

## 2014-12-12 DIAGNOSIS — R079 Chest pain, unspecified: Secondary | ICD-10-CM | POA: Diagnosis present

## 2014-12-12 DIAGNOSIS — Z791 Long term (current) use of non-steroidal anti-inflammatories (NSAID): Secondary | ICD-10-CM | POA: Insufficient documentation

## 2014-12-12 DIAGNOSIS — M1990 Unspecified osteoarthritis, unspecified site: Secondary | ICD-10-CM | POA: Diagnosis not present

## 2014-12-12 DIAGNOSIS — Z7951 Long term (current) use of inhaled steroids: Secondary | ICD-10-CM | POA: Insufficient documentation

## 2014-12-12 DIAGNOSIS — R0789 Other chest pain: Secondary | ICD-10-CM

## 2014-12-12 DIAGNOSIS — Z8719 Personal history of other diseases of the digestive system: Secondary | ICD-10-CM | POA: Diagnosis not present

## 2014-12-12 DIAGNOSIS — M940 Chondrocostal junction syndrome [Tietze]: Secondary | ICD-10-CM | POA: Insufficient documentation

## 2014-12-12 DIAGNOSIS — F319 Bipolar disorder, unspecified: Secondary | ICD-10-CM | POA: Diagnosis not present

## 2014-12-12 DIAGNOSIS — H409 Unspecified glaucoma: Secondary | ICD-10-CM | POA: Diagnosis not present

## 2014-12-12 DIAGNOSIS — E780 Pure hypercholesterolemia: Secondary | ICD-10-CM | POA: Diagnosis not present

## 2014-12-12 DIAGNOSIS — R05 Cough: Secondary | ICD-10-CM

## 2014-12-12 DIAGNOSIS — R059 Cough, unspecified: Secondary | ICD-10-CM

## 2014-12-12 DIAGNOSIS — Z79899 Other long term (current) drug therapy: Secondary | ICD-10-CM | POA: Insufficient documentation

## 2014-12-12 LAB — BASIC METABOLIC PANEL
ANION GAP: 6 (ref 5–15)
BUN: 10 mg/dL (ref 6–23)
CALCIUM: 8.8 mg/dL (ref 8.4–10.5)
CO2: 26 mmol/L (ref 19–32)
CREATININE: 1.02 mg/dL (ref 0.50–1.35)
Chloride: 107 mmol/L (ref 96–112)
GFR calc non Af Amer: 83 mL/min — ABNORMAL LOW (ref >90)
Glucose, Bld: 131 mg/dL — ABNORMAL HIGH (ref 70–99)
Potassium: 3.7 mmol/L (ref 3.5–5.1)
Sodium: 139 mmol/L (ref 135–145)

## 2014-12-12 LAB — CBC
HCT: 40.9 % (ref 39.0–52.0)
HEMOGLOBIN: 14.1 g/dL (ref 13.0–17.0)
MCH: 28.8 pg (ref 26.0–34.0)
MCHC: 34.5 g/dL (ref 30.0–36.0)
MCV: 83.6 fL (ref 78.0–100.0)
Platelets: 235 10*3/uL (ref 150–400)
RBC: 4.89 MIL/uL (ref 4.22–5.81)
RDW: 12.8 % (ref 11.5–15.5)
WBC: 4.5 10*3/uL (ref 4.0–10.5)

## 2014-12-12 LAB — I-STAT TROPONIN, ED: Troponin i, poc: 0 ng/mL (ref 0.00–0.08)

## 2014-12-12 MED ORDER — KETOROLAC TROMETHAMINE 30 MG/ML IJ SOLN
30.0000 mg | Freq: Once | INTRAMUSCULAR | Status: AC
  Administered 2014-12-12: 30 mg via INTRAMUSCULAR
  Filled 2014-12-12: qty 1

## 2014-12-12 MED ORDER — GI COCKTAIL ~~LOC~~
30.0000 mL | Freq: Once | ORAL | Status: AC
  Administered 2014-12-12: 30 mL via ORAL
  Filled 2014-12-12: qty 30

## 2014-12-12 NOTE — ED Notes (Signed)
Pt a/o x 4 on d/c with steady gait. 

## 2014-12-12 NOTE — Discharge Instructions (Signed)
Use tylenol for pain at home. Use heat to the affected areas, 20 minutes at a time every hour. Use tums or pepto bismol as needed for indigestion. Call your cardiologist tomorrow to schedule an appointment for recheck of ongoing symptoms. Return to the ER for changes or worsening symptoms.   Chest Wall Pain Chest wall pain is pain felt in or around the chest bones and muscles. It may take up to 6 weeks to get better. It may take longer if you are active. Chest wall pain can happen on its own. Other times, things like germs, injury, coughing, or exercise can cause the pain. HOME CARE   Avoid activities that make you tired or cause pain. Try not to use your chest, belly (abdominal), or side muscles. Do not use heavy weights.  Put ice on the sore area.  Put ice in a plastic bag.  Place a towel between your skin and the bag.  Leave the ice on for 15-20 minutes for the first 2 days.  Only take medicine as told by your doctor. GET HELP RIGHT AWAY IF:   You have more pain or are very uncomfortable.  You have a fever.  Your chest pain gets worse.  You have new problems.  You feel sick to your stomach (nauseous) or throw up (vomit).  You start to sweat or feel lightheaded.  You have a cough with mucus (phlegm).  You cough up blood. MAKE SURE YOU:   Understand these instructions.  Will watch your condition.  Will get help right away if you are not doing well or get worse. Document Released: 03/31/2008 Document Revised: 01/05/2012 Document Reviewed: 06/09/2011 [facility] Patient Information 2015 [place], [place]. This information is not intended to replace advice given to you by your health care provider. Make sure you discuss any questions you have with your health care provider.  Costochondritis Costochondritis, sometimes called Tietze syndrome, is a swelling and irritation (inflammation) of the tissue (cartilage) that connects your ribs with your breastbone (sternum). It causes  pain in the chest and rib area. Costochondritis usually goes away on its own over time. It can take up to 6 weeks or longer to get better, especially if you are unable to limit your activities. CAUSES  Some cases of costochondritis have no known cause. Possible causes include:  Injury (trauma).  Exercise or activity such as lifting.  Severe coughing. SIGNS AND SYMPTOMS  Pain and tenderness in the chest and rib area.  Pain that gets worse when coughing or taking deep breaths.  Pain that gets worse with specific movements. DIAGNOSIS  Your health care provider will do a physical exam and ask about your symptoms. Chest X-rays or other tests may be done to rule out other problems. TREATMENT  Costochondritis usually goes away on its own over time. Your health care provider may prescribe medicine to help relieve pain. HOME CARE INSTRUCTIONS   Avoid exhausting physical activity. Try not to strain your ribs during normal activity. This would include any activities using chest, abdominal, and side muscles, especially if heavy weights are used.  Apply ice to the affected area for the first 2 days after the pain begins.  Put ice in a plastic bag.  Place a towel between your skin and the bag.  Leave the ice on for 20 minutes, 2-3 times a day.  Only take over-the-counter or prescription medicines as directed by your health care provider. SEEK MEDICAL CARE IF:  You have redness or swelling at the rib  joints. These are signs of infection.  Your pain does not go away despite rest or medicine. SEEK IMMEDIATE MEDICAL CARE IF:   Your pain increases or you are very uncomfortable.  You have shortness of breath or difficulty breathing.  You cough up blood.  You have worse chest pains, sweating, or vomiting.  You have a fever or persistent symptoms for more than 2-3 days.  You have a fever and your symptoms suddenly get worse. MAKE SURE YOU:   Understand these instructions.  Will watch  your condition.  Will get help right away if you are not doing well or get worse. Document Released: 07/23/2005 Document Revised: 08/03/2013 Document Reviewed: 05/17/2013 [facility] Patient Information 2015 [place], [place]. This information is not intended to replace advice given to you by your health care provider. Make sure you discuss any questions you have with your health care provider.

## 2014-12-12 NOTE — ED Provider Notes (Signed)
CSN: [id number]     Arrival date & time 12/12/14  1720 History   First MD Initiated Contact with Patient 12/12/14 2020     Chief Complaint  Patient presents with  . Chest Pain     (Consider location/radiation/quality/duration/timing/severity/associated sxs/prior Treatment) HPI Comments: [name] is a 54 y.o. male with a PMHx of bipolar disorder, depression, HLD, chronic chest pain, glaucoma, hemorrhoids, remote hx of thyroiditis, GERD, and arthritis, who presents to the ED with complaints of 2 days of gradual onset 5/10 left-sided chest pain. He states that on Sunday he noticed while he was at rest that this pain occurred, states that it feels like a sharp "shocking" pain on the left chest, which then radiated to the right chest and has remained constant since Sunday with no known aggravating or alleviating factors given that he has not tried anything prior to arrival. He called his cardiologist's (Dr. Radford Pax) office today who instructed him to be evaluated in the ER. Has noted a cough x1-2 days, dry and nonproductive, and isn't sure if this caused his pain. Denies that his symptoms change with exertion, movement, or breathing. Of note he states he had some neck pain in the paraspinous muscles that had started before his chest pain started, and has improved. Doesn't feel this pain is radiating from his chest, and denies that it's on the lateral aspect of his neck. Denies jaw or back radiation of symptoms. Denies fevers, chills, diaphoresis, lightheadedness, dizziness, HA, DOE, SOB, hemoptysis, wheezing, claudication, PND, orthopnea, recent travel/immobilization/surgeries, family or personal hx of DVT/PE, family hx of cardiac disease aside from HLD, abd pain, N/V/D/C, hematuria, dysuria, myalgias, arthralgias, weakness, paresthesias, numbness, or rashes.  Patient is a 54 y.o. male presenting with chest pain. The history is provided by the patient. No language interpreter was used.  Chest Pain Pain  location:  L chest and R chest Pain quality: sharp ("shocking")   Pain radiates to:  Does not radiate Pain radiates to the back: no   Pain severity:  Moderate (5/10) Onset quality:  Gradual Duration:  2 days Timing:  Constant Progression:  Unchanged Chronicity:  Recurrent Context: at rest   Relieved by:  None tried Worsened by:  Nothing tried Ineffective treatments:  None tried Associated symptoms: cough (x1 day, nonproductive)   Associated symptoms: no abdominal pain, no altered mental status, no anxiety, no back pain, no claudication, no diaphoresis, no dizziness, no dysphagia, no fever, no heartburn, no lower extremity edema, no nausea, no near-syncope, no numbness, no orthopnea, no palpitations, no PND, no shortness of breath, no syncope, not vomiting and no weakness   Risk factors: high cholesterol and male sex   Risk factors: no prior DVT/PE, no smoking and no surgery     Past Medical History  Diagnosis Date  . ARM PAIN, LEFT 11/20/2009  . BIPOLAR DISORDER UNSPECIFIED 12/13/2007  . CHEST PAIN 03/23/2009  . DEPRESSION 12/13/2007  . FATIGUE 03/23/2009  . GLAUCOMA 09/03/2009  . HEMORRHOIDS 09/03/2009  . HOARSENESS 09/03/2009  . HYPERCHOLESTEROLEMIA 03/11/2010  . MYALGIA 08/13/2009  . NECK PAIN 01/18/2009  . Palpitations 03/23/2009  . Polydipsia 01/24/2008  . Shortness of breath 03/23/2009  . THYROID NODULE 08/28/2008    benign on Bx  . THYROIDITIS 12/13/2007  . GERD (gastroesophageal reflux disease)   . Arthritis    Past Surgical History  Procedure Laterality Date  . Back surgery      x 3, lumbar  . Finger surgery     Family History  Problem Relation Age of Onset  . Hypertension Other   . Cancer Father     colon  . Dementia Mother    History  Substance Use Topics  . Smoking status: Never Smoker   . Smokeless tobacco: Never Used  . Alcohol Use: No    Review of Systems  Constitutional: Negative for fever, chills and diaphoresis.  HENT: Negative for congestion,  rhinorrhea and trouble swallowing.   Respiratory: Positive for cough (x1 day, nonproductive). Negative for shortness of breath.   Cardiovascular: Positive for chest pain. Negative for palpitations, orthopnea, claudication, syncope, PND and near-syncope.  Gastrointestinal: Negative for heartburn, nausea, vomiting, abdominal pain, diarrhea and constipation.  Genitourinary: Negative for dysuria and hematuria.  Musculoskeletal: Negative for myalgias, back pain, arthralgias and neck pain.  Skin: Negative for color change.  Neurological: Negative for dizziness, weakness, light-headedness and numbness.  Psychiatric/Behavioral: Negative for confusion.   10 Systems reviewed and are negative for acute change except as noted in the HPI.    Allergies  Review of patient's allergies indicates no known allergies.  Home Medications   Prior to Admission medications   Medication Sig Start Date End Date Taking? Authorizing Provider  atorvastatin (LIPITOR) 40 MG tablet Take 1 tablet (40 mg total) by mouth daily. 08/07/14   Renato Shin, MD  benzonatate (TESSALON) 100 MG capsule Take 1 or 2 pills 3 times daily as needed for cough 10/24/14   Posey Boyer, MD  buPROPion (WELLBUTRIN XL) 150 MG 24 hr tablet Take 450 mg by mouth daily.    Historical Provider, MD  calcium elemental as carbonate (TUMS ULTRA 1000) 400 MG tablet Chew 1,000 mg by mouth 3 (three) times daily as needed for heartburn.    Historical Provider, MD  HYDROcodone-acetaminophen (NORCO/VICODIN) 5-325 MG per tablet Take one every 4-6 hours as needed for severe pain 10/24/14   Posey Boyer, MD  ipratropium (ATROVENT) 0.03 % nasal spray Use 2 sprays each nostril 4 times daily as needed for head congestion 10/24/14   Posey Boyer, MD  mirtazapine (REMERON) 30 MG tablet Take 30 mg by mouth at bedtime.  09/07/11   Historical Provider, MD  Multiple Vitamin (MULTIVITAMIN) tablet Take 1 tablet by mouth daily.      Historical Provider, MD  naproxen  (NAPROSYN) 500 MG tablet Take 1 tablet (500 mg total) by mouth 2 (two) times daily with a meal. 10/24/14   Posey Boyer, MD  predniSONE (DELTASONE) 20 MG tablet Take 3 daily for 2 days, then 2 daily for 2 days, then 1 daily for 2 days. Best taken after breakfast. 10/24/14   Posey Boyer, MD  Travoprost, BAK Free, (TRAVATAN) 0.004 % SOLN ophthalmic solution Place 1 drop into both eyes at bedtime.    Historical Provider, MD   BP 134/85 mmHg  Pulse 76  Temp(Src) 98 F (36.7 C) (Oral)  Resp 20  Ht 5\' 5"  (1.651 m)  Wt 158 lb (71.668 kg)  BMI 26.29 kg/m2  SpO2 100% Physical Exam  Constitutional: He is oriented to person, place, and time. Vital signs are normal. He appears well-developed and well-nourished.  Non-toxic appearance. No distress.  Afebrile, nontoxic, NAD, VSS  HENT:  Head: Normocephalic and atraumatic.  Mouth/Throat: Oropharynx is clear and moist and mucous membranes are normal.  Eyes: Conjunctivae and EOM are normal. Right eye exhibits no discharge. Left eye exhibits no discharge.  Neck: Normal range of motion. Neck supple. No spinous process tenderness and no muscular tenderness present. No  rigidity. Normal range of motion present.  FROM intact without spinous process or paraspinous muscle TTP, no bony stepoffs or deformities, no muscle spasms. No rigidity or meningeal signs. No JVD  Cardiovascular: Normal rate, regular rhythm, normal heart sounds and intact distal pulses.  Exam reveals no gallop and no friction rub.   No murmur heard. RRR, nl s1/s2, no m/r/g, distal pulses intact, no pedal edema   Pulmonary/Chest: Effort normal and breath sounds normal. No respiratory distress. He has no decreased breath sounds. He has no wheezes. He has no rhonchi. He has no rales. He exhibits tenderness. He exhibits no bony tenderness, no crepitus, no deformity and no retraction.    CTAB in all lung fields, no w/r/r, no hypoxia or increased WOB, speaking in full sentences, SpO2 99% on RA    Chest wall mildly TTP anteriorly, mostly over L chest, no crepitus or deformity, no retractions  Abdominal: Soft. Normal appearance and bowel sounds are normal. He exhibits no distension. There is tenderness in the epigastric area. There is no rigidity, no rebound, no guarding, no CVA tenderness, no tenderness at McBurney's point and negative Murphy's sign.    Soft, ND, +BS throughout, with mild epigastric discomfort during exam although states it's not actually painful, no r/g/r, neg murphy's, neg mcburney's, no CVA TTP   Musculoskeletal: Normal range of motion.  MAE x4 Strength and sensation grossly intact in all extremities Distal pulses intact bilaterally Neg homan's bilaterally No pedal edema  Neurological: He is alert and oriented to person, place, and time. He has normal strength. No sensory deficit.  Skin: Skin is warm, dry and intact. No rash noted.  Psychiatric: He has a normal mood and affect.  Nursing note and vitals reviewed.   ED Course  Procedures (including critical care time) Labs Review Labs Reviewed  BASIC METABOLIC PANEL - Abnormal; Notable for the following:    Glucose, Bld 131 (*)    GFR calc non Af Amer 83 (*)    All other components within normal limits  CBC  I-STAT TROPOININ, ED    Imaging Review Dg Chest 2 View  12/12/2014   CLINICAL DATA:  Chest pain for 2 days  EXAM: CHEST  2 VIEW  COMPARISON:  01/20/2014  FINDINGS: Cardiac shadow is within normal limits. The lungs are clear bilaterally. No focal infiltrate or sizable effusion is noted. No bony abnormality is seen.  IMPRESSION: No active cardiopulmonary disease.   Electronically Signed   By: Inez Catalina M.D.   On: 12/12/2014 18:23     EKG Interpretation   Date/Time:  Tuesday December 12 2014 17:22:32 EST Ventricular Rate:  87 PR Interval:  168 QRS Duration: 70 QT Interval:  346 QTC Calculation: 416 R Axis:   25 Text Interpretation:  Normal sinus rhythm Normal ECG No significant change  since  last tracing Confirmed by GOLDSTON  MD, SCOTT ([id number]) on 12/12/2014  8:10:58 PM       Exercise tolerance test 02/2014: normal  ECHO 02/2014: Study Conclusions  - Left ventricle: The cavity size was normal. Systolic function was normal. Wall motion was normal; there were no regional wall motion abnormalities. - Atrial septum: No defect or patent foramen ovale was identified. - Pericardium, extracardiac: A trivial pericardial effusion was identified posterior to the heart. MDM   Final diagnoses:  Anterior chest wall pain  Costochondritis  Cough    54 y.o. male with reproducible CP x2 days. Sees cardiology who sent him here per telephone note. Exam reveals chest  wall tenderness anteriorly, no crepitus or deformity. Trop neg, CBC WNL, BMP with mildly elevated glucose but otherwise WNL, EKG NSR, CXR WNL. Has had similar CP in the past in 2015, seen by Dr. Radford Pax, had neg exercise stress test and echo, and notes indicate this didn't seem ischemic or cardiac in nature. Since Dr. Theodosia Blender telephone note from today seems to indicate that she may have wanted to be notified of patient's arrival, will talk to [facility] to discuss that this seems musculoskeletal. Will give GI cocktail now and toradol for pain but doubt need for NTG, ASA, or morphine. Will reassess shortly.   9:23 PM Dr. Elias Else returning page, states that given unchanged symptoms x2 days, no delta trop needed, and can d/c home with tylenol and heat to the area and f/up with Dr. Radford Pax as outpt. Doesn't feel he needs further treatment or evaluation today.   10:16 PM Feels much better after toradol and GI cocktail. Will have him f/up with Dr. Radford Pax, use heat and tylenol over area of pain, and use tums as needed for indigestion. I explained the diagnosis and have given explicit precautions to return to the ER including for any other new or worsening symptoms. The patient understands and accepts the medical plan as it's been dictated  and I have answered their questions. Discharge instructions concerning home care and prescriptions have been given. The patient is STABLE and is discharged to home in good condition.  BP 135/90 mmHg  Pulse 74  Temp(Src) 98 F (36.7 C) (Oral)  Resp 18  Ht 5\' 5"  (1.651 m)  Wt 158 lb (71.668 kg)  BMI 26.29 kg/m2  SpO2 100%  Meds ordered this encounter  Medications  . gi cocktail (Maalox,Lidocaine,Donnatal)    Sig:   . ketorolac (TORADOL) 30 MG/ML injection 30 mg    Sig:      Patty Sermons [place], PA-C 12/12/14 2219  Ephraim Hamburger, MD 12/21/14 [phone]

## 2014-12-12 NOTE — Telephone Encounter (Signed)
Pt C/O of chest pain for the last 2 days. Pt described it as a constant pain in the left side of the chest and neck. The pain sometimes is less then others "5 or 6" Pt denies pain radiating to left arm. Pt states that this chest pain is different from the one  before. Last night pt had an episode of fast hear rate while sleep, and chest pain it woke him up. He denies having fast heart rate now ,just chest pain and some dizziness. Dr Radford Pax aware of pt's symptoms; she recommends for pt to go to the ER. Pt aware he states will go to [facility] ER. Birdie Sons card master paged to let her know of pt's arrival to the ER. Rhonda respond for Trish she is aware.

## 2014-12-12 NOTE — ED Notes (Signed)
PRESENTS WITH LEFT SIDED CHEST PAIN DESCRIBED AS SHARP AND ELECTRIC SHOCK PAIN BEGAN Sunday AND IS WORSE AT TIMES, PAIN RADIATES FROM LEFT TO RIGHT SIDE. DENIES NASUEA, DIZZINESS AND sob. NOTHING MAKES PAIN BETTER, NOTHING MAKES PAIN WORSE.

## 2014-12-12 NOTE — Telephone Encounter (Signed)
New Message  Pt c/o of Chest Pain: 1. Are you having CP right now? yes 2. Are you experiencing any other symptoms (ex. SOB, nausea, vomiting, sweating)? Little dizzy-  3. How long have you been experiencing CP? 2 days 4. Is your CP continuous or coming and going? Continuous- feels worse at times but continual pain.  5. Have you taken Nitroglycerin? No

## 2015-01-08 ENCOUNTER — Ambulatory Visit
Admission: RE | Admit: 2015-01-08 | Discharge: 2015-01-08 | Disposition: A | Discharge status: Home / Self Care | Payer: Managed Care, Other (non HMO) | Source: Ambulatory Visit | Attending: Endocrinology | Admitting: Endocrinology

## 2015-01-08 ENCOUNTER — Ambulatory Visit (INDEPENDENT_AMBULATORY_CARE_PROVIDER_SITE_OTHER): Payer: Managed Care, Other (non HMO) | Admitting: Endocrinology

## 2015-01-08 ENCOUNTER — Encounter: Payer: Self-pay | Admitting: Endocrinology

## 2015-01-08 VITALS — BP 122/80 | HR 78 | Temp 98.6°F | Ht 65.0 in | Wt 160.0 lb

## 2015-01-08 DIAGNOSIS — Z129 Encounter for screening for malignant neoplasm, site unspecified: Secondary | ICD-10-CM | POA: Insufficient documentation

## 2015-01-08 DIAGNOSIS — M79644 Pain in right finger(s): Secondary | ICD-10-CM

## 2015-01-08 DIAGNOSIS — R358 Other polyuria: Secondary | ICD-10-CM

## 2015-01-08 DIAGNOSIS — R3589 Other polyuria: Secondary | ICD-10-CM

## 2015-01-08 LAB — URINALYSIS, ROUTINE W REFLEX MICROSCOPIC
Bilirubin Urine: NEGATIVE
HGB URINE DIPSTICK: NEGATIVE
Ketones, ur: NEGATIVE
LEUKOCYTES UA: NEGATIVE
NITRITE: NEGATIVE
Specific Gravity, Urine: 1.01 (ref 1.000–1.030)
Total Protein, Urine: NEGATIVE
Urine Glucose: NEGATIVE
Urobilinogen, UA: 0.2 (ref 0.0–1.0)
WBC, UA: NONE SEEN (ref 0–?)
pH: 6.5 (ref 5.0–8.0)

## 2015-01-08 MED ORDER — OMEPRAZOLE 40 MG PO CPDR: 40.0000 mg | DELAYED_RELEASE_CAPSULE | Freq: Every day | ORAL | Status: DC

## 2015-01-08 NOTE — Progress Notes (Signed)
Subjective:    Patient ID: [name], male    DOB: 1961/08/04, 54 y.o.   MRN: [id number]  HPI Pt states 2 weeks of moderate pain at the right thumb, and assoc swelling.  This happened when he reached for a paper on the ground, and accidentally struck the thumb on the ground. Past Medical History  Diagnosis Date  . ARM PAIN, LEFT 11/20/2009  . BIPOLAR DISORDER UNSPECIFIED 12/13/2007  . CHEST PAIN 03/23/2009  . DEPRESSION 12/13/2007  . FATIGUE 03/23/2009  . GLAUCOMA 09/03/2009  . HEMORRHOIDS 09/03/2009  . HOARSENESS 09/03/2009  . HYPERCHOLESTEROLEMIA 03/11/2010  . MYALGIA 08/13/2009  . NECK PAIN 01/18/2009  . Palpitations 03/23/2009  . Polydipsia 01/24/2008  . Shortness of breath 03/23/2009  . THYROID NODULE 08/28/2008    benign on Bx  . THYROIDITIS 12/13/2007  . GERD (gastroesophageal reflux disease)   . Arthritis     Past Surgical History  Procedure Laterality Date  . Back surgery      x 3, lumbar  . Finger surgery      History   Social History  . Marital Status: Single    Spouse Name: N/A  . Number of Children: N/A  . Years of Education: N/A   Occupational History  . Not on file.   Social History Main Topics  . Smoking status: Never Smoker   . Smokeless tobacco: Never Used  . Alcohol Use: No  . Drug Use: No  . Sexual Activity: Not on file   Other Topics Concern  . 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       He is originally form [place]    Current Outpatient Prescriptions on File Prior to Visit  Medication Sig Dispense Refill  . atorvastatin (LIPITOR) 40 MG tablet Take 1 tablet (40 mg total) by mouth daily. 90 tablet 3  . buPROPion (WELLBUTRIN XL) 150 MG 24 hr tablet Take 450 mg by mouth daily.    Marland Kitchen ibuprofen (ADVIL,MOTRIN) 200 MG tablet Take 400 mg by mouth every 6 (six) hours as needed.    Marland Kitchen ipratropium (ATROVENT) 0.03 % nasal spray Use 2 sprays each  nostril 4 times daily as needed for head congestion 30 mL 0  . Multiple Vitamin (MULTIVITAMIN) tablet Take 1 tablet by mouth daily.      . Travoprost, BAK Free, (TRAVATAN) 0.004 % SOLN ophthalmic solution Place 1 drop into both eyes at bedtime.     No current facility-administered medications on file prior to visit.    No Known Allergies  Family History  Problem Relation Age of Onset  . Hypertension Other   . Cancer Father     colon  . Dementia Mother     BP 122/80 mmHg  Pulse 78  Temp(Src) 98.6 F (37 C) (Oral)  Ht 5\' 5"  (1.651 m)  Wt 160 lb (72.576 kg)  BMI 26.63 kg/m2  SpO2 95%  Review of Systems Denies numbness.  Denies neck swelling.  He has heartburn at night, and assoc cough.  He sometimes expresses fluid from the nose and mouth at night.  He has urinary frequency.  Denies BRBPR, dysphagia, and weight loss.     Objective:   Physical Exam VITAL SIGNS:  See vs page GENERAL: no distress NECK: There is no palpable thyroid enlargement.  No thyroid nodule is palpable.  No palpable lymphadenopathy at the anterior neck.  Right thumb: normal except slight swelling and tenderness at the MCP joint.   UA=normal  X-ray: Mild osteoarthritis      Assessment & Plan:  Thumb sprain, new.  Polyuria, new, uncertain etiology.  Heartburn, new.  No worrisome sxs.    Patient is advised the following: Patient Instructions  i have sent a prescription to your pharmacy, for the heartburn. X-rays and a urine test, are requested for you today.  We'll let you know about the results. i think you can wait until next year to recheck the thyroid ultrasound.   Please come back next week, for a regular physical.    addendum: Please let me know if you want to see a specialist.

## 2015-01-08 NOTE — Patient Instructions (Addendum)
i have sent a prescription to your pharmacy, for the heartburn. X-rays and a urine test, are requested for you today.  We'll let you know about the results. i think you can wait until next year to recheck the thyroid ultrasound.   Please come back next week, for a regular physical.

## 2015-01-15 ENCOUNTER — Ambulatory Visit (INDEPENDENT_AMBULATORY_CARE_PROVIDER_SITE_OTHER): Payer: Managed Care, Other (non HMO) | Admitting: Endocrinology

## 2015-01-15 ENCOUNTER — Encounter: Payer: Self-pay | Admitting: Endocrinology

## 2015-01-15 VITALS — BP 132/84 | HR 80 | Temp 98.5°F | Ht 65.0 in | Wt 161.0 lb

## 2015-01-15 DIAGNOSIS — Z119 Encounter for screening for infectious and parasitic diseases, unspecified: Secondary | ICD-10-CM

## 2015-01-15 DIAGNOSIS — Z0000 Encounter for general adult medical examination without abnormal findings: Secondary | ICD-10-CM | POA: Diagnosis not present

## 2015-01-15 DIAGNOSIS — K769 Liver disease, unspecified: Secondary | ICD-10-CM

## 2015-01-15 DIAGNOSIS — Z125 Encounter for screening for malignant neoplasm of prostate: Secondary | ICD-10-CM

## 2015-01-15 DIAGNOSIS — E069 Thyroiditis, unspecified: Secondary | ICD-10-CM | POA: Diagnosis not present

## 2015-01-15 DIAGNOSIS — Z23 Encounter for immunization: Secondary | ICD-10-CM

## 2015-01-15 DIAGNOSIS — Z129 Encounter for screening for malignant neoplasm, site unspecified: Secondary | ICD-10-CM

## 2015-01-15 NOTE — Patient Instructions (Addendum)
please consider these measures for your health:  minimize alcohol.  do not use tobacco products.  have a colonoscopy at least every 10 years from age 52.  keep firearms safely stored.  always use seat belts.  have working smoke alarms in your home.  see an eye doctor and dentist regularly.  never drive under the influence of alcohol or drugs (including prescription drugs).  blood tests are being requested for you today.  We'll let you know about the results.   Please let me know if you want to see a specialist for your thumb.   Please return in 1 year.

## 2015-01-15 NOTE — Progress Notes (Signed)
Subjective:    Patient ID: [name], male    DOB: 22-Aug-1961, 54 y.o.   MRN: [id number]  HPI Pt is here for regular wellness examination, and is feeling pretty well in general, and says chronic med probs are stable, except as noted below Past Medical History  Diagnosis Date  . ARM PAIN, LEFT 11/20/2009  . BIPOLAR DISORDER UNSPECIFIED 12/13/2007  . CHEST PAIN 03/23/2009  . DEPRESSION 12/13/2007  . FATIGUE 03/23/2009  . GLAUCOMA 09/03/2009  . HEMORRHOIDS 09/03/2009  . HOARSENESS 09/03/2009  . HYPERCHOLESTEROLEMIA 03/11/2010  . MYALGIA 08/13/2009  . NECK PAIN 01/18/2009  . Palpitations 03/23/2009  . Polydipsia 01/24/2008  . Shortness of breath 03/23/2009  . THYROID NODULE 08/28/2008    benign on Bx  . THYROIDITIS 12/13/2007  . GERD (gastroesophageal reflux disease)   . Arthritis     Past Surgical History  Procedure Laterality Date  . Back surgery      x 3, lumbar  . Finger surgery      History   Social History  . Marital Status: Single    Spouse Name: N/A  . Number of Children: N/A  . Years of Education: N/A   Occupational History  . Not on file.   Social History Main Topics  . Smoking status: Never Smoker   . Smokeless tobacco: Never Used  . Alcohol Use: No  . Drug Use: No  . Sexual Activity: Not on file   Other Topics Concern  . Not on file   Social History Narrative   [name] lives with his wife in [place], 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       He is originally form [place]    Current Outpatient Prescriptions on File Prior to Visit  Medication Sig Dispense Refill  . atorvastatin (LIPITOR) 40 MG tablet Take 1 tablet (40 mg total) by mouth daily. 90 tablet 3  . buPROPion (WELLBUTRIN XL) 150 MG 24 hr tablet Take 450 mg by mouth daily.    Marland Kitchen ibuprofen (ADVIL,MOTRIN) 200 MG tablet Take 400 mg by mouth every 6 (six) hours as needed.    Marland Kitchen ipratropium (ATROVENT) 0.03 % nasal spray Use 2 sprays each nostril 4 times daily as needed for  head congestion 30 mL 0  . Multiple Vitamin (MULTIVITAMIN) tablet Take 1 tablet by mouth daily.      Marland Kitchen omeprazole (PRILOSEC) 40 MG capsule Take 1 capsule (40 mg total) by mouth daily. 30 capsule 3  . Travoprost, BAK Free, (TRAVATAN) 0.004 % SOLN ophthalmic solution Place 1 drop into both eyes at bedtime.     No current facility-administered medications on file prior to visit.    No Known Allergies  Family History  Problem Relation Age of Onset  . Hypertension Other   . Cancer Father     colon  . Dementia Mother     BP 132/84 mmHg  Pulse 80  Temp(Src) 98.5 F (36.9 C) (Oral)  Ht 5\' 5"  (1.651 m)  Wt 161 lb (73.029 kg)  BMI 26.79 kg/m2  SpO2 94%  Review of Systems  Constitutional: Negative for fever and unexpected weight change.  HENT: Negative for hearing loss.   Eyes: Negative for visual disturbance.  Respiratory: Negative for shortness of breath.   Cardiovascular: Negative for chest pain.  Gastrointestinal: Negative for anal bleeding.  Endocrine: Negative for cold intolerance.  Genitourinary: Negative for hematuria.  Musculoskeletal: Negative for back pain.  Skin: Negative for rash.  Allergic/Immunologic: Negative for  environmental allergies.  Neurological: Negative for headaches.  Hematological: Does not bruise/bleed easily.  Psychiatric/Behavioral: Negative for dysphoric mood.   Thumb pain is improved, but persists.      Objective:   Physical Exam VS: see vs page GEN: no distress HEAD: head: no deformity eyes: no periorbital swelling, no proptosis external nose and ears are normal mouth: no lesion seen NECK: supple, thyroid is not enlarged CHEST WALL: no deformity LUNGS: clear to auscultation BREASTS:  No gynecomastia CV: reg rate and rhythm, no murmur ABD: abdomen is soft, nontender.  no hepatosplenomegaly.  not distended.  no hernia RECTAL/PROSTATE: declined MUSCULOSKELETAL: muscle bulk and strength are grossly normal.  no obvious joint swelling.  gait  is normal and steady EXTEMITIES: no deformity.  no ulcer on the feet.  feet are of normal color and temp.  no edema PULSES: dorsalis pedis intact bilat.  no carotid bruit NEURO:  cn 2-12 grossly intact.   readily moves all 4's.  sensation is intact to touch on the feet SKIN:  Normal texture and temperature.  No rash or suspicious lesion is visible.   NODES:  None palpable at the neck PSYCH: alert, well-oriented.  Does not appear anxious nor depressed.  Lab Results  Component Value Date   ALT 56* 01/15/2015   AST 43* 01/15/2015   ALKPHOS 94 01/15/2015   BILITOT 0.7 01/15/2015       Assessment & Plan:  Wellness visit today, with problems stable, except as noted. NASH, recurrent Thumb pain, persistent  Patient is advised the following: Patient Instructions  please consider these measures for your health:  minimize alcohol.  do not use tobacco products.  have a colonoscopy at least every 10 years from age 35.  keep firearms safely stored.  always use seat belts.  have working smoke alarms in your home.  see an eye doctor and dentist regularly.  never drive under the influence of alcohol or drugs (including prescription drugs).  blood tests are being requested for you today.  We'll let you know about the results.   Please let me know if you want to see a specialist for your thumb.   Please return in 1 year.  addendum: I advised weight loss.

## 2015-01-16 LAB — BASIC METABOLIC PANEL
BUN: 12 mg/dL (ref 6–23)
CO2: 28 meq/L (ref 19–32)
Calcium: 9.2 mg/dL (ref 8.4–10.5)
Chloride: 105 mEq/L (ref 96–112)
Creatinine, Ser: 1.06 mg/dL (ref 0.40–1.50)
GFR: 78.12 mL/min (ref >60.00)
GLUCOSE: 91 mg/dL (ref 70–99)
POTASSIUM: 3.7 meq/L (ref 3.5–5.1)
Sodium: 137 mEq/L (ref 135–145)

## 2015-01-16 LAB — HEPATIC FUNCTION PANEL
ALT: 56 U/L — AB (ref 0–53)
AST: 43 U/L — ABNORMAL HIGH (ref 0–37)
Albumin: 4.1 g/dL (ref 3.5–5.2)
Alkaline Phosphatase: 94 U/L (ref 39–117)
BILIRUBIN TOTAL: 0.7 mg/dL (ref 0.2–1.2)
Bilirubin, Direct: 0.1 mg/dL (ref 0.0–0.3)
Total Protein: 6.9 g/dL (ref 6.0–8.3)

## 2015-01-16 LAB — TSH: TSH: 1.72 u[IU]/mL (ref 0.35–4.50)

## 2015-01-16 LAB — URINALYSIS, ROUTINE W REFLEX MICROSCOPIC
BILIRUBIN URINE: NEGATIVE
HGB URINE DIPSTICK: NEGATIVE
KETONES UR: NEGATIVE
Leukocytes, UA: NEGATIVE
Nitrite: NEGATIVE
Specific Gravity, Urine: <=1.005 — AB (ref 1.000–1.030)
TOTAL PROTEIN, URINE-UPE24: NEGATIVE
URINE GLUCOSE: NEGATIVE
Urobilinogen, UA: 0.2 (ref 0.0–1.0)
pH: 6 (ref 5.0–8.0)

## 2015-01-16 LAB — CBC WITH DIFFERENTIAL/PLATELET
BASOS ABS: 0 10*3/uL (ref 0.0–0.1)
BASOS PCT: 0.4 % (ref 0.0–3.0)
EOS ABS: 0.1 10*3/uL (ref 0.0–0.7)
Eosinophils Relative: 1.2 % (ref 0.0–5.0)
HEMATOCRIT: 43.2 % (ref 39.0–52.0)
HEMOGLOBIN: 14.7 g/dL (ref 13.0–17.0)
LYMPHS ABS: 3.4 10*3/uL (ref 0.7–4.0)
MCHC: 33.9 g/dL (ref 30.0–36.0)
MCV: 83.4 fl (ref 78.0–100.0)
Monocytes Absolute: 0.5 10*3/uL (ref 0.1–1.0)
Monocytes Relative: 8.4 % (ref 3.0–12.0)
NEUTROS ABS: 1.9 10*3/uL (ref 1.4–7.7)
Neutrophils Relative %: 32.3 % — ABNORMAL LOW (ref 43.0–77.0)
Platelets: 269 10*3/uL (ref 150.0–400.0)
RBC: 5.18 Mil/uL (ref 4.22–5.81)
RDW: 13.7 % (ref 11.5–15.5)
WBC: 6 10*3/uL (ref 4.0–10.5)

## 2015-01-16 LAB — LIPID PANEL
CHOLESTEROL: 138 mg/dL (ref 0–200)
HDL: 74.6 mg/dL (ref >39.00)
LDL CALC: 30 mg/dL (ref 0–99)
NonHDL: 63.4
TRIGLYCERIDES: 166 mg/dL — AB (ref 0.0–149.0)
Total CHOL/HDL Ratio: 2
VLDL: 33.2 mg/dL (ref 0.0–40.0)

## 2015-01-16 LAB — PSA: PSA: 0.7 ng/mL (ref 0.10–4.00)

## 2015-01-17 LAB — HIV ANTIBODY (ROUTINE TESTING W REFLEX): HIV 1&2 Ab, 4th Generation: NONREACTIVE

## 2015-02-05 ENCOUNTER — Other Ambulatory Visit: Payer: Self-pay | Admitting: Surgery

## 2015-02-05 NOTE — H&P (Signed)
Anel Purohit 02/05/2015 4:05 PM Location: [facility] Patient #: [id number] DOB: 08-03-1961 Single / Language: [name] / Race: White Male  History of Present Illness [name] MD; 02/05/2015 5:56 PM) The patient is a 54 year old male who presents with anal pain. Pleasant patient initially from [place]. Patient returns 2 and a half years status post on seeing him last time. He still gets intermittent anal pain. I had diagnosed hemorrhoids 3 years ago and did banding. 3 months later he seemed to have a fissure. I recommended diltiazem gel. I recommended surgery if it did not improve. We called him several times with no answer. Patient tells me he never heard back from [place]. He do not fill diltiazem Rx. He did not call us back.  He still struggles with intermittent bleeding and pain. Occasionally feels something pop out. Usually reduces back and. Claims to have a bowel movement about every day or so. Not on any fiber supplement. Was supposed to get screening colonoscopy last year but postponed since he could not find a ride home. Patient notes he gets discomfort burning and bleeding with bowel movements. Occasionally sharp but not severe. Then gets asymptomatic for a month or so and then comes back. Because of persistent and worsening symptoms, he wished to be reevaluated. History of numerous lumbar and cervical spine issues requiring surgery. Has used narcotics in the past. I do not think he's on anything currently. 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   Other Problems Elbert Ewings, CMA; 02/05/2015 4:05 PM) Anxiety Disorder Arthritis Back Pain Depression Gastroesophageal Reflux Disease Hemorrhoids Hypercholesterolemia Thyroid Disease  Past Surgical History Elbert Ewings, CMA; 02/05/2015 4:05 PM) Spinal Surgery - Lower Back  Allergies Elbert Ewings, CMA; 02/05/2015 4:05 PM) No Known Drug  Allergies04/08/2015  Medication History Elbert Ewings, CMA; 02/05/2015 4:07 PM) Lipitor (40MG  Tablet, Oral) Active. Wellbutrin SR (150MG  Tablet ER 12HR, Oral) Active. Atrovent (0.03% Solution, Nasal) Active. Multivitamins (Oral) Active. Omeprazole (40MG  Capsule DR, Oral) Active. Travatan Z (0.004% Solution, Ophthalmic) Active. Medications Reconciled  Social History Elbert Ewings, [place]; 02/05/2015 4:05 PM) Caffeine use Coffee, Tea. No alcohol use No drug use Tobacco use Never smoker.  Family History Elbert Ewings, [place]; 02/05/2015 4:05 PM) Colon Cancer Father. Depression Sister.  Review of Systems Elbert Ewings CMA; 02/05/2015 4:05 PM) General Present- Fatigue. Not Present- Appetite Loss, Chills, Fever, Night Sweats, Weight Gain and Weight Loss. Skin Not Present- Change in Wart/Mole, Dryness, Hives, Jaundice, New Lesions, Non-Healing Wounds, Rash and Ulcer. HEENT Present- Seasonal Allergies. Not Present- Earache, Hearing Loss, Hoarseness, Nose Bleed, Oral Ulcers, Ringing in the Ears, Sinus Pain, Sore Throat, Visual Disturbances, Wears glasses/contact lenses and Yellow Eyes. Respiratory Present- Snoring. Not Present- Bloody sputum, Chronic Cough, Difficulty Breathing and Wheezing. Cardiovascular Not Present- Chest Pain, Difficulty Breathing Lying Down, Leg Cramps, Palpitations, Rapid Heart Rate, Shortness of Breath and Swelling of Extremities. Gastrointestinal Present- Change in Bowel Habits, Excessive gas and Hemorrhoids. Not Present- Abdominal Pain, Bloating, Bloody Stool, Chronic diarrhea, Constipation, Difficulty Swallowing, Gets full quickly at meals, Indigestion, Nausea, Rectal Pain and Vomiting. Male Genitourinary Not Present- Blood in Urine, Change in Urinary Stream, Frequency, Impotence, Nocturia, Painful Urination, Urgency and Urine Leakage.   Vitals Elbert Ewings CMA; 02/05/2015 4:08 PM) 02/05/2015 4:07 PM Weight: 158 lb Height: 65in Body Surface Area: 1.81 m Body  Mass Index: 26.29 kg/m Temp.: 97.58F(Oral)  Pulse: 80 (Regular)  Resp.: 18 (Unlabored)  BP: 140/66 (Sitting, Left Arm, Standard)  Assessment & Plan [name] MD; 02/05/2015 4:49 PM) COMPLEX THIRD DEGREE HEMORRHOID (455.8  K64.2) Impression: Persistent anal pain and discomfort and occasional bleeding and prolapse suspicious for hemorrhoids. No strong evidence of fissure although anterior anal canal with some abnormalities.  I think this patient wouldn't benefit from examination under anesthesia with hemorrhoidal ligation and pexy. He is too sensitive to tolerate interoffice procedures. He has persistent symptoms despite banding done years ago.  I recommend he get screening colonoscopy done first since he is overdue and make sure there is no surprises there.  The biggest challenges he tends to disappear or not follow through with recommendations. Hopefully he will be compliant with this. We will try and remind him more consistently. Current Plans  Schedule for Surgery Written instructions provided Discussed regular exercise with patient. The anatomy & physiology of the anorectal region was discussed. The pathophysiology of hemorrhoids and differential diagnosis was discussed. Natural history risks without surgery was discussed. I stressed the importance of a bowel regimen to have daily soft bowel movements to minimize progression of disease. Interventions such as sclerotherapy & banding were discussed.  The patient's symptoms are not adequately controlled by medicines and other non-operative treatments. I feel the risks & problems of no surgery outweigh the operative risks; therefore, I recommended surgery to treat the hemorrhoids by ligation, pexy, and possible resection.  Risks such as bleeding, infection, urinary difficulties, need for further treatment, heart attack, death, and other risks were discussed. I noted a good likelihood this will help address the problem. Goals  of post-operative recovery were discussed as well. Possibility that this will not correct all symptoms was explained. Post-operative pain, bleeding, constipation, and other problems after surgery were discussed. We will work to minimize complications. Educational handouts further explaining the pathology, treatment options, and bowel regimen were given as well. Questions were answered. The patient expresses understanding & wishes to proceed with surgery. Pt Education - [facility] Hemorrhoids (Zakaiya Lares) Pt Education - [facility] Rectal Surgery HCI (Hektor Huston): discussed with patient and provided information. Pt Education - [facility] Pelvic Floor Exercises (Kegels) and Dysfunction HCI (Shota Kohrs) Pt Education - [facility] Good Bowel Health (Callaway Hardigree) Pt Education - [facility] Pain Control (Kinsleigh Ludolph)  [name], M.D., F.A.C.S. Gastrointestinal and Minimally Invasive Surgery [facility] [address] [place], [place] [address] [phone] Main / Paging

## 2015-02-14 ENCOUNTER — Ambulatory Visit: Payer: Managed Care, Other (non HMO)

## 2015-02-20 ENCOUNTER — Encounter: Payer: Self-pay | Admitting: Internal Medicine

## 2015-02-21 ENCOUNTER — Ambulatory Visit (INDEPENDENT_AMBULATORY_CARE_PROVIDER_SITE_OTHER): Payer: Managed Care, Other (non HMO)

## 2015-02-21 DIAGNOSIS — Z23 Encounter for immunization: Secondary | ICD-10-CM

## 2015-04-25 ENCOUNTER — Ambulatory Visit (INDEPENDENT_AMBULATORY_CARE_PROVIDER_SITE_OTHER): Payer: Managed Care, Other (non HMO) | Admitting: Endocrinology

## 2015-04-25 ENCOUNTER — Encounter: Payer: Self-pay | Admitting: Endocrinology

## 2015-04-25 VITALS — BP 132/85 | HR 73 | Temp 98.3°F | Ht 65.0 in | Wt 160.0 lb

## 2015-04-25 DIAGNOSIS — N529 Male erectile dysfunction, unspecified: Secondary | ICD-10-CM | POA: Diagnosis not present

## 2015-04-25 LAB — TSH: TSH: 4.08 u[IU]/mL (ref 0.35–4.50)

## 2015-04-25 LAB — TESTOSTERONE: Testosterone: 358.46 ng/dL (ref 300.00–890.00)

## 2015-04-25 MED ORDER — OMEPRAZOLE 40 MG PO CPDR: 40.0000 mg | DELAYED_RELEASE_CAPSULE | Freq: Every day | ORAL | Status: DC

## 2015-04-25 MED ORDER — SILDENAFIL CITRATE 20 MG PO TABS: ORAL_TABLET | ORAL | Status: DC

## 2015-04-25 NOTE — Patient Instructions (Addendum)
blood tests are requested for you today.  We'll let you know about the results. i have sent 2 prescriptions to your pharmacy: viagra and a refill of the heartburn pill.   Please come back for a follow-up appointment in 6 months.

## 2015-04-25 NOTE — Progress Notes (Signed)
Subjective:    Patient ID: [name], male    DOB: 11-12-1960, 54 y.o.   MRN: [id number]  HPI The state of at least three ongoing medical problems is addressed today, with interval history of each noted here: GERD sxs persist.  He does not take prilosec.   Denies weight change.   Thyroiditis: he denies neck pain.   Past Medical History  Diagnosis Date  . ARM PAIN, LEFT 11/20/2009  . BIPOLAR DISORDER UNSPECIFIED 12/13/2007  . CHEST PAIN 03/23/2009  . DEPRESSION 12/13/2007  . FATIGUE 03/23/2009  . GLAUCOMA 09/03/2009  . HEMORRHOIDS 09/03/2009  . HOARSENESS 09/03/2009  . HYPERCHOLESTEROLEMIA 03/11/2010  . MYALGIA 08/13/2009  . NECK PAIN 01/18/2009  . Palpitations 03/23/2009  . Polydipsia 01/24/2008  . Shortness of breath 03/23/2009  . THYROID NODULE 08/28/2008    benign on Bx  . THYROIDITIS 12/13/2007  . GERD (gastroesophageal reflux disease)   . Arthritis     Past Surgical History  Procedure Laterality Date  . Back surgery      x 3, lumbar  . Finger surgery      History   Social History  . Marital Status: Single    Spouse Name: N/A  . Number of Children: N/A  . Years of Education: N/A   Occupational History  . Not on file.   Social History Main Topics  . Smoking status: Never Smoker   . Smokeless tobacco: Never Used  . Alcohol Use: No  . Drug Use: No  . Sexual Activity: Not on file   Other Topics Concern  . Not on file   Social History Narrative   Mr Hackman lives with his wife in [place], 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       He is originally form [place]    Current Outpatient Prescriptions on File Prior to Visit  Medication Sig Dispense Refill  . atorvastatin (LIPITOR) 40 MG tablet Take 1 tablet (40 mg total) by mouth daily. 90 tablet 3  . buPROPion (WELLBUTRIN XL) 150 MG 24 hr tablet Take 450 mg by mouth daily.    Marland Kitchen ibuprofen (ADVIL,MOTRIN) 200 MG tablet Take 400 mg by mouth every 6 (six) hours as needed.    Marland Kitchen ipratropium  (ATROVENT) 0.03 % nasal spray Use 2 sprays each nostril 4 times daily as needed for head congestion 30 mL 0  . Multiple Vitamin (MULTIVITAMIN) tablet Take 1 tablet by mouth daily.      . Travoprost, BAK Free, (TRAVATAN) 0.004 % SOLN ophthalmic solution Place 1 drop into both eyes at bedtime.     No current facility-administered medications on file prior to visit.    No Known Allergies  Family History  Problem Relation Age of Onset  . Hypertension Other   . Cancer Father     colon  . Dementia Mother     BP 132/85 mmHg  Pulse 73  Temp(Src) 98.3 F (36.8 C) (Oral)  Ht 5\' 5"  (1.651 m)  Wt 160 lb (72.576 kg)  BMI 26.63 kg/m2  SpO2 97%  Review of Systems Denies dysphagia and BRBPR.  He has ED sxs.    Objective:   Physical Exam VITAL SIGNS:  See vs page GENERAL: no distress NECK: There is no palpable thyroid enlargement.  No thyroid nodule is palpable.  No palpable lymphadenopathy at the anterior neck.     Lab Results  Component Value Date   TSH 4.08 04/25/2015   Lab Results  Component Value Date  TESTOSTERONE 358.46 04/25/2015   Lab Results  Component Value Date   CHOL 138 01/15/2015   HDL 74.60 01/15/2015   LDLCALC 30 01/15/2015   LDLDIRECT 120.4 09/30/2010   TRIG 166.0* 01/15/2015   CHOLHDL 2 01/15/2015      Assessment & Plan:  GERD: therapy limited by noncompliance.  i'll do the best i can.  No warning sxs ED, new Thyroiditis: now euthyroid on no medication.  We'll follow.   Dyslipidemia: well-controlled.  same medication.  Patient is advised the following: Patient Instructions  blood tests are requested for you today.  We'll let you know about the results. i have sent 2 prescriptions to your pharmacy: viagra and a refill of the heartburn pill.   Please come back for a follow-up appointment in 6 months.

## 2015-05-07 ENCOUNTER — Ambulatory Visit (AMBULATORY_SURGERY_CENTER): Payer: Managed Care, Other (non HMO)

## 2015-05-07 VITALS — Ht 65.0 in | Wt 156.0 lb

## 2015-05-07 DIAGNOSIS — Z1211 Encounter for screening for malignant neoplasm of colon: Secondary | ICD-10-CM

## 2015-05-07 NOTE — Progress Notes (Signed)
No anesthesia complications Pt not taking diet drugs No egg or soy allergies Not on home 02 emmi video sent to [email or web address]

## 2015-05-21 ENCOUNTER — Encounter: Payer: Managed Care, Other (non HMO) | Admitting: Internal Medicine

## 2015-05-23 ENCOUNTER — Encounter: Payer: Managed Care, Other (non HMO) | Admitting: Internal Medicine

## 2015-05-28 ENCOUNTER — Encounter: Payer: Self-pay | Admitting: Internal Medicine

## 2015-06-25 ENCOUNTER — Ambulatory Visit: Payer: Managed Care, Other (non HMO) | Admitting: Neurology

## 2015-07-04 ENCOUNTER — Encounter: Payer: Managed Care, Other (non HMO) | Admitting: Internal Medicine

## 2015-07-05 ENCOUNTER — Ambulatory Visit (HOSPITAL_COMMUNITY): Admission: RE | Admit: 2015-07-05 | Payer: Managed Care, Other (non HMO) | Source: Ambulatory Visit | Admitting: Surgery

## 2015-07-05 ENCOUNTER — Encounter (HOSPITAL_COMMUNITY): Admission: RE | Payer: Self-pay | Source: Ambulatory Visit

## 2015-07-05 SURGERY — TRANSANAL HEMORRHOIDAL DEARTERIALIZATION
Anesthesia: General

## 2015-07-09 ENCOUNTER — Ambulatory Visit (INDEPENDENT_AMBULATORY_CARE_PROVIDER_SITE_OTHER): Payer: Managed Care, Other (non HMO) | Admitting: Internal Medicine

## 2015-07-09 VITALS — BP 130/90 | HR 69 | Temp 97.9°F | Resp 18 | Ht 65.0 in | Wt 151.2 lb

## 2015-07-09 DIAGNOSIS — M7522 Bicipital tendinitis, left shoulder: Secondary | ICD-10-CM

## 2015-07-09 DIAGNOSIS — R072 Precordial pain: Secondary | ICD-10-CM | POA: Diagnosis not present

## 2015-07-09 DIAGNOSIS — M940 Chondrocostal junction syndrome [Tietze]: Secondary | ICD-10-CM

## 2015-07-09 MED ORDER — MELOXICAM 15 MG PO TABS: 15.0000 mg | ORAL_TABLET | Freq: Every day | ORAL | Status: DC

## 2015-07-09 NOTE — Progress Notes (Signed)
   Subjective:    Patient ID: [name], male    DOB: [birth year], 54 y.o.   MRN: [id number] This chart was scribed for Tami Lin, MD by Marti Sleigh, Medical Scribe. This patient was seen in Room 5 and the patient's care was started a 8:08 PM.  Chief Complaint  Patient presents with  . Chest Pain    x 3-4 days (happened last week also & last about 2 days)  . Arm Pain    left arm pain since yesterday    HPI HPI Comments: [name] is a 54 y.o. male who presents to [facility] complaining of intermittent chest pain for the last 10 days. He states it went away four days ago, and came back two days ago. He states the pain started radiating to his left arm yesterday. He denies SOB, heart racing, trouble catching his breath, or diaphoresis. No prior cardiac hx. No Htn. 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 HYPERCHOLESTEROLEMIA 03/11/2010  On lipitor-Dr Loanne Drilling  Review of Systems  Constitutional: Negative for fever, chills, diaphoresis, appetite change, fatigue and unexpected weight change.  Respiratory: Positive for chest tightness. Negative for cough, shortness of breath and wheezing.   Cardiovascular: Positive for chest pain. Negative for palpitations and leg swelling.  Gastrointestinal: Negative for abdominal pain.  Neurological: Negative for dizziness and headaches.       Objective:   Physical Exam  Constitutional: He is oriented to person, place, and time. He appears well-developed and well-nourished. No distress.  HENT:  Head: Normocephalic and atraumatic.  Eyes: EOM are normal. Pupils are equal, round, and reactive to light.  Neck: Normal range of motion. Neck supple. No thyromegaly present.  Cardiovascular: Normal rate, regular rhythm, normal heart sounds and intact distal pulses.  Exam reveals no friction rub.   No murmur heard. Pulmonary/Chest: Effort normal and breath sounds normal.  No respiratory distress.  Tender C-S junctions bilaterally 2 and 3.   Abdominal: Soft. There is no tenderness.  Musculoskeletal: Normal range of motion. He exhibits no edema.  Tender biceps tendon with pain on shoulder ROM, especially abduction greater than 90 degrees.   Lymphadenopathy:    He has no cervical adenopathy.  Neurological: He is alert and oriented to person, place, and time. No cranial nerve deficit.  Skin: Skin is warm and dry. He is not diaphoretic.  Psychiatric: He has a normal mood and affect. His behavior is normal.  Nursing note and vitals reviewed.   Filed Vitals:   07/09/15 1944  BP: 130/90  Pulse: 69  Temp: 97.9 F (36.6 C)  TempSrc: Oral  Resp: 18  Height: 5\' 5"  (1.651 m)  Weight: 151 lb 4 oz (68.607 kg)  SpO2: 99%   EKG=WNL    Assessment & Plan:   I have completed the patient encounter in its entirety as documented by the scribe, with editing by me where necessary. Vittorio Mohs P. Laney Pastor, M.D.  Precordial pain - Plan: EKG 12-Lead  Costochondritis  Bicipital tendinitis of left shoulder  Meds ordered this encounter  Medications  . meloxicam (MOBIC) 15 MG tablet    Sig: Take 1 tablet (15 mg total) by mouth daily.    Dispense:  30 tablet    Refill:  0   Stretching exercises before work Reassured re cardiac ststus

## 2015-07-25 ENCOUNTER — Ambulatory Visit: Payer: Managed Care, Other (non HMO)

## 2015-07-30 ENCOUNTER — Ambulatory Visit (INDEPENDENT_AMBULATORY_CARE_PROVIDER_SITE_OTHER): Payer: Managed Care, Other (non HMO)

## 2015-07-30 ENCOUNTER — Encounter: Payer: Managed Care, Other (non HMO) | Admitting: Internal Medicine

## 2015-07-30 DIAGNOSIS — Z23 Encounter for immunization: Secondary | ICD-10-CM | POA: Diagnosis not present

## 2015-08-06 ENCOUNTER — Ambulatory Visit (AMBULATORY_SURGERY_CENTER): Payer: Self-pay

## 2015-08-06 VITALS — Ht 65.0 in | Wt 155.0 lb

## 2015-08-06 DIAGNOSIS — Z1211 Encounter for screening for malignant neoplasm of colon: Secondary | ICD-10-CM

## 2015-08-06 MED ORDER — NA SULFATE-K SULFATE-MG SULF 17.5-3.13-1.6 GM/177ML PO SOLN: 1.0000 | Freq: Once | ORAL | Status: DC

## 2015-08-06 NOTE — Progress Notes (Signed)
No egg or soy allergies Not on home 02 No previous anesthesia complications Emmi video emailed to [email or web address] No diet or weight loss meds

## 2015-08-07 ENCOUNTER — Other Ambulatory Visit: Payer: Self-pay | Admitting: Endocrinology

## 2015-08-13 ENCOUNTER — Encounter: Payer: Self-pay | Admitting: Neurology

## 2015-08-13 ENCOUNTER — Ambulatory Visit (INDEPENDENT_AMBULATORY_CARE_PROVIDER_SITE_OTHER): Payer: Managed Care, Other (non HMO) | Admitting: Neurology

## 2015-08-13 ENCOUNTER — Other Ambulatory Visit (INDEPENDENT_AMBULATORY_CARE_PROVIDER_SITE_OTHER): Payer: Managed Care, Other (non HMO)

## 2015-08-13 VITALS — BP 118/78 | HR 84 | Ht 64.25 in | Wt 154.0 lb

## 2015-08-13 DIAGNOSIS — F32A Depression, unspecified: Secondary | ICD-10-CM

## 2015-08-13 DIAGNOSIS — F329 Major depressive disorder, single episode, unspecified: Secondary | ICD-10-CM | POA: Diagnosis not present

## 2015-08-13 DIAGNOSIS — R413 Other amnesia: Secondary | ICD-10-CM

## 2015-08-13 LAB — VITAMIN B12: VITAMIN B 12: 1069 pg/mL — AB (ref 211–911)

## 2015-08-13 NOTE — Patient Instructions (Addendum)
1. Schedule MRI brain without contrast 2. Bloodwork for B12, RPR 3. Our office will call you with results, if normal, discuss further treatment of depression 4. Call our office for any changes in symptoms

## 2015-08-13 NOTE — Progress Notes (Signed)
NEUROLOGY CONSULTATION NOTE  [name] MRN: [id number] DOB: 1961-06-10  Referring provider: Dr. Chucky May Primary care provider: Dr. Renato Shin  Reason for consult:  Memory loss  Dear Dr Toy Care:  Thank you for your kind referral of [name] for consultation of the above symptoms. Although his history is well known to you, please allow me to reiterate it for the purpose of our medical record.Records and images were personally reviewed where available.  HISTORY OF PRESENT ILLNESS: This is a pleasant 54 year old right-handed man with a history of depression, anxiety, thyroiditis, presenting for evaluation of memory loss. He reports noticing memory changes more than 5 years ago, forgetting people's names, however recently symptoms have worsened that he could not recall his phone number or social security number. Last year he forgot to pay his power bill and electricity was disconnected. He lives alone and sometimes cannot recall what he went to a room to get. 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 He denies missing any medication, he does not get lost driving. His mother and maternal grandmother had Alzheimer's disease. He denies any head injuries except for a fall at age 9 when he hit the right occipital region and sustained a scar. No alcohol intake.  He has frequent headaches that started around April or so. Pain is over the bitemporal regions, with pressure-like sensation, mostly when he is reading or writing something. Pain occurs on a near-daily basis, lasting several hours. He takes some leftover hydrocodone or Motrin which helps. There is no associated nausea, vomiting, photo/phonophobia. He has occasional dizziness when standing. No diplopia, dysarthria, dysphagia, focal numbness/tingling/weakness. He has occasional neck and back pain, occasional constipation. No anosmia or tremors. He reports feeling depressed everyday and feels the  Wellbutrin is not helping.   Laboratory Data: Lab Results  Component Value Date   WBC 6.0 01/15/2015   HGB 14.7 01/15/2015   HCT 43.2 01/15/2015   MCV 83.4 01/15/2015   PLT 269.0 01/15/2015     Chemistry      Component Value Date/Time   NA 137 01/15/2015 1702   K 3.7 01/15/2015 1702   CL 105 01/15/2015 1702   CO2 28 01/15/2015 1702   BUN 12 01/15/2015 1702   CREATININE 1.06 01/15/2015 1702   CREATININE 0.98 01/09/2014 1655      Component Value Date/Time   CALCIUM 9.2 01/15/2015 1702   ALKPHOS 94 01/15/2015 1702   AST 43* 01/15/2015 1702   ALT 56* 01/15/2015 1702   BILITOT 0.7 01/15/2015 1702     Lab Results  Component Value Date   TSH 4.08 04/25/2015     PAST MEDICAL HISTORY: Past Medical History  Diagnosis Date  . ARM PAIN, LEFT 11/20/2009  . BIPOLAR DISORDER UNSPECIFIED 12/13/2007    patient denies  . CHEST PAIN 03/23/2009  . DEPRESSION 12/13/2007  . FATIGUE 03/23/2009  . GLAUCOMA 09/03/2009  . HEMORRHOIDS 09/03/2009  . HOARSENESS 09/03/2009  . HYPERCHOLESTEROLEMIA 03/11/2010  . MYALGIA 08/13/2009  . NECK PAIN 01/18/2009  . Palpitations 03/23/2009  . Polydipsia 01/24/2008  . Shortness of breath 03/23/2009  . THYROID NODULE 08/28/2008    benign on Bx  . THYROIDITIS 12/13/2007  . GERD (gastroesophageal reflux disease)   . Arthritis   . Anxiety     PAST SURGICAL HISTORY: Past Surgical History  Procedure Laterality Date  . Back surgery      x 3, lumbar  . Finger surgery    .  Eye surgery      2008    MEDICATIONS: Current Outpatient Prescriptions on File Prior to Visit  Medication Sig Dispense Refill  . atorvastatin (LIPITOR) 40 MG tablet TAKE 1 TABLET (40 MG TOTAL) BY MOUTH DAILY. 30 tablet 5  . buPROPion (WELLBUTRIN XL) 150 MG 24 hr tablet 150 mg. Take 3 tablets daily    . meloxicam (MOBIC) 15 MG tablet Take 1 tablet (15 mg total) by mouth daily. (Patient taking differently: Take 15 mg by mouth as needed. ) 30 tablet 0  . mirtazapine (REMERON) 30 MG tablet  Take 30 mg by mouth at bedtime.     . Multiple Vitamin (MULTIVITAMIN) tablet Take 1 tablet by mouth daily.      Marland Kitchen omeprazole (PRILOSEC) 40 MG capsule Take 1 capsule (40 mg total) by mouth daily. 30 capsule 11  . sildenafil (REVATIO) 20 MG tablet 1-5 pills as needed for ED symptoms 50 tablet 10  . Travoprost, BAK Free, (TRAVATAN) 0.004 % SOLN ophthalmic solution Place 1 drop into both eyes at bedtime.    Marland Kitchen ipratropium (ATROVENT) 0.03 % nasal spray Use 2 sprays each nostril 4 times daily as needed for head congestion (Patient not taking: Reported on 08/13/2015) 30 mL 0   No current facility-administered medications on file prior to visit.    ALLERGIES: No Known Allergies  FAMILY HISTORY: Family History  Problem Relation Age of Onset  . Hypertension Other   . Cancer Father 62    colon  . Colon cancer Father   . Dementia Mother     SOCIAL HISTORY: Social History   Social History  . Marital Status: Divorced    Spouse Name: N/A  . Number of Children: 1  . Years of Education: N/A   Occupational History  . Bagger    Social History Main Topics  . Smoking status: Never Smoker   . Smokeless tobacco: Never Used  . Alcohol Use: No  . Drug Use: No  . Sexual Activity: Not on file   Other Topics Concern  . Not on file   Social History Narrative   Mr Heikes lives with his wife in [place], 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       He is originally form [place]    REVIEW OF SYSTEMS: Constitutional: No fevers, chills, or sweats, no generalized fatigue, change in appetite Eyes: No visual changes, double vision, eye pain Ear, nose and throat: No hearing loss, ear pain, nasal congestion, sore throat Cardiovascular: No chest pain, palpitations Respiratory:  No shortness of breath at rest or with exertion, wheezes GastrointestinaI: No nausea, vomiting, diarrhea, abdominal pain, fecal incontinence Genitourinary:  No dysuria, urinary retention or  frequency Musculoskeletal:  + neck pain, back pain Integumentary: No rash, pruritus, skin lesions Neurological: as above Psychiatric: + depression, insomnia, anxiety Endocrine: No palpitations, fatigue, diaphoresis, mood swings, change in appetite, change in weight, increased thirst Hematologic/Lymphatic:  No anemia, purpura, petechiae. Allergic/Immunologic: no itchy/runny eyes, nasal congestion, recent allergic reactions, rashes  PHYSICAL EXAM: Filed Vitals:   08/13/15 1349  BP: 118/78  Pulse: 84   General: No acute distress Head:  Normocephalic/atraumatic Eyes: Fundoscopic exam shows bilateral sharp discs, no vessel changes, exudates, or hemorrhages Neck: supple, no paraspinal tenderness, full range of motion Back: No paraspinal tenderness Heart: regular rate and rhythm Lungs: Clear to auscultation bilaterally. Vascular: No carotid bruits. Skin/Extremities: No rash, no edema Neurological Exam: Mental status: alert and oriented to person, place, and time, no dysarthria  or aphasia, Fund of knowledge is appropriate.  Recent and remote memory are intact.  Attention and concentration are normal.    Able to name objects and repeat phrases.  MMSE - Mini Mental State Exam 08/13/2015  Orientation to time 5  Orientation to Place 5  Registration 3  Attention/ Calculation 5  Recall 3  Language- name 2 objects 2  Language- repeat 1  Language- follow 3 step command 3  Language- read & follow direction 1  Write a sentence 1  Copy design 1  Total score 30   Cranial nerves: CN I: not tested CN II: pupils equal, round and reactive to light, visual fields intact, fundi unremarkable. CN III, IV, VI:  full range of motion, no nystagmus, no ptosis CN V: facial sensation intact CN VII: upper and lower face symmetric CN VIII: hearing intact to finger rub CN IX, X: gag intact, uvula midline CN XI: sternocleidomastoid and trapezius muscles intact CN XII: tongue midline Bulk & Tone: normal,  no fasciculations. Motor: 5/5 throughout with no pronator drift. Sensation: intact to light touch, cold, pin, vibration and joint position sense.  No extinction to double simultaneous stimulation.  Romberg test negative Deep Tendon Reflexes: +2 throughout, no ankle clonus Plantar responses: downgoing bilaterally Cerebellar: no incoordination on finger to nose, heel to shin. No dysdiadochokinesia Gait: narrow-based and steady, able to tandem walk adequately. Tremor: none  IMPRESSION: This is a 54 year old right-handed man with a history of depression, thyroiditis, presenting for worsening memory loss. His MMSE today is normal 30/30, neurological exam normal. We discussed different causes of memory loss. Check B12, RPR. MRI brain without contrast will be ordered to assess for underlying structural abnormality. We pseudodementia and effects of mood on memory, he feels that after our visit, his symptoms most likely are due to underlying depression. We discussed that if test results come back normal, he will discuss further treatment of depression with Dr. Toy Care. If memory symptoms continue despite adequate treatment of depression, consideration for Neuropsychological evaluation can be done. We discussed the importance physical exercise, and brain stimulation exercises for brain health. If MRI and bloodwork normal, he will follow-up on an as needed basis and knows to call our office for any changes.    Thank you for allowing me to participate in the care of this patient. Please do not hesitate to call for any questions or concerns.   Ellouise Newer, M.D.  CC: Dr. Loanne Drilling, Dr. Toy Care

## 2015-08-14 ENCOUNTER — Telehealth: Payer: Self-pay | Admitting: Family Medicine

## 2015-08-14 LAB — RPR

## 2015-08-14 NOTE — Telephone Encounter (Signed)
Pt called to get lab results/ call back @ [phone]//work # [phone] option 3 then option 4 / pt request

## 2015-08-14 NOTE — Telephone Encounter (Signed)
Lmovm to rtn my call. 

## 2015-08-14 NOTE — Telephone Encounter (Signed)
-----   Message from Cameron Sprang, MD sent at 08/14/2015  8:30 AM EDT ----- Pls let him know bloodwork is normal, thanks

## 2015-08-14 NOTE — Telephone Encounter (Signed)
Called patient back and notified of results.

## 2015-08-16 DIAGNOSIS — F329 Major depressive disorder, single episode, unspecified: Secondary | ICD-10-CM | POA: Insufficient documentation

## 2015-08-16 DIAGNOSIS — F32A Depression, unspecified: Secondary | ICD-10-CM | POA: Insufficient documentation

## 2015-08-20 ENCOUNTER — Encounter: Payer: Managed Care, Other (non HMO) | Admitting: Internal Medicine

## 2015-08-24 ENCOUNTER — Telehealth: Payer: Self-pay | Admitting: Family Medicine

## 2015-08-24 NOTE — Telephone Encounter (Signed)
Spoke with patient about correspondence we received from his ins co to send order for mri scan to [facility] in [place] due to cheaper cost than [facility] which is where patient was originally scheduled. Patient was agreeable and asked that his appt be scheduled for a Monday in November. I told him I would call [facility] and get him scheduled and call him back with appt information. Patient told me it was ok to leave the details on his vm because he was getting ready to go back to work. I called scheduling and cancelled his appt @ [facility] for 10/31.    Called patient and left vm that his mri brain has been scheduled @ [facility] on 09/17/15 @ 9:00 am.  Location [address] [place], [place] [address].    Ph: [phone].   Asked him to call me back if he had any further questions.

## 2015-08-27 ENCOUNTER — Ambulatory Visit (HOSPITAL_COMMUNITY): Admission: RE | Admit: 2015-08-27 | Payer: Managed Care, Other (non HMO) | Source: Ambulatory Visit

## 2015-09-03 ENCOUNTER — Telehealth: Payer: Self-pay | Admitting: Neurology

## 2015-09-03 NOTE — Telephone Encounter (Signed)
PT called in regards to when his MRI appointment is/Dawn CB# [phone]

## 2015-09-04 NOTE — Telephone Encounter (Signed)
Lmovm to rtn my call. 

## 2015-09-05 NOTE — Telephone Encounter (Signed)
Called patient again. Patient states he didn't get the previous msg I left with his appt information. He states he had been having some trouble with his phone. I did give him all MRI appt information for [facility] on Nov. 21st @ 9:00 am.

## 2015-10-08 ENCOUNTER — Ambulatory Visit (AMBULATORY_SURGERY_CENTER): Payer: Self-pay

## 2015-10-08 ENCOUNTER — Encounter: Payer: Self-pay | Admitting: Internal Medicine

## 2015-10-08 VITALS — Ht 65.0 in | Wt 157.2 lb

## 2015-10-08 DIAGNOSIS — Z800 Family history of malignant neoplasm of digestive organs: Secondary | ICD-10-CM

## 2015-10-08 NOTE — Progress Notes (Signed)
No allergies to eggs or soy or flu shot No diet/weight loss meds No home oxygen No past problems with anesthesia  Has email and internet; registered for emmi 

## 2015-10-12 ENCOUNTER — Other Ambulatory Visit: Payer: Self-pay | Admitting: Internal Medicine

## 2015-10-12 ENCOUNTER — Telehealth: Payer: Self-pay | Admitting: Internal Medicine

## 2015-10-15 ENCOUNTER — Ambulatory Visit: Payer: Managed Care, Other (non HMO) | Admitting: Endocrinology

## 2015-10-16 ENCOUNTER — Ambulatory Visit (AMBULATORY_SURGERY_CENTER): Payer: Managed Care, Other (non HMO) | Admitting: Internal Medicine

## 2015-10-16 ENCOUNTER — Encounter: Payer: Self-pay | Admitting: Internal Medicine

## 2015-10-16 VITALS — BP 123/77 | HR 67 | Temp 96.7°F | Resp 20 | Ht 65.0 in | Wt 157.0 lb

## 2015-10-16 DIAGNOSIS — Z1211 Encounter for screening for malignant neoplasm of colon: Secondary | ICD-10-CM | POA: Diagnosis present

## 2015-10-16 DIAGNOSIS — D123 Benign neoplasm of transverse colon: Secondary | ICD-10-CM

## 2015-10-16 MED ORDER — SODIUM CHLORIDE 0.9 % IV SOLN: 500.0000 mL | INTRAVENOUS | Status: DC

## 2015-10-16 NOTE — Progress Notes (Signed)
Called to room to assist during endoscopic procedure.  Patient ID and intended procedure confirmed with present staff. Received instructions for my participation in the procedure from the performing physician.  

## 2015-10-16 NOTE — Op Note (Signed)
[facility]  [address] [place], [address]   COLONOSCOPY PROCEDURE REPORT  PATIENT: [name], [name]  MR#: [id number] BIRTHDATE: [birth year] , 54  yrs. old GENDER: male ENDOSCOPIST: Jerene Bears, MD REFERRED [id number] Rupert Stacks, M.D. PROCEDURE DATE:  10/16/2015 PROCEDURE:   Colonoscopy, screening and Colonoscopy with snare polypectomy First Screening Colonoscopy - Avg.  risk and is 50 yrs.  old or older Yes.  Prior Negative Screening - Now for repeat screening. N/A  History of Adenoma - Now for follow-up colonoscopy & has been > or = to 3 yrs.  N/A  Polyps removed today? Yes ASA CLASS:   Class II INDICATIONS:Screening for colonic neoplasia, Colorectal Neoplasm Risk Assessment for this procedure is average risk, and 1st colonoscopy. MEDICATIONS: Monitored anesthesia care and Propofol 180 mg IV  DESCRIPTION OF PROCEDURE:   After the risks benefits and alternatives of the procedure were thoroughly explained, informed consent was obtained.  The digital rectal exam revealed no rectal mass.   The LB [id number]  endoscope was introduced through the anus and advanced to the cecum, which was identified by both the appendix and ileocecal valve. No adverse events experienced. The quality of the prep was good.  (Suprep was used)  The instrument was then slowly withdrawn as the colon was fully examined. Estimated blood loss is zero unless otherwise noted in this procedure report.  COLON FINDINGS: A sessile polyp measuring 5 mm in size was found in the transverse colon.  A polypectomy was performed with a cold snare.  The resection was complete, the polyp tissue was completely retrieved and sent to histology.   The examination was otherwise normal.  Retroflexed views revealed internal and external hemorrhoids. The time to cecum = 3.9 Withdrawal time = 9.7   The scope was withdrawn and the procedure completed. COMPLICATIONS: There were no immediate  complications.  ENDOSCOPIC IMPRESSION: 1.   Sessile polyp was found in the transverse colon; polypectomy was performed with a cold snare 2.   The examination was otherwise normal  RECOMMENDATIONS: 1.  Await pathology results 2.  If the polyp removed today is proven to be an adenomatous (pre-cancerous) polyp, you will need a repeat colonoscopy in 5 years.  Otherwise you should continue to follow colorectal cancer screening guidelines for "routine risk" patients with colonoscopy in 10 years.  You will receive a letter within 1-2 weeks with the results of your biopsy as well as final recommendations.  Please call my office if you have not received a letter after 3 weeks.  eSigned:  Jerene Bears, MD 10/16/2015 8:29 AM   cc: the patient, Dr. Loanne Drilling, Dr. Johney Maine

## 2015-10-16 NOTE — Patient Instructions (Signed)
Discharge instructions given. Handouts on polyps and hemorrhoids. Resume previous medications. YOU HAD AN ENDOSCOPIC PROCEDURE TODAY AT THE [facility]:   Refer to the procedure report that was given to you for any specific questions about what was found during the examination.  If the procedure report does not answer your questions, please call your gastroenterologist to clarify.  If you requested that your care partner not be given the details of your procedure findings, then the procedure report has been included in a sealed envelope for you to review at your convenience later.  YOU SHOULD EXPECT: Some feelings of bloating in the abdomen. Passage of more gas than usual.  Walking can help get rid of the air that was put into your GI tract during the procedure and reduce the bloating. If you had a lower endoscopy (such as a colonoscopy or flexible sigmoidoscopy) you may notice spotting of blood in your stool or on the toilet paper. If you underwent a bowel prep for your procedure, you may not have a normal bowel movement for a few days.  Please Note:  You might notice some irritation and congestion in your nose or some drainage.  This is from the oxygen used during your procedure.  There is no need for concern and it should clear up in a day or so.  SYMPTOMS TO REPORT IMMEDIATELY:   Following lower endoscopy (colonoscopy or flexible sigmoidoscopy):  Excessive amounts of blood in the stool  Significant tenderness or worsening of abdominal pains  Swelling of the abdomen that is new, acute  Fever of 100F or higher   For urgent or emergent issues, a gastroenterologist can be reached at any hour by calling [phone].   DIET: Your first meal following the procedure should be a small meal and then it is ok to progress to your normal diet. Heavy or fried foods are harder to digest and may make you feel nauseous or bloated.  Likewise, meals heavy in dairy and vegetables can increase  bloating.  Drink plenty of fluids but you should avoid alcoholic beverages for 24 hours.  ACTIVITY:  You should plan to take it easy for the rest of today and you should NOT DRIVE or use heavy machinery until tomorrow (because of the sedation medicines used during the test).    FOLLOW UP: Our staff will call the number listed on your records the next business day following your procedure to check on you and address any questions or concerns that you may have regarding the information given to you following your procedure. If we do not reach you, we will leave a message.  However, if you are feeling well and you are not experiencing any problems, there is no need to return our call.  We will assume that you have returned to your regular daily activities without incident.  If any biopsies were taken you will be contacted by phone or by letter within the next 1-3 weeks.  Please call us at [phone] if you have not heard about the biopsies in 3 weeks.    SIGNATURES/CONFIDENTIALITY: You and/or your care partner have signed paperwork which will be entered into your electronic medical record.  These signatures attest to the fact that that the information above on your After Visit Summary has been reviewed and is understood.  Full responsibility of the confidentiality of this discharge information lies with you and/or your care-partner. 

## 2015-10-16 NOTE — Progress Notes (Signed)
A/ox3 pleased with MAC, report to Celia RN 

## 2015-10-17 ENCOUNTER — Telehealth: Payer: Self-pay | Admitting: *Deleted

## 2015-10-17 NOTE — Telephone Encounter (Signed)
No answer, left message to call if questions or concerns. 

## 2015-10-24 ENCOUNTER — Encounter: Payer: Self-pay | Admitting: Internal Medicine

## 2015-11-12 ENCOUNTER — Ambulatory Visit: Payer: Managed Care, Other (non HMO) | Admitting: Endocrinology

## 2015-12-03 ENCOUNTER — Encounter: Payer: Self-pay | Admitting: Endocrinology

## 2015-12-03 ENCOUNTER — Ambulatory Visit (INDEPENDENT_AMBULATORY_CARE_PROVIDER_SITE_OTHER): Payer: Managed Care, Other (non HMO) | Admitting: Endocrinology

## 2015-12-03 VITALS — BP 122/80 | HR 80 | Temp 98.6°F | Ht 64.5 in | Wt 154.0 lb

## 2015-12-03 DIAGNOSIS — E7800 Pure hypercholesterolemia, unspecified: Secondary | ICD-10-CM | POA: Diagnosis not present

## 2015-12-03 DIAGNOSIS — E069 Thyroiditis, unspecified: Secondary | ICD-10-CM | POA: Diagnosis not present

## 2015-12-03 MED ORDER — ATORVASTATIN CALCIUM 40 MG PO TABS: ORAL_TABLET | ORAL | Status: DC

## 2015-12-03 MED ORDER — SILDENAFIL CITRATE 20 MG PO TABS: ORAL_TABLET | ORAL | Status: DC

## 2015-12-03 NOTE — Progress Notes (Signed)
Subjective:    Patient ID: [name], male    DOB: 02/13/1961, 55 y.o.   MRN: [id number]  HPI ED: pt says well-controlled, on revatio. Denies chest pain.  He ran out of lipitor. Pt says depression is well-controlled.  Past Medical History  Diagnosis Date  . ARM PAIN, LEFT 11/20/2009  . BIPOLAR DISORDER UNSPECIFIED 12/13/2007    patient denies  . CHEST PAIN 03/23/2009  . DEPRESSION 12/13/2007  . FATIGUE 03/23/2009  . GLAUCOMA 09/03/2009  . HEMORRHOIDS 09/03/2009  . HOARSENESS 09/03/2009  . HYPERCHOLESTEROLEMIA 03/11/2010  . MYALGIA 08/13/2009  . NECK PAIN 01/18/2009  . Palpitations 03/23/2009  . Polydipsia 01/24/2008  . Shortness of breath 03/23/2009  . THYROID NODULE 08/28/2008    benign on Bx  . THYROIDITIS 12/13/2007  . GERD (gastroesophageal reflux disease)   . Arthritis   . Anxiety   . Blood transfusion without reported diagnosis     Past Surgical History  Procedure Laterality Date  . Back surgery      x 3, lumbar  . Finger surgery    . Eye surgery      2008    Social History   Social History  . Marital Status: Divorced    Spouse Name: N/A  . Number of Children: 1  . Years of Education: N/A   Occupational History  . Bagger    Social History Main Topics  . Smoking status: Never Smoker   . Smokeless tobacco: Never Used  . Alcohol Use: No  . Drug Use: No  . Sexual Activity: Not on file   Other Topics Concern  . Not on file   Social History Narrative   [name] lives with his wife in [place], 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       He is originally form [place]    Current Outpatient Prescriptions on File Prior to Visit  Medication Sig Dispense Refill  . buPROPion (WELLBUTRIN XL) 150 MG 24 hr tablet 150 mg. Take 3 tablets daily    . ipratropium (ATROVENT) 0.03 % nasal spray Use 2 sprays each nostril 4 times daily as needed for head congestion 30 mL 0  . meloxicam (MOBIC) 15 MG tablet Take 1 tablet (15 mg total) by mouth daily.  (Patient taking differently: Take 15 mg by mouth as needed. ) 30 tablet 0  . mirtazapine (REMERON) 30 MG tablet Take 30 mg by mouth at bedtime.     . Multiple Vitamin (MULTIVITAMIN) tablet Take 1 tablet by mouth daily.      Marland Kitchen omeprazole (PRILOSEC) 40 MG capsule Take 1 capsule (40 mg total) by mouth daily. 30 capsule 11  . Travoprost, BAK Free, (TRAVATAN) 0.004 % SOLN ophthalmic solution Place 1 drop into both eyes at bedtime.     No current facility-administered medications on file prior to visit.    No Known Allergies  Family History  Problem Relation Age of Onset  . Hypertension Other   . Cancer Father 28    colon  . Colon cancer Father   . Dementia Mother     BP 122/80 mmHg  Pulse 80  Temp(Src) 98.6 F (37 C) (Oral)  Ht 5' 4.5" (1.638 m)  Wt 154 lb (69.854 kg)  BMI 26.04 kg/m2  SpO2 98%  Review of Systems Denies weight change.    Objective:   Physical Exam VITAL SIGNS:  See vs page. GENERAL: no distress. NECK: There is no palpable thyroid enlargement.  No thyroid nodule  is palpable.  No palpable lymphadenopathy at the anterior neck.        Assessment & Plan:  ED: well-controlled: Please continue the same medications Dyslipidemia: therapy limited by noncompliance.   Depression: well-controlled.  Please continue the same medication.   Patient is advised the following: Patient Instructions  A thyroid blood test is requested for you today.  We'll let you know about the results.  i have sent a prescription to your pharmacy, to refill the lipitor.   i have sent a prescription to [facility] drug, for the generic type of viagra.   Please come back for a regular physical appointment in 3 months.

## 2015-12-03 NOTE — Patient Instructions (Addendum)
A thyroid blood test is requested for you today.  We'll let you know about the results.  i have sent a prescription to your pharmacy, to refill the lipitor.   i have sent a prescription to [facility] drug, for the generic type of viagra.   Please come back for a regular physical appointment in 3 months.

## 2015-12-04 LAB — LIPID PANEL
CHOLESTEROL: 226 mg/dL — AB (ref 0–200)
HDL: 79.1 mg/dL (ref >39.00)
Total CHOL/HDL Ratio: 3

## 2015-12-04 LAB — TSH: TSH: 2.84 u[IU]/mL (ref 0.35–4.50)

## 2015-12-04 LAB — LDL CHOLESTEROL, DIRECT: Direct LDL: 98 mg/dL

## 2015-12-05 ENCOUNTER — Other Ambulatory Visit: Payer: Self-pay | Admitting: Endocrinology

## 2015-12-05 DIAGNOSIS — E7800 Pure hypercholesterolemia, unspecified: Secondary | ICD-10-CM

## 2016-01-21 ENCOUNTER — Ambulatory Visit: Payer: Managed Care, Other (non HMO) | Admitting: Endocrinology

## 2016-02-25 ENCOUNTER — Ambulatory Visit: Payer: Self-pay | Admitting: Surgery

## 2016-02-25 NOTE — H&P (Signed)
[name] 02/25/2016 1:31 PM Location: [facility] Patient #: [id number] DOB: 03-07-1961 Single / Language: [name] / Race: White Male  History of Present Illness Stephen Hector MD; 02/25/2016 1:39 PM) The patient is a 55 year old male who presents with anal pain. Note for "Anal pain": The patient returns yet again to reconsider surgery yet again for his hemorrhoids.  Pleasant patient initially from [employer]. He has struggled with hemorrhoids. I had diagnosed hemorrhoids years ago and did banding. 3 months later he seemed to have a fissure. I recommended diltiazem gel. I recommended surgery if it did not improve. We called him several times with no answer. Patient tells me he never heard back from [place]. He do not fill diltiazem Rx. He did not call us back. Lost to follow-up. He returned last year. No definite fissure potentially very sensitive hemorrhoids. I offered surgery at this point has he had more chronically prolapsing hemorrhoids that were very sensitive and would not be amenable more successfully treated with anoscopic banding or injection. He did not schedule surgery.  He returns yet again. He still struggles with intermittent bleeding and pain. Occasionally feels something pop out. Usually reduces back and. Claims to have a bowel movement about every day or so. Not on any fiber supplement. Was supposed to get screening colonoscopy last year but postponed since he could not find a ride home. Finally got it done in December 2016. Tubular adenoma removed. No other  Patient notes he gets discomfort burning and bleeding with bowel movements. Occasionally sharp but not severe. Then gets asymptomatic for a month or so and then comes back. Because of persistent and worsening symptoms, he wished to be reevaluated. History of numerous lumbar and cervical spine issues requiring surgery. Has used narcotics in the past. I do not think he's on anything  currently. 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   Problem List/Past Medical Stephen Hector, MD; 02/25/2016 1:34 PM) COMPLEX THIRD DEGREE HEMORRHOID [phone])  Other Problems Stephen Hector, MD; 02/25/2016 1:34 PM) Anxiety Disorder Arthritis Back Pain Depression Gastroesophageal Reflux Disease Hemorrhoids Hypercholesterolemia Thyroid Disease  Past Surgical History Stephen Hector, MD; 02/25/2016 1:34 PM) Spinal Surgery - Lower Back  Allergies [name], CMA; 02/25/2016 1:31 PM) No Known Drug Allergies 02/05/2015  Medication History [name], CMA; 02/25/2016 1:31 PM) Lipitor (40MG  Tablet, Oral) Active. Wellbutrin SR (150MG  Tablet ER 12HR, Oral) Active. Atrovent (0.03% Solution, Nasal) Active. Multivitamins (Oral) Active. Omeprazole (40MG  Capsule DR, Oral) Active. Travatan Z (0.004% Solution, Ophthalmic) Active. Medications Reconciled  Social History Stephen Hector, MD; 02/25/2016 1:34 PM) Caffeine use Coffee, Tea. No alcohol use No drug use Tobacco use Never smoker.  Family History Stephen Hector, MD; 02/25/2016 1:34 PM) Colon Cancer Father. Depression Sister.     Review of Systems Stephen Hector, MD; 02/25/2016 1:34 PM) General Present- Fatigue. Not Present- Appetite Loss, Chills, Fever, Night Sweats, Weight Gain and Weight Loss. Skin Not Present- Change in Wart/Mole, Dryness, Hives, Jaundice, New Lesions, Non-Healing Wounds, Rash and Ulcer. HEENT Present- Seasonal Allergies. Not Present- Earache, Hearing Loss, Hoarseness, Nose Bleed, Oral Ulcers, Ringing in the Ears, Sinus Pain, Sore Throat, Visual Disturbances, Wears glasses/contact lenses and Yellow Eyes. Respiratory Present- Snoring. Not Present- Bloody sputum, Chronic Cough, Difficulty Breathing and Wheezing. Cardiovascular Not Present- Chest Pain, Difficulty Breathing Lying Down, Leg Cramps, Palpitations, Rapid Heart Rate, Shortness of Breath and Swelling of  Extremities. Gastrointestinal Present- Change in Bowel Habits,  Excessive gas and Hemorrhoids. Not Present- Abdominal Pain, Bloating, Bloody Stool, Chronic diarrhea, Constipation, Difficulty Swallowing, Gets full quickly at meals, Indigestion, Nausea, Rectal Pain and Vomiting. Male Genitourinary Not Present- Blood in Urine, Change in Urinary Stream, Frequency, Impotence, Nocturia, Painful Urination, Urgency and Urine Leakage.  Vitals [name] CMA; 02/25/2016 1:32 PM) 02/25/2016 1:32 PM Weight: 157 lb Height: 65in Body Surface Area: 1.78 m Body Mass Index: 26.13 kg/m  Temp.: 97.75F  Pulse: 85 (Regular)  BP: 128/84 (Sitting, Left Arm, Standard)      Physical Exam Stephen Hector MD; 02/25/2016 1:43 PM)  General Mental Status-Alert. General Appearance-Not in acute distress, Not Sickly. Orientation-Oriented X3. Hydration-Well hydrated. Voice-Normal.  Integumentary Global Assessment Normal Exam - Axillae: non-tender, no inflammation or ulceration, no drainage. and Distribution of scalp and body hair is normal. General Characteristics Temperature - normal warmth is noted.  Head and Neck Head-normocephalic, atraumatic with no lesions or palpable masses. Face Global Assessment - atraumatic, no absence of expression. Neck Global Assessment - no abnormal movements, no bruit auscultated on the right, no bruit auscultated on the left, no decreased range of motion, non-tender. Trachea-midline. Thyroid Gland Characteristics - non-tender.  Eye Eyeball - Left-Extraocular movements intact, No Nystagmus. Eyeball - Right-Extraocular movements intact, No Nystagmus. Cornea - Left-No Hazy. Cornea - Right-No Hazy. Sclera/Conjunctiva - Left-No scleral icterus, No Discharge. Sclera/Conjunctiva - Right-No scleral icterus, No Discharge. Pupil - Left-Direct reaction to light normal. Pupil - Right-Direct reaction to light normal.  ENMT Ears Pinna -  Left - no drainage observed, no generalized tenderness observed. Right - no drainage observed, no generalized tenderness observed. Nose and Sinuses Nose - no destructive lesion observed. Nares - Left - quiet respiration. Right - quiet respiration. Mouth and Throat Lips - Upper Lip - no fissures observed, no pallor noted. Lower Lip - no fissures observed, no pallor noted. Nasopharynx - no discharge present. Oral Cavity/Oropharynx - Tongue - no dryness observed. Oral Mucosa - no cyanosis observed. Hypopharynx - no evidence of airway distress observed.  Chest and Lung Exam Inspection Movements - Normal and Symmetrical. Accessory muscles - No use of accessory muscles in breathing. Palpation Normal exam - Non-tender. Auscultation Breath sounds - Normal and Clear.  Cardiovascular Auscultation Rhythm - Regular. Murmurs & Other Heart Sounds - Normal exam - No Murmurs and No Systolic Clicks.  Abdomen Inspection Normal Exam - No Visible peristalsis and No Abnormal pulsations. Umbilicus - No Bleeding, No Urine drainage. Palpation/Percussion Normal exam - Soft, Non Tender, No Rebound tenderness, No Rigidity (guarding) and No Cutaneous hyperesthesia.  Male Genitourinary Sexual Maturity Tanner 5 - Adult hair pattern and Adult penile size and shape. Note: Perianal skin clear. No obvious posterior midline fissure but tender. Some anterior midline tenderness. Increased sphincter tone. Inflamed anal canal. Enlarged internal hemorrhoids. Barely tolerates anoscopy. RIGHT posterior and RIGHT anterior piles partially prolapsed. Irritated. Prostate smooth and not particularly enlarged. No discrete mass. No stricture. No pilonidal disease. No abscess or fistula for certain  Rectal Note: Tolerates digital exam today. Enlarged left lateral and right posterior internal hemorrhoids. Left bowel prolapses with Valsalva some blood easily. Right anterior did not has enlarged.  Perianal skin clean with  good hygiene. Minimal irritation. No major pruritis ani. No pilonidal disease. No fissure. No stricturing or narrowing. No abscess/fistula. No condyloma. No external hemorrhoids  Peripheral Vascular Upper Extremity Inspection - Left - No Cyanotic nailbeds, Not Ischemic. Right - No Cyanotic nailbeds, Not Ischemic.  Neurologic Neurologic evaluation reveals -normal attention span and ability  to concentrate, able to name objects and repeat phrases. Appropriate fund of knowledge , normal sensation and normal coordination. Mental Status Affect - not angry, not paranoid. Cranial Nerves-Normal Bilaterally. Gait-Normal.  Neuropsychiatric Mental status exam performed with findings of-able to articulate well with normal speech/language, rate, volume and coherence, thought content normal with ability to perform basic computations and apply abstract reasoning and no evidence of hallucinations, delusions, obsessions or homicidal/suicidal ideation.  Musculoskeletal Global Assessment Spine, Ribs and Pelvis - no instability, subluxation or laxity. Right Upper Extremity - no instability, subluxation or laxity.  Lymphatic Head & Neck General Head & Neck Lymphatics: Bilateral - Description - No Localized lymphadenopathy. Axillary General Axillary Region: Bilateral - Description - No Localized lymphadenopathy. Femoral & Inguinal Generalized Femoral & Inguinal Lymphatics: Left: Right - Description - No Localized lymphadenopathy. Description - No Localized lymphadenopathy.    Assessment & Plan Stephen Hector MD; 02/25/2016 1:51 PM)  COMPLEX THIRD DEGREE HEMORRHOID (K64.2) Impression: Persistent anal discomfort/itching and occasional bleeding. Prolapsing internal hemorrhoids. No evidence of fissure by history of physical exam. Anatomy improved but still concerning.  I think this patient wouldn't benefit from examination under anesthesia with hemorrhoidal ligation and pexy. He is too sensitive to  tolerate interoffice procedures. It was refractory to banding in the past. He has persistent symptoms despite banding done years ago.  The biggest challenges he tends to disappear or not follow through with recommendations. However agrees now compliant on a fiber bowel regimen. He did actually get his colonoscopy done. He seems more motivated to consider surgery now.  Current Plans Schedule for Surgery Written instructions provided The anatomy & physiology of the anorectal region was discussed. The pathophysiology of hemorrhoids and differential diagnosis was discussed. Natural history risks without surgery was discussed. I stressed the importance of a bowel regimen to have daily soft bowel movements to minimize progression of disease. Interventions such as sclerotherapy & banding were discussed.  The patient's symptoms are not adequately controlled by medicines and other non-operative treatments. I feel the risks & problems of no surgery outweigh the operative risks; therefore, I recommended surgery to treat the hemorrhoids by ligation, pexy, and possible resection.  Risks such as bleeding, infection, urinary difficulties, need for further treatment, heart attack, death, and other risks were discussed. I noted a good likelihood this will help address the problem. Goals of post-operative recovery were discussed as well. Possibility that this will not correct all symptoms was explained. Post-operative pain, bleeding, constipation, and other problems after surgery were discussed. We will work to minimize complications. Educational handouts further explaining the pathology, treatment options, and bowel regimen were given as well. Questions were answered. The patient expresses understanding & wishes to proceed with surgery.  Pt Education - [facility] Rectal Prep for Anorectal outpatient/office surgery: discussed with patient and provided information. Pt Education - [facility] Rectal Surgery HCI (Cola Highfill): discussed with  patient and provided information. Pt Education - [facility] Hemorrhoids (Hitomi Slape) Pt Education - [place] (Hatley Henegar) Pt Education - [facility] Pelvic Floor Exercises (Kegels) and Dysfunction HCI (Jacoya Bauman) Pt Education - [facility] Pain Control (Lary Eckardt)  [name], M.D., F.A.C.S. Gastrointestinal and Minimally Invasive Surgery [facility] [address], [place], [place] [address] [phone] Main / Paging

## 2016-02-29 ENCOUNTER — Encounter (HOSPITAL_COMMUNITY): Payer: Self-pay

## 2016-02-29 NOTE — Progress Notes (Signed)
ECHO 03-13-14 EPIC  EKG 07-09-15 EPIC

## 2016-02-29 NOTE — Patient Instructions (Addendum)
[name]  02/29/2016   Your procedure is scheduled on: 03-07-16  Report to [facility] Main  Entrance take [facility]  elevators to 3rd floor to  [place] at 830 AM.  Call this number if you have problems the morning of surgery [phone]   Remember: ONLY 1 PERSON MAY GO WITH YOU TO SHORT STAY TO GET  READY MORNING OF [name].  Do not eat food or drink liquids :After Midnight.     Take these medicines the morning of surgery with A SIP OF WATER: Bupropion (Wellbutrin); Omeprazole (Prilosec)                               You may not have any metal on your body including hair pins and              piercings  Do not wear jewelry, lotions, powders or colognes, deodorant                          Men may shave face and neck.   Do not bring valuables to the hospital. [name].  Contacts, dentures or bridgework may not be worn into surgery.  Leave suitcase in the car. After surgery it may be brought to your room.     Patients discharged the day of surgery will not be allowed to drive home.  Name and phone number of your driver:[name] (friend)     Special Instructions: FOLLOW SURGEON'S INSTRUCTION IN REGARDS TO BOWEL PREPARATION PRIOR TO SURGICAL PROCEDURE           _____________________________________________________________________             [name] - Preparing for Surgery Before surgery, you can play an important role.  Because skin is not sterile, your skin needs to be as free of germs as possible.  You can reduce the number of germs on your skin by washing with CHG (chlorahexidine gluconate) soap before surgery.  CHG is an antiseptic cleaner which kills germs and bonds with the skin to continue killing germs even after washing. Please DO NOT use if you have an allergy to CHG or antibacterial soaps.  If your skin becomes reddened/irritated stop using the CHG and inform your nurse when you  arrive at Short Stay. Do not shave (including legs and underarms) for at least 48 hours prior to the first CHG shower.  You may shave your face/neck. Please follow these instructions carefully:  1.  Shower with CHG Soap the night before surgery and the  morning of Surgery.  2.  If you choose to wash your hair, wash your hair first as usual with your  normal  shampoo.  3.  After you shampoo, rinse your hair and body thoroughly to remove the  shampoo.                           4.  Use CHG as you would any other liquid soap.  You can apply chg directly  to the skin and wash                       Gently with  a scrungie or clean washcloth.  5.  Apply the CHG Soap to your body ONLY FROM THE NECK DOWN.   Do not use on face/ open                           Wound or open sores. Avoid contact with eyes, ears mouth and genitals (private parts).                       Wash face,  Genitals (private parts) with your normal soap.             6.  Wash thoroughly, paying special attention to the area where your surgery  will be performed.  7.  Thoroughly rinse your body with warm water from the neck down.  8.  DO NOT shower/wash with your normal soap after using and rinsing off  the CHG Soap.                9.  Pat yourself dry with a clean towel.            10.  Wear clean pajamas.            11.  Place clean sheets on your bed the night of your first shower and do not  sleep with pets. Day of Surgery : Do not apply any lotions/deodorants the morning of surgery.  Please wear clean clothes to the hospital/surgery center.  FAILURE TO FOLLOW THESE INSTRUCTIONS MAY RESULT IN THE CANCELLATION OF YOUR SURGERY PATIENT SIGNATURE_________________________________  NURSE SIGNATURE__________________________________  ________________________________________________________________________

## 2016-03-03 ENCOUNTER — Ambulatory Visit (HOSPITAL_COMMUNITY)
Admission: RE | Admit: 2016-03-03 | Discharge: 2016-03-03 | Disposition: A | Discharge status: Home / Self Care | Payer: Managed Care, Other (non HMO) | Source: Ambulatory Visit | Attending: Anesthesiology | Admitting: Anesthesiology

## 2016-03-03 ENCOUNTER — Ambulatory Visit: Payer: Managed Care, Other (non HMO) | Admitting: Endocrinology

## 2016-03-03 ENCOUNTER — Encounter (HOSPITAL_COMMUNITY)
Admission: RE | Admit: 2016-03-03 | Discharge: 2016-03-03 | Disposition: A | Discharge status: Home / Self Care | Payer: Managed Care, Other (non HMO) | Source: Ambulatory Visit | Attending: Surgery | Admitting: Surgery

## 2016-03-03 ENCOUNTER — Encounter (HOSPITAL_COMMUNITY): Payer: Self-pay

## 2016-03-03 ENCOUNTER — Other Ambulatory Visit: Payer: Self-pay

## 2016-03-03 DIAGNOSIS — R079 Chest pain, unspecified: Secondary | ICD-10-CM | POA: Insufficient documentation

## 2016-03-03 HISTORY — DX: Headache, unspecified: R51.9

## 2016-03-03 HISTORY — DX: Headache: R51

## 2016-03-03 LAB — BASIC METABOLIC PANEL
Anion gap: 6 (ref 5–15)
BUN: 11 mg/dL (ref 6–20)
CALCIUM: 8.7 mg/dL — AB (ref 8.9–10.3)
CO2: 26 mmol/L (ref 22–32)
CREATININE: 0.96 mg/dL (ref 0.61–1.24)
Chloride: 103 mmol/L (ref 101–111)
GFR calc Af Amer: >60 mL/min (ref >60)
GLUCOSE: 96 mg/dL (ref 65–99)
Potassium: 3.9 mmol/L (ref 3.5–5.1)
Sodium: 135 mmol/L (ref 135–145)

## 2016-03-03 LAB — CBC
HCT: 43.2 % (ref 39.0–52.0)
Hemoglobin: 14.3 g/dL (ref 13.0–17.0)
MCH: 28.9 pg (ref 26.0–34.0)
MCHC: 33.1 g/dL (ref 30.0–36.0)
MCV: 87.3 fL (ref 78.0–100.0)
Platelets: 248 10*3/uL (ref 150–400)
RBC: 4.95 MIL/uL (ref 4.22–5.81)
RDW: 13.5 % (ref 11.5–15.5)
WBC: 5 10*3/uL (ref 4.0–10.5)

## 2016-03-06 NOTE — Progress Notes (Signed)
Pt aware of surgical time change. Aware to arrive at [facility] Stay 03/07/2016 at [place].

## 2016-03-07 ENCOUNTER — Encounter (HOSPITAL_COMMUNITY): Payer: Self-pay | Admitting: *Deleted

## 2016-03-07 ENCOUNTER — Ambulatory Visit (HOSPITAL_COMMUNITY)
Admission: RE | Admit: 2016-03-07 | Discharge: 2016-03-07 | Disposition: A | Discharge status: Home / Self Care | Payer: Managed Care, Other (non HMO) | Source: Ambulatory Visit | Attending: Surgery | Admitting: Surgery

## 2016-03-07 ENCOUNTER — Ambulatory Visit (HOSPITAL_COMMUNITY): Payer: Managed Care, Other (non HMO) | Admitting: Registered Nurse

## 2016-03-07 ENCOUNTER — Encounter (HOSPITAL_COMMUNITY)
Admission: RE | Disposition: A | Discharge status: Home / Self Care | Payer: Self-pay | Source: Ambulatory Visit | Attending: Surgery

## 2016-03-07 DIAGNOSIS — E7800 Pure hypercholesterolemia, unspecified: Secondary | ICD-10-CM | POA: Diagnosis not present

## 2016-03-07 DIAGNOSIS — K641 Second degree hemorrhoids: Secondary | ICD-10-CM | POA: Diagnosis not present

## 2016-03-07 DIAGNOSIS — K219 Gastro-esophageal reflux disease without esophagitis: Secondary | ICD-10-CM | POA: Diagnosis not present

## 2016-03-07 DIAGNOSIS — Z79899 Other long term (current) drug therapy: Secondary | ICD-10-CM | POA: Diagnosis not present

## 2016-03-07 DIAGNOSIS — F329 Major depressive disorder, single episode, unspecified: Secondary | ICD-10-CM | POA: Diagnosis not present

## 2016-03-07 DIAGNOSIS — K642 Third degree hemorrhoids: Secondary | ICD-10-CM | POA: Insufficient documentation

## 2016-03-07 HISTORY — PX: TRANSANAL HEMORRHOIDAL DEARTERIALIZATION: SHX6136

## 2016-03-07 SURGERY — TRANSANAL HEMORRHOIDAL DEARTERIALIZATION
Anesthesia: General | Site: Rectum

## 2016-03-07 MED ORDER — ONDANSETRON HCL 4 MG/2ML IJ SOLN
INTRAMUSCULAR | Status: DC | PRN
  Administered 2016-03-07: 4 mg via INTRAVENOUS

## 2016-03-07 MED ORDER — PROPOFOL 10 MG/ML IV BOLUS
INTRAVENOUS | Status: DC | PRN
  Administered 2016-03-07: 150 mg via INTRAVENOUS

## 2016-03-07 MED ORDER — BUPIVACAINE-EPINEPHRINE (PF) 0.25% -1:200000 IJ SOLN
INTRAMUSCULAR | Status: AC
  Filled 2016-03-07: qty 30

## 2016-03-07 MED ORDER — FENTANYL CITRATE (PF) 250 MCG/5ML IJ SOLN
INTRAMUSCULAR | Status: AC
  Filled 2016-03-07: qty 5

## 2016-03-07 MED ORDER — CEFAZOLIN SODIUM-DEXTROSE 2-4 GM/100ML-% IV SOLN
2.0000 g | INTRAVENOUS | Status: AC
  Administered 2016-03-07: 2 g via INTRAVENOUS
  Filled 2016-03-07: qty 100

## 2016-03-07 MED ORDER — OXYCODONE HCL 5 MG PO TABS
5.0000 mg | ORAL_TABLET | Freq: Once | ORAL | Status: AC | PRN
  Administered 2016-03-07: 5 mg via ORAL
  Filled 2016-03-07: qty 1

## 2016-03-07 MED ORDER — 0.9 % SODIUM CHLORIDE (POUR BTL) OPTIME
TOPICAL | Status: DC | PRN
  Administered 2016-03-07: 1000 mL

## 2016-03-07 MED ORDER — LACTATED RINGERS IV SOLN
INTRAVENOUS | Status: DC
Start: 2016-03-07 — End: 2016-03-07
  Administered 2016-03-07: 10:00:00 via INTRAVENOUS

## 2016-03-07 MED ORDER — CEFAZOLIN SODIUM-DEXTROSE 2-4 GM/100ML-% IV SOLN
INTRAVENOUS | Status: AC
  Filled 2016-03-07: qty 100

## 2016-03-07 MED ORDER — ROCURONIUM BROMIDE 50 MG/5ML IV SOLN
INTRAVENOUS | Status: AC
  Filled 2016-03-07: qty 1

## 2016-03-07 MED ORDER — TAMSULOSIN HCL 0.4 MG PO CAPS: 0.4000 mg | ORAL_CAPSULE | Freq: Every day | ORAL | Status: DC

## 2016-03-07 MED ORDER — DEXAMETHASONE SODIUM PHOSPHATE 10 MG/ML IJ SOLN
INTRAMUSCULAR | Status: AC
  Filled 2016-03-07: qty 1

## 2016-03-07 MED ORDER — BUPIVACAINE LIPOSOME 1.3 % IJ SUSP
20.0000 mL | INTRAMUSCULAR | Status: DC
  Filled 2016-03-07: qty 20

## 2016-03-07 MED ORDER — FENTANYL CITRATE (PF) 100 MCG/2ML IJ SOLN: 25.0000 ug | INTRAMUSCULAR | Status: DC | PRN

## 2016-03-07 MED ORDER — GLYCOPYRROLATE 0.2 MG/ML IJ SOLN
INTRAMUSCULAR | Status: AC
  Filled 2016-03-07: qty 1

## 2016-03-07 MED ORDER — CHLORHEXIDINE GLUCONATE 4 % EX LIQD: 1.0000 "application " | Freq: Once | CUTANEOUS | Status: DC

## 2016-03-07 MED ORDER — METRONIDAZOLE IN NACL 5-0.79 MG/ML-% IV SOLN
500.0000 mg | INTRAVENOUS | Status: AC
  Administered 2016-03-07: 500 mg via INTRAVENOUS
  Filled 2016-03-07: qty 100

## 2016-03-07 MED ORDER — ONDANSETRON HCL 4 MG/2ML IJ SOLN: 4.0000 mg | Freq: Once | INTRAMUSCULAR | Status: DC | PRN

## 2016-03-07 MED ORDER — DEXAMETHASONE SODIUM PHOSPHATE 10 MG/ML IJ SOLN
INTRAMUSCULAR | Status: DC | PRN
  Administered 2016-03-07: 10 mg via INTRAVENOUS

## 2016-03-07 MED ORDER — PROPOFOL 10 MG/ML IV BOLUS
INTRAVENOUS | Status: AC
  Filled 2016-03-07: qty 20

## 2016-03-07 MED ORDER — FENTANYL CITRATE (PF) 100 MCG/2ML IJ SOLN
INTRAMUSCULAR | Status: DC | PRN
  Administered 2016-03-07: 50 ug via INTRAVENOUS
  Administered 2016-03-07: 100 ug via INTRAVENOUS
  Administered 2016-03-07: 50 ug via INTRAVENOUS

## 2016-03-07 MED ORDER — METRONIDAZOLE IN NACL 5-0.79 MG/ML-% IV SOLN
INTRAVENOUS | Status: AC
  Filled 2016-03-07: qty 100

## 2016-03-07 MED ORDER — BUPIVACAINE LIPOSOME 1.3 % IJ SUSP
INTRAMUSCULAR | Status: DC | PRN
  Administered 2016-03-07: 20 mL

## 2016-03-07 MED ORDER — LIDOCAINE HCL (CARDIAC) 20 MG/ML IV SOLN
INTRAVENOUS | Status: AC
  Filled 2016-03-07: qty 5

## 2016-03-07 MED ORDER — GLYCOPYRROLATE 0.2 MG/ML IJ SOLN
INTRAMUSCULAR | Status: DC | PRN
  Administered 2016-03-07: 0.2 mg via INTRAVENOUS

## 2016-03-07 MED ORDER — MIDAZOLAM HCL 5 MG/5ML IJ SOLN
INTRAMUSCULAR | Status: DC | PRN
  Administered 2016-03-07: 2 mg via INTRAVENOUS

## 2016-03-07 MED ORDER — ONDANSETRON HCL 4 MG/2ML IJ SOLN
INTRAMUSCULAR | Status: AC
  Filled 2016-03-07: qty 2

## 2016-03-07 MED ORDER — MIDAZOLAM HCL 2 MG/2ML IJ SOLN
INTRAMUSCULAR | Status: AC
  Filled 2016-03-07: qty 2

## 2016-03-07 MED ORDER — HYDROCORTISONE 2.5 % RE CREA: 1.0000 "application " | TOPICAL_CREAM | Freq: Two times a day (BID) | RECTAL | Status: DC

## 2016-03-07 MED ORDER — OXYCODONE HCL 5 MG PO TABS: 5.0000 mg | ORAL_TABLET | ORAL | Status: DC | PRN

## 2016-03-07 MED ORDER — OXYCODONE HCL 5 MG/5ML PO SOLN
5.0000 mg | Freq: Once | ORAL | Status: AC | PRN
  Filled 2016-03-07: qty 5

## 2016-03-07 MED ORDER — SUCCINYLCHOLINE CHLORIDE 20 MG/ML IJ SOLN
INTRAMUSCULAR | Status: DC | PRN
  Administered 2016-03-07: 100 mg via INTRAVENOUS

## 2016-03-07 MED ORDER — LIDOCAINE HCL (CARDIAC) 20 MG/ML IV SOLN
INTRAVENOUS | Status: DC | PRN
  Administered 2016-03-07: 25 mg via INTRATRACHEAL
  Administered 2016-03-07: 100 mg via INTRAVENOUS

## 2016-03-07 MED ORDER — BUPIVACAINE-EPINEPHRINE (PF) 0.25% -1:200000 IJ SOLN
INTRAMUSCULAR | Status: DC | PRN
  Administered 2016-03-07: 30 mL

## 2016-03-07 SURGICAL SUPPLY — 28 items
BLADE SURG 15 STRL LF DISP TIS (BLADE) IMPLANT
BLADE SURG 15 STRL SS (BLADE)
BRIEF STRETCH FOR OB PAD LRG (UNDERPADS AND DIAPERS) ×2 IMPLANT
COVER SURGICAL LIGHT HANDLE (MISCELLANEOUS) ×2 IMPLANT
DECANTER SPIKE VIAL GLASS SM (MISCELLANEOUS) ×2 IMPLANT
DRAPE LAPAROTOMY T 102X78X121 (DRAPES) ×2 IMPLANT
DRSG PAD ABDOMINAL 8X10 ST (GAUZE/BANDAGES/DRESSINGS) IMPLANT
ELECT PENCIL ROCKER SW 15FT (MISCELLANEOUS) IMPLANT
ELECT REM PT RETURN 9FT ADLT (ELECTROSURGICAL) ×2
ELECTRODE REM PT RTRN 9FT ADLT (ELECTROSURGICAL) ×1 IMPLANT
GAUZE SPONGE 4X4 12PLY STRL (GAUZE/BANDAGES/DRESSINGS) ×1 IMPLANT
GAUZE SPONGE 4X4 16PLY XRAY LF (GAUZE/BANDAGES/DRESSINGS) ×2 IMPLANT
GLOVE BIOGEL PI IND STRL 8 (GLOVE) ×1 IMPLANT
GLOVE BIOGEL PI INDICATOR 8 (GLOVE) ×1
GLOVE ECLIPSE 8.0 STRL XLNG CF (GLOVE) ×2 IMPLANT
GOWN STRL REUS W/TWL XL LVL3 (GOWN DISPOSABLE) ×4 IMPLANT
KIT BASIN OR (CUSTOM PROCEDURE TRAY) ×2 IMPLANT
KIT SLIDE ONE PROLAPS HEMORR (KITS) ×2 IMPLANT
LUBRICANT JELLY K Y 4OZ (MISCELLANEOUS) ×2 IMPLANT
NEEDLE HYPO 22GX1.5 SAFETY (NEEDLE) ×2 IMPLANT
PACK BASIC VI WITH GOWN DISP (CUSTOM PROCEDURE TRAY) ×2 IMPLANT
PAD ABD 8X10 STRL (GAUZE/BANDAGES/DRESSINGS) ×1 IMPLANT
SUT CHROMIC 3 0 SH 27 (SUTURE) IMPLANT
SUT VIC AB 2-0 UR6 27 (SUTURE) IMPLANT
SYR 20CC LL (SYRINGE) ×2 IMPLANT
TOWEL OR 17X26 10 PK STRL BLUE (TOWEL DISPOSABLE) ×2 IMPLANT
TOWEL OR NON WOVEN STRL DISP B (DISPOSABLE) ×2 IMPLANT
YANKAUER SUCT BULB TIP 10FT TU (MISCELLANEOUS) ×2 IMPLANT

## 2016-03-07 NOTE — Op Note (Addendum)
03/07/2016  1:45 PM  PATIENT:  [name]  55 y.o. male  Patient Care Team: Renato Shin, MD as PCP - General Chucky May, MD as Consulting Physician (Psychiatry) Michael Boston, MD as Consulting Physician (General Surgery) Sueanne Margarita, MD as Consulting Physician (Cardiology)  PRE-OPERATIVE DIAGNOSIS:  Anal pain internal hemorrhoids with pain, bleeding and intermittent prolapse   POST-OPERATIVE DIAGNOSIS:  Anal pain internal hemorrhoids with pain, bleeding and intermittent prolapse   PROCEDURE:    TRANSANAL HEMORRHOIDAL DEARTERIALIZATION HEMORRHOIDAL LIGATION/PEXY  EXAM UNDER ANESTHESIA   SURGEON:  Surgeon(s): Michael Boston, MD  ASSISTANT: none   ANESTHESIA:   Local field block Anorectal block General  0.25% bupivacaine with epinephrine at the beginning of the case.  Liposomal bupivacaine (Experel) at the end of the case.  EBL:     Delay start of Pharmacological VTE agent (>24hrs) due to surgical blood loss or risk of bleeding:  no  DRAINS: none   SPECIMEN:  No Specimen  DISPOSITION OF SPECIMEN:  N/A  COUNTS:  YES  PLAN OF CARE: Discharge to home after PACU  PATIENT DISPOSITION:  PACU - hemodynamically stable.  INDICATION: Patient with worsening hemorrhoids.  Refractory to banding.  Too sensitive and difficult to approach an office.  Therefore I offered operative examination under anesthesia with probable hemorrhoid ligation possible hemorrhoidectomy.  He has had hesitancy for this; but, now most 2 years later, he is ready to consider hemorrhoid surgery.  The anatomy & physiology of the anorectal region was discussed.  The pathophysiology of hemorrhoids and differential diagnosis was discussed.  Natural history risks without surgery was discussed.   I stressed the importance of a bowel regimen to have daily soft bowel movements to minimize progression of disease.  Interventions such as sclerotherapy & banding were discussed.  The patient's symptoms are not  adequately controlled by medicines and other non-operative treatments.  I feel the risks & problems of no surgery outweigh the operative risks; therefore, I recommended surgery to treat the hemorrhoids by ligation, pexy, and possible resection.  Risks such as bleeding, infection, urinary difficulties, need for further treatment, heart attack, death, and other risks were discussed.   I noted a good likelihood this will help address the problem.  Goals of post-operative recovery were discussed as well.  Possibility that this will not correct all symptoms was explained.  Post-operative pain, bleeding, constipation, and other problems after surgery were discussed.  We will work to minimize complications.   Educational handouts further explaining the pathology, treatment options, and bowel regimen were given as well.  Questions were answered.  The patient expresses understanding & wishes to proceed with surgery.   OR FINDINGS: Grade 2 and 3 internal hemorrhoids with intermittent prolapse.  No external hemorrhoids.  No fissure.  No fistula.  No abscess.  No tumor.  DESCRIPTION:   Informed consent was confirmed. Patient underwent general anesthesia without difficulty. Patient was placed into prone positioning.  The perianal region was prepped and draped in sterile fashion. Surgical time-out confirmed our plan.  I did digital rectal examination and then transitioned over to anoscopy to get a sense of the anatomy.  I switched over to the [facility] fiberoptically lit Doppler anocope.   Using the Doppler on the tip of the [place] anoscope, I identified the arterial hemorrhoidal vessels coming in in the classic hexagonal anatomical pattern (right posterior/lateral/anterior, left posterior /lateral/anterior).    I proceeded to ligate the hemorrhoidal arteries. I used a 2-0 Vicryl suture on a UR-6 needle in  a figure-of-eight fashion over the signal around 6 cm proximal to the anal verge. I then ran that stitch longitudinally  more distally to the white line of [place]. I then tied that stitch down to cause a hemorrhoidopexy. I did that for all 6 locations.    I redid Doppler anoscopy.  Signals had gone away.  At completion of this, all hemorrhoids were reduced into the rectum. There is no more prolapse. External anatomy looked normal.  Mild narrowing on the posterior rectum.  I trimmed off on the stitches and redid it so that it was more patent and less narrowed.  I repeated anoscopy and examination.  Hemostasis was good.  Patient is being extubated go to recovery room.  I had discussed postop care in detail with the patient in the preop holding area.  Instructions are written.  There are no friends or family available to discuss when things immediately.  Instructions are written out.  Patient is aware.  We will follow closely.    [name], M.D., F.A.C.S. Gastrointestinal and Minimally Invasive Surgery [facility] [address] [place],  [address] [phone] Main / Paging

## 2016-03-07 NOTE — Progress Notes (Addendum)
Patient unable to void post op after rectal surgery. Bladder scanned showed 646ml of urine in bladder. 16 french catheter inserted as per Dr Johney Maine. Patient to call [facility] on 03/13/16 to get catheter removed.

## 2016-03-07 NOTE — Interval H&P Note (Signed)
History and Physical Interval Note:  03/07/2016 11:30 AM  [name]  has presented today for surgery, with the diagnosis of Anal pain internal hemorrhoids with pain,keating and intermittent prolapse   The various methods of treatment have been discussed with the patient and family. After consideration of risks, benefits and other options for treatment, the patient has consented to  Procedure(s): TRANSANAL HEMORRHOIDAL DEARTERIALIZATION HEMORRHOIDAL LIGATION/PEXY EXAM UNDER ANESTHESIA WITH  POSSIBLE HEMORRHOIDECTOMY POSSIBLE ANAL BIOPSY  (N/A) as a surgical intervention .  The patient's history has been reviewed, patient examined, no change in status, stable for surgery.  I have reviewed the patient's chart and labs.  Questions were answered to the patient's satisfaction.     [name] C.

## 2016-03-07 NOTE — Anesthesia Procedure Notes (Signed)
Procedure Name: Intubation Date/Time: 03/07/2016 12:36 PM Performed by: Maxwell Caul Pre-anesthesia Checklist: Patient identified, Emergency Drugs available, Suction available and Patient being monitored Patient Re-evaluated:Patient Re-evaluated prior to inductionOxygen Delivery Method: Circle System Utilized Preoxygenation: Pre-oxygenation with 100% oxygen Intubation Type: IV induction Ventilation: Mask ventilation without difficulty Laryngoscope Size: Mac and 4 Grade View: Grade II Tube type: Oral Tube size: 7.5 mm Number of attempts: 1 Airway Equipment and Method: Stylet and Oral airway Placement Confirmation: ETT inserted through vocal cords under direct vision,  positive ETCO2 and breath sounds checked- equal and bilateral Secured at: 21 cm Tube secured with: Tape Dental Injury: Teeth and Oropharynx as per pre-operative assessment

## 2016-03-07 NOTE — Transfer of Care (Signed)
Immediate Anesthesia Transfer of Care Note  Patient: [name]  Procedure(s) Performed: Procedure(s): TRANSANAL HEMORRHOIDAL DEARTERIALIZATION HEMORRHOIDAL LIGATION/PEXY EXAM UNDER ANESTHESIA WITH  POSSIBLE HEMORRHOIDECTOMY  (N/A)  Patient Location: PACU  Anesthesia Type:General  Level of Consciousness: awake, alert , oriented and patient cooperative  Airway & Oxygen Therapy: Patient Spontanous Breathing and Patient connected to face mask oxygen  Post-op Assessment: Report given to RN, Post -op Vital signs reviewed and stable and Patient moving all extremities X 4  Post vital signs: stable  Last Vitals:  Filed Vitals:   03/07/16 0917  BP: 128/91  Pulse: 77  Temp: 36.4 C  Resp: 16    Last Pain: There were no vitals filed for this visit.       Complications: No apparent anesthesia complications

## 2016-03-07 NOTE — Discharge Instructions (Signed)
ANORECTAL SURGERY:  POST OPERATIVE INSTRUCTIONS  1. Take your usually prescribed home medications unless otherwise directed. 2. DIET: Follow a light bland diet the first 24 hours after arrival home, such as soup, liquids, crackers, etc.  Be sure to include lots of fluids daily.  Avoid fast food or heavy meals as your are more likely to get nauseated.  Eat a low fat the next few days after surgery.   3. PAIN CONTROL: a. Pain is best controlled by a usual combination of three different methods TOGETHER: i. Ice/Heat ii. Over the counter pain medication iii. Prescription pain medication b. Most patients will experience some swelling and discomfort in the anus/rectal area. and incisions.  Ice packs or heat (30-60 minutes up to 6 times a day) will help. Use ice for the first few days to help decrease swelling and bruising, then switch to heat such as warm towels, sitz baths, warm baths, etc to help relax tight/sore spots and speed recovery.  Some people prefer to use ice alone, heat alone, alternating between ice & heat.  Experiment to what works for you.  Swelling and bruising can take several weeks to resolve.   c. It is helpful to take an over-the-counter pain medication regularly for the first few weeks.  Choose one of the following that works best for you: i. Naproxen (Aleve, etc)  Two 232m tabs twice a day ii. Ibuprofen (Advil, etc) Three 2017mtabs four times a day (every meal & bedtime) iii. Acetaminophen (Tylenol, etc) 500-65058mour times a day (every meal & bedtime) d. A  prescription for pain medication (such as oxycodone, hydrocodone, etc) should be given to you upon discharge.  Take your pain medication as prescribed.  i. If you are having problems/concerns with the prescription medicine (does not control pain, nausea, vomiting, rash, itching, etc), please call us [phone] see if we need to switch you to a different pain medicine that will work better for you and/or control your  side effect better. ii. If you need a refill on your pain medication, please contact your pharmacy.  They will contact our office to request authorization. Prescriptions will not be filled after 5 pm or on week-ends.  Use a Sitz Bath 4-8 times a day for relief   [employer] bath is a warm water bath taken in the sitting position that covers only the hips and buttocks. It may be used for either healing or hygiene purposes. Sitz baths are also used to relieve pain, itching, or muscle spasms. The water may contain medicine. Moist heat will help you heal and relax.  HOME CARE INSTRUCTIONS  Take 3 to 4 sitz baths a day. 1. Fill the bathtub half full with warm water. 2. Sit in the water and open the drain a little. 3. Turn on the warm water to keep the tub half full. Keep the water running constantly. 4. Soak in the water for 15 to 20 minutes. 5. After the sitz bath, pat the affected area dry first.   4. KEEP YOUR BOWELS REGULAR a. The goal is one bowel movement a day b. Avoid getting constipated.  Between the surgery and the pain medications, it is common to experience some constipation.  Increasing fluid intake and taking a fiber supplement (such as Metamucil, Citrucel, FiberCon, MiraLax, etc) 1-2 times a day regularly will usually help prevent this problem from occurring.  A mild laxative (prune juice, Milk of Magnesia, MiraLax, etc) should be taken according to package  directions if there are no bowel movements after 48 hours. c. Watch out for diarrhea.  If you have many loose bowel movements, simplify your diet to bland foods & liquids for a few days.  Stop any stool softeners and decrease your fiber supplement.  Switching to mild anti-diarrheal medications (Kayopectate, Pepto Bismol) can help.  If this worsens or does not improve, please call us.  5. Wound Care  a. Remove your bandages the day after surgery.  Unless discharge instructions indicate otherwise, leave your bandage dry and in  place overnight.  Remove the bandage during your first bowel movement.   b. Wear an absorbent pad or soft cotton gauze in your underwear as needed to catch any drainage and help keep the area  c. Keep the area clean and dry.  Bathe / shower every day.  Keep the area clean by showering / bathing over the incision / wound.   It is okay to soak an open wound to help wash it.  Wet wipes or showers / gentle washing after bowel movements is often less traumatic than regular toilet paper. d. Dennis Bast will often notice bleeding with bowel movements.  This should slow down by the end of the first week of surgery e. Expect some drainage.  This should slow down, too, by the end of the first week of surgery.  Wear an absorbent pad or soft cotton gauze in your underwear until the drainage stops.  6. ACTIVITIES as tolerated:   a. You may resume regular (light) daily activities beginning the next day--such as daily self-care, walking, climbing stairs--gradually increasing activities as tolerated.  If you can walk 30 minutes without difficulty, it is safe to try more intense activity such as jogging, treadmill, bicycling, low-impact aerobics, swimming, etc. b. Save the most intensive and strenuous activity for last such as sit-ups, heavy lifting, contact sports, etc  Refrain from any heavy lifting or straining until you are off narcotics for pain control.   c. DO NOT PUSH THROUGH PAIN.  Let pain be your guide: If it hurts to do something, don't do it.  Pain is your body warning you to avoid that activity for another week until the pain goes down. d. You may drive when you are no longer taking prescription pain medication, you can comfortably sit for long periods of time, and you can safely maneuver your car and apply brakes. e. Dennis Bast may have sexual intercourse when it is comfortable.  7. FOLLOW UP in our office a. Please call [facility] at [phone] to set up an appointment to see your surgeon in the office for a follow-up  appointment approximately 2 weeks after your surgery. b. Make sure that you call for this appointment the day you arrive home to insure a convenient appointment time. 10. IF YOU HAVE DISABILITY OR FAMILY LEAVE FORMS, BRING THEM TO THE OFFICE FOR PROCESSING.  DO NOT GIVE THEM TO YOUR DOCTOR.        WHEN TO CALL us [phone]: 1. Poor pain control 2. Reactions / problems with new medications (rash/itching, nausea, etc)  3. Fever over 101.5 F (38.5 C) 4. Inability to urinate 5. Nausea and/or vomiting 6. Worsening swelling or bruising 7. Continued bleeding from incision. 8. Increased pain, redness, or drainage from the incision  The clinic staff is available to answer your questions during regular business hours (8:30am-5pm).  Please dont hesitate to call and ask to speak to one of our nurses for clinical concerns.   A  surgeon from [facility] is always on call at the hospitals   If you have a medical emergency, go to the nearest emergency room or call 911.    [facility], [place], [place], [place], [place]  [address] ? MAIN: [phone] ? TOLL FREE: [phone] ? FAX [phone] [email or web address]    HEMORRHOIDS  The rectum is the last foot of your colon, and it naturally stretches to hold stool.  Hemorrhoidal piles are natural clusters of blood vessels that help the rectum and anal canal stretch to hold stool and allow bowel movements to eliminate feces.   Hemorrhoids are abnormally swollen blood vessels in the rectum.  Too much pressure in the rectum causes hemorrhoids by forcing blood to stretch and bulge the walls of the veins, sometimes even rupturing them.  Hemorrhoids can become like varicose veins you might see on a person's legs.  Most people will develop a flare of hemorrhoids in their lifetime.  When bulging hemorrhoidal veins are irritated, they can swell, burn, itch, cause pain, and bleed.  Most flares  will calm down gradually own within a few weeks.  However, once hemorrhoids are created, they are difficult to get rid of completely and tend to flare more easily than the first flare.   Fortunately, good habits and simple medical treatment usually control hemorrhoids well, and surgery is needed only in severe cases. Types of Hemorrhoids:  Internal hemorrhoids usually don't initially hurt or itch; they are deep inside the rectum and usually have no sensation. If they begin to push out (prolapse), pain and burning can occur.  However, internal hemorrhoids can bleed.  Anal bleeding should not be ignored since bleeding could come from a dangerous source like colorectal cancer, so persistent rectal bleeding should be investigated by a doctor, sometimes with a colonoscopy.  External hemorrhoids cause most of the symptoms - pain, burning, and itching. Nonirritated hemorrhoids can look like small skin tags coming out of the anus.   Thrombosed hemorrhoids can form when a hemorrhoid blood vessel bursts and causes the hemorrhoid to suddenly swell.  A purple blood clot can form in it and become an excruciatingly painful lump at the anus. Because of these unpleasant symptoms, immediate incision and drainage by a surgeon at an office visit can provide much relief of the pain.    PREVENTION Avoiding the most frequent causes listed below will prevent most cases of hemorrhoids: Constipation Hard stools Diarrhea  Constant sitting  Straining with bowel movements Sitting on the toilet for a long time  Severe coughing  episodes Pregnancy / Childbirth  Heavy Lifting  Sometimes avoiding the above triggers is difficult:  How can you avoid sitting all day if you have a seated job? Also, we try to avoid coughing and diarrhea, but sometimes its beyond your control.  Still, there are some practical hints to help: Keep the anal and genital area clean.  Moistened tissues such as flushable wet wipes are less irritating than  toilet paper.  Using irrigating showers or bottle irrigation washing gently cleans this sensitive area.   Avoid dry toilet paper when cleaning after bowel movements.  Marland Kitchen Keep the anal and genital area dry.  Lightly pat the rectal area dry.  Avoid rubbing.  Talcum or baby powders can help GET YOUR STOOLS SOFT.   This is the most important way to prevent irritated hemorrhoids.  Hard stools are like sandpaper to the anorectal canal and will cause more problems.  The  goal: ONE SOFT BOWEL MOVEMENT A DAY!  BMs from every other day to 3 times a day is a tolerable range Treat coughing, diarrhea and constipation early since irritated hemorrhoids may soon follow.  If your main job activity is seated, always stand or walk during your breaks. Make it a point to stand and walk at least 5 minutes every hour and try to shift frequently in your chair to avoid direct rectal pressure.  Always exhale as you strain or lift. Don't hold your breath.  Do not delay or try to prevent a bowel movement when the urge is present. Exercise regularly (walking or jogging 60 minutes a day) to stimulate the bowels to move. No reading or other activity while on the toilet. If bowel movements take longer than 5 minutes, you are too constipated. AVOID CONSTIPATION Drink plenty of liquids (1 1/2 to 2 quarts of water and other fluids a day unless fluid restricted for another medical condition). Liquids that contain caffeine (coffee a, tea, soft drinks) can be dehydrating and should be avoided until constipation is controlled. Consider minimizing milk, as dairy products may be constipating. Eat plenty of fiber (30g a day ideal, more if needed).  Fiber is the undigested part of plant food that passes into the colon, acting as natures broom to encourage bowel motility and movement.  Fiber can absorb and hold large amounts of water. This results in a larger, bulkier stool, which is soft and easier to pass.  Eating foods high in fiber - 12  servings - such as  Vegetables: Root (potatoes, carrots, turnips), Leafy green (lettuce, salad greens, celery, spinach), High residue (cabbage, broccoli, etc.) Fruit: Fresh, Dried (prunes, apricots, cherries), Stewed (applesauce)  Whole grain breads, pasta, whole wheat Bran cereals, muffins, etc. Consider adding supplemental bulking fiber which retains large volumes of water: Psyllium ground seeds (native plant from [place])--available as Metamucil, Konsyl, Effersyllium, Per Diem Fiber, or the less expensive generic forms.  Citrucel  (methylcellulose wood fiber) . FiberCon (Polycarbophil) Polyethylene Glycol - and artificial fiber commonly called Miralax or Glycolax.  It is helpful for people with gassy or bloated feelings with regular fiber Flax Seed - a less gassy natural fiber  Laxatives can be useful for a short period if constipation is severe Osmotics (Milk of Magnesia, Fleets Phospho-Soda, Magnesium Citrate)  Stimulants (Senokot,   Castor Oil,  Dulcolax, Ex-Lax)    Laxatives are not a good long-term solution as it can stress the bowels and cause too much mineral loss and dehydration.   Avoid taking laxatives for more than 7 days in a row.  AVOID DIARRHEA Switch to liquids and simpler foods for a few days to avoid stressing your intestines further. Avoid dairy products (especially milk & ice cream) for a short time.  The intestines often can lose the ability to digest lactose when stressed. Avoid foods that cause gassiness or bloating.  Typical foods include beans and other legumes, cabbage, broccoli, and dairy foods.  Every person has some sensitivity to other foods, so listen to your body and avoid those foods that trigger problems for you. Adding fiber (Citrucel, Metamucil, FiberCon, Flax seed, Miralax) gradually can help thicken stools by absorbing excess fluid and retrain the intestines to act more normally.  Slowly increase the dose over a few weeks.  Too much fiber too soon  can backfire and cause cramping & bloating. Probiotics (such as active yogurt, Align, etc) may help repopulate the intestines and colon with normal bacteria and calm down  a sensitive digestive tract.  Most studies show it to be of mild help, though, and such products can be costly. Medicines: Bismuth subsalicylate (ex. Kayopectate, Pepto Bismol) every 30 minutes for up to 6 doses can help control diarrhea.  Avoid if pregnant. Loperamide (Immodium) can slow down diarrhea.  Start with two tablets (4mg  total) first and then try one tablet every 6 hours.  Avoid if you are having fevers or severe pain.  If you are not better or start feeling worse, stop all medicines and call your doctor for advice Call your doctor if you are getting worse or not better.  Sometimes further testing (cultures, endoscopy, X-ray studies, bloodwork, etc) may be needed to help diagnose and treat the cause of the diarrhea.  TROUBLESHOOTING IRREGULAR BOWELS 1) Avoid extremes of bowel movements (no bad constipation/diarrhea) 2) Miralax 17gm mixed in 8oz. water or juice-daily. May use BID as needed.  3) Gas-x,Phazyme, etc. as needed for gas & bloating.  4) Soft,bland diet. No spicy,greasy,fried foods.  5) Prilosec over-the-counter as needed  6) May hold gluten/wheat products from diet to see if symptoms improve.  7)  May try probiotics (Align, Activa, etc) to help calm the bowels down 7) If symptoms become worse call back immediately.   TREATMENT OF HEMORRHOID FLARE If these preventive measures fail, you must take action right away! Hemorrhoids are one condition that can be mild in the morning and become intolerable by nightfall. Most hemorrhoidal flares take several weeks to calm down.  These suggestions can help: Warm soaks.  This helps more than any topical medication.  Use up to 8 times a day.  Usually sitz baths or sitting in a warm bathtub helps.  Sitting on moist warm towels are helpful.  Switching to ice packs/cool  compresses can be helpful  Use a Sitz Bath 4-8 times a day for relief A sitz bath is a warm water bath taken in the sitting position that covers only the hips and buttocks. It may be used for either healing or hygiene purposes. Sitz baths are also used to relieve pain, itching, or muscle spasms. The water may contain medicine. Moist heat will help you heal and relax.  HOME CARE INSTRUCTIONS  Take 3 to 4 sitz baths a day. 6. Fill the bathtub half full with warm water. 7. Sit in the water and open the drain a little. 8. Turn on the warm water to keep the tub half full. Keep the water running constantly. 9. Soak in the water for 15 to 20 minutes. 10. After the sitz bath, pat the affected area dry first. SEEK MEDICAL CARE IF:  You get worse instead of better. Stop the sitz baths if you get worse.  Normalize your bowels.  Extremes of diarrhea or constipation will make hemorrhoids worse.  One soft bowel movement a day is the goal.  Fiber can help get your bowels regular Wet wipes instead of toilet paper Pain control with a NSAID such as ibuprofen (Advil) or naproxen (Aleve) or acetaminophen (Tylenol) around the clock.  Narcotics are constipating and should be minimized if possible Topical creams contain steroids (bydrocortisone) or local anesthetic (xylocaine) can help make pain and itching more tolerable.   EVALUATION If hemorrhoids are still causing problems, you could benefit by an evaluation by a surgeon.  The surgeon will obtain a history and examine you.  If hemorrhoids are diagnosed, some therapies can be offered in the office, usually with an anoscope into the less sensitive area of the  rectum: -injection of hemorrhoids (sclerotherapy) can scar the blood vessels of the swollen/enlarged hemorrhoids to help shrink them down to a more normal size -rubber banding of the enlarged hemorrhoids to help shrink them down to a more normal size -drainage of the blood clot causing a thrombosed hemorrhoid,   to relieve the severe pain   While 90% of the time such problems from hemorrhoids can be managed without preceding to surgery, sometimes the hemorrhoids require a operation to control the problem (uncontrolled bleeding, prolapse, pain, etc.).   This involves being placed under general anesthesia where the surgeon can confirm the diagnosis and remove, suture, or staple the hemorrhoid(s).  Your surgeon can help you treat the problem appropriately.    General Anesthesia, Adult, Care After Refer to this sheet in the next few weeks. These instructions provide you with information on caring for yourself after your procedure. Your health care provider may also give you more specific instructions. Your treatment has been planned according to current medical practices, but problems sometimes occur. Call your health care provider if you have any problems or questions after your procedure. WHAT TO EXPECT AFTER THE PROCEDURE After the procedure, it is typical to experience: Sleepiness. Nausea and vomiting. HOME CARE INSTRUCTIONS For the first 24 hours after general anesthesia: Have a responsible person with you. Do not drive a car. If you are alone, do not take public transportation. Do not drink alcohol. Do not take medicine that has not been prescribed by your health care provider. Do not sign important papers or make important decisions. You may resume a normal diet and activities as directed by your health care provider. Change bandages (dressings) as directed. If you have questions or problems that seem related to general anesthesia, call the hospital and ask for the anesthetist or anesthesiologist on call. SEEK MEDICAL CARE IF: You have nausea and vomiting that continue the day after anesthesia. You develop a rash. SEEK IMMEDIATE MEDICAL CARE IF:  You have difficulty breathing. You have chest pain. You have any allergic problems.   This information is not intended to replace advice given to  you by your health care provider. Make sure you discuss any questions you have with your health care provider.   Document Released: 01/19/2001 Document Revised: 11/03/2014 Document Reviewed: 02/11/2012 Elsevier Interactive Patient Education 2016 [place], Adult A Foley catheter is a soft, flexible tube. This tube is placed into your bladder to drain pee (urine). If you go home with this catheter in place, follow the instructions below. TAKING CARE OF THE CATHETER 11. Wash your hands with soap and water. 12. Put soap and water on a clean washcloth.  Clean the skin where the tube goes into your body.  Clean away from the tube site.  Never wipe toward the tube.  Clean the area using a circular motion.  Remove all the soap. Pat the area dry with a clean towel. For males, reposition the skin that covers the end of the penis (foreskin). 13. Attach the tube to your leg with tape or a leg strap. Do not stretch the tube tight. If you are using tape, remove any stickiness left behind by past tape you used. 14. Keep the drainage bag below your hips. Keep it off the floor. 15. Check your tube during the day. Make sure it is working and draining. Make sure the tube does not curl, twist, or bend. 16. Do not pull on the tube or try to take it out.  TAKING CARE OF THE DRAINAGE BAGS You will have a large overnight drainage bag and a small leg bag. You may wear the overnight bag any time. Never wear the small bag at night. Follow the directions below. Emptying the Drainage Bag Empty your drainage bag when it is  - full or at least 2-3 times a day. 1. Wash your hands with soap and water. 2. Keep the drainage bag below your hips. 3. Hold the dirty bag over the toilet or clean container. 4. Open the pour spout at the bottom of the bag. Empty the pee into the toilet or container. Do not let the pour spout touch anything. 5. Clean the pour spout with a gauze pad or cotton ball that  has rubbing alcohol on it. 6. Close the pour spout. 7. Attach the bag to your leg with tape or a leg strap. 8. Wash your hands well. Changing the Drainage Bag Change your bag once a month or sooner if it starts to smell or look dirty.  1. Wash your hands with soap and water. 2. Pinch the rubber tube so that pee does not spill out. 3. Disconnect the catheter tube from the drainage tube at the connection valve. Do not let the tubes touch anything. 4. Clean the end of the catheter tube with an alcohol wipe. Clean the end of a the drainage tube with a different alcohol wipe. 5. Connect the catheter tube to the drainage tube of the clean drainage bag. 6. Attach the new bag to the leg with tape or a leg strap. Avoid attaching the new bag too tightly. 7. Wash your hands well. Cleaning the Drainage Bag 1. Wash your hands with soap and water. 2. Wash the bag in warm, soapy water. 3. Rinse the bag with warm water. 4. Fill the bag with a mixture of white vinegar and water (1 cup vinegar to 1 quart warm water [.2 liter vinegar to 1 liter warm water]). Close the bag and soak it for 30 minutes in the solution. 5. Rinse the bag with warm water. 6. Hang the bag to dry with the pour spout open and hanging downward. 7. Store the clean bag (once it is dry) in a clean plastic bag. 8. Wash your hands well. PREVENT INFECTION  Wash your hands before and after touching your tube.  Take showers every day. Wash the skin where the tube enters your body. Do not take baths. Replace wet leg straps with dry ones, if this applies.  Do not use powders, sprays, or lotions on the genital area. Only use creams, lotions, or ointments as told by your doctor.  For females, wipe from front to back after going to the bathroom.  Drink enough fluids to keep your pee clear or pale yellow unless you are told not to have too much fluid (fluid restriction).  Do not let the drainage bag or tubing touch or lie on the  floor.  Wear cotton underwear to keep the area dry. GET HELP IF:  Your pee is cloudy or smells unusually bad.  Your tube becomes clogged.  You are not draining pee into the bag or your bladder feels full.  Your tube starts to leak. GET HELP RIGHT AWAY IF:  You have pain, puffiness (swelling), redness, or yellowish-white fluid (pus) where the tube enters the body.  You have pain in the belly (abdomen), legs, lower back, or bladder.  You have a fever.  You see blood fill the tube, or your pee  is pink or red.  You feel sick to your stomach (nauseous), throw up (vomit), or have chills.  Your tube gets pulled out. MAKE SURE YOU:   Understand these instructions.  Will watch your condition.  Will get help right away if you are not doing well or get worse.   This information is not intended to replace advice given to you by your health care provider. Make sure you discuss any questions you have with your health care provider.   Document Released: 02/07/2013 Document Revised: 11/03/2014 Document Reviewed: 02/07/2013 Elsevier Interactive Patient Education [employer].

## 2016-03-07 NOTE — Anesthesia Preprocedure Evaluation (Signed)
Anesthesia Evaluation  Patient identified by MRN, date of birth, ID band Patient awake    Reviewed: Allergy & Precautions, NPO status , Patient's Chart, lab work & pertinent test results  Airway Mallampati: II  TM Distance: >3 FB Neck ROM: Full    Dental  (+) Teeth Intact, Dental Advisory Given   Pulmonary    breath sounds clear to auscultation       Cardiovascular  Rhythm:Regular Rate:Normal     Neuro/Psych    GI/Hepatic   Endo/Other    Renal/GU      Musculoskeletal   Abdominal   Peds  Hematology   Anesthesia Other Findings   Reproductive/Obstetrics                             Anesthesia Physical Anesthesia Plan  ASA: II  Anesthesia Plan: General   Post-op Pain Management:    Induction: Inhalational  Airway Management Planned: LMA  Additional Equipment:   Intra-op Plan:   Post-operative Plan: Extubation in OR  Informed Consent: I have reviewed the patients History and Physical, chart, labs and discussed the procedure including the risks, benefits and alternatives for the proposed anesthesia with the patient or authorized representative who has indicated his/her understanding and acceptance.   Dental advisory given  Plan Discussed with: CRNA and Anesthesiologist  Anesthesia Plan Comments:         Anesthesia Quick Evaluation

## 2016-03-07 NOTE — H&P (View-Only) (Signed)
[name] 02/25/2016 1:31 PM Location: [facility] Patient #: [id number] DOB: 03-07-1961 Single / Language: [name] / Race: White Male  History of Present Illness [name] MD; 02/25/2016 1:39 PM) The patient is a 55 year old male who presents with anal pain. Note for "Anal pain": The patient returns yet again to reconsider surgery yet again for his hemorrhoids.  Pleasant patient initially from [employer]. He has struggled with hemorrhoids. I had diagnosed hemorrhoids years ago and did banding. 3 months later he seemed to have a fissure. I recommended diltiazem gel. I recommended surgery if it did not improve. We called him several times with no answer. Patient tells me he never heard back from [place]. He do not fill diltiazem Rx. He did not call us back. Lost to follow-up. He returned last year. No definite fissure potentially very sensitive hemorrhoids. I offered surgery at this point has he had more chronically prolapsing hemorrhoids that were very sensitive and would not be amenable more successfully treated with anoscopic banding or injection. He did not schedule surgery.  He returns yet again. He still struggles with intermittent bleeding and pain. Occasionally feels something pop out. Usually reduces back and. Claims to have a bowel movement about every day or so. Not on any fiber supplement. Was supposed to get screening colonoscopy last year but postponed since he could not find a ride home. Finally got it done in December 2016. Tubular adenoma removed. No other  Patient notes he gets discomfort burning and bleeding with bowel movements. Occasionally sharp but not severe. Then gets asymptomatic for a month or so and then comes back. Because of persistent and worsening symptoms, he wished to be reevaluated. History of numerous lumbar and cervical spine issues requiring surgery. Has used narcotics in the past. I do not think he's on anything  currently. 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   Problem List/Past Medical [name], MD; 02/25/2016 1:34 PM) COMPLEX THIRD DEGREE HEMORRHOID [phone])  Other Problems [name], MD; 02/25/2016 1:34 PM) Anxiety Disorder Arthritis Back Pain Depression Gastroesophageal Reflux Disease Hemorrhoids Hypercholesterolemia Thyroid Disease  Past Surgical History [name], MD; 02/25/2016 1:34 PM) Spinal Surgery - Lower Back  Allergies Elbert Ewings, CMA; 02/25/2016 1:31 PM) No Known Drug Allergies 02/05/2015  Medication History Elbert Ewings, CMA; 02/25/2016 1:31 PM) Lipitor (40MG  Tablet, Oral) Active. Wellbutrin SR (150MG  Tablet ER 12HR, Oral) Active. Atrovent (0.03% Solution, Nasal) Active. Multivitamins (Oral) Active. Omeprazole (40MG  Capsule DR, Oral) Active. Travatan Z (0.004% Solution, Ophthalmic) Active. Medications Reconciled  Social History [name], MD; 02/25/2016 1:34 PM) Caffeine use Coffee, Tea. No alcohol use No drug use Tobacco use Never smoker.  Family History [name], MD; 02/25/2016 1:34 PM) Colon Cancer Father. Depression Sister.     Review of Systems [name], MD; 02/25/2016 1:34 PM) General Present- Fatigue. Not Present- Appetite Loss, Chills, Fever, Night Sweats, Weight Gain and Weight Loss. Skin Not Present- Change in Wart/Mole, Dryness, Hives, Jaundice, New Lesions, Non-Healing Wounds, Rash and Ulcer. HEENT Present- Seasonal Allergies. Not Present- Earache, Hearing Loss, Hoarseness, Nose Bleed, Oral Ulcers, Ringing in the Ears, Sinus Pain, Sore Throat, Visual Disturbances, Wears glasses/contact lenses and Yellow Eyes. Respiratory Present- Snoring. Not Present- Bloody sputum, Chronic Cough, Difficulty Breathing and Wheezing. Cardiovascular Not Present- Chest Pain, Difficulty Breathing Lying Down, Leg Cramps, Palpitations, Rapid Heart Rate, Shortness of Breath and Swelling of  Extremities. Gastrointestinal Present- Change in Bowel Habits,  Excessive gas and Hemorrhoids. Not Present- Abdominal Pain, Bloating, Bloody Stool, Chronic diarrhea, Constipation, Difficulty Swallowing, Gets full quickly at meals, Indigestion, Nausea, Rectal Pain and Vomiting. Male Genitourinary Not Present- Blood in Urine, Change in Urinary Stream, Frequency, Impotence, Nocturia, Painful Urination, Urgency and Urine Leakage.  Vitals Elbert Ewings CMA; 02/25/2016 1:32 PM) 02/25/2016 1:32 PM Weight: 157 lb Height: 65in Body Surface Area: 1.78 m Body Mass Index: 26.13 kg/m  Temp.: 97.75F  Pulse: 85 (Regular)  BP: 128/84 (Sitting, Left Arm, Standard)      Physical Exam [name] MD; 02/25/2016 1:43 PM)  General Mental Status-Alert. General Appearance-Not in acute distress, Not Sickly. Orientation-Oriented X3. Hydration-Well hydrated. Voice-Normal.  Integumentary Global Assessment Normal Exam - Axillae: non-tender, no inflammation or ulceration, no drainage. and Distribution of scalp and body hair is normal. General Characteristics Temperature - normal warmth is noted.  Head and Neck Head-normocephalic, atraumatic with no lesions or palpable masses. Face Global Assessment - atraumatic, no absence of expression. Neck Global Assessment - no abnormal movements, no bruit auscultated on the right, no bruit auscultated on the left, no decreased range of motion, non-tender. Trachea-midline. Thyroid Gland Characteristics - non-tender.  Eye Eyeball - Left-Extraocular movements intact, No Nystagmus. Eyeball - Right-Extraocular movements intact, No Nystagmus. Cornea - Left-No Hazy. Cornea - Right-No Hazy. Sclera/Conjunctiva - Left-No scleral icterus, No Discharge. Sclera/Conjunctiva - Right-No scleral icterus, No Discharge. Pupil - Left-Direct reaction to light normal. Pupil - Right-Direct reaction to light normal.  ENMT Ears Pinna -  Left - no drainage observed, no generalized tenderness observed. Right - no drainage observed, no generalized tenderness observed. Nose and Sinuses Nose - no destructive lesion observed. Nares - Left - quiet respiration. Right - quiet respiration. Mouth and Throat Lips - Upper Lip - no fissures observed, no pallor noted. Lower Lip - no fissures observed, no pallor noted. Nasopharynx - no discharge present. Oral Cavity/Oropharynx - Tongue - no dryness observed. Oral Mucosa - no cyanosis observed. Hypopharynx - no evidence of airway distress observed.  Chest and Lung Exam Inspection Movements - Normal and Symmetrical. Accessory muscles - No use of accessory muscles in breathing. Palpation Normal exam - Non-tender. Auscultation Breath sounds - Normal and Clear.  Cardiovascular Auscultation Rhythm - Regular. Murmurs & Other Heart Sounds - Normal exam - No Murmurs and No Systolic Clicks.  Abdomen Inspection Normal Exam - No Visible peristalsis and No Abnormal pulsations. Umbilicus - No Bleeding, No Urine drainage. Palpation/Percussion Normal exam - Soft, Non Tender, No Rebound tenderness, No Rigidity (guarding) and No Cutaneous hyperesthesia.  Male Genitourinary Sexual Maturity Tanner 5 - Adult hair pattern and Adult penile size and shape. Note: Perianal skin clear. No obvious posterior midline fissure but tender. Some anterior midline tenderness. Increased sphincter tone. Inflamed anal canal. Enlarged internal hemorrhoids. Barely tolerates anoscopy. RIGHT posterior and RIGHT anterior piles partially prolapsed. Irritated. Prostate smooth and not particularly enlarged. No discrete mass. No stricture. No pilonidal disease. No abscess or fistula for certain  Rectal Note: Tolerates digital exam today. Enlarged left lateral and right posterior internal hemorrhoids. Left bowel prolapses with Valsalva some blood easily. Right anterior did not has enlarged.  Perianal skin clean with  good hygiene. Minimal irritation. No major pruritis ani. No pilonidal disease. No fissure. No stricturing or narrowing. No abscess/fistula. No condyloma. No external hemorrhoids  Peripheral Vascular Upper Extremity Inspection - Left - No Cyanotic nailbeds, Not Ischemic. Right - No Cyanotic nailbeds, Not Ischemic.  Neurologic Neurologic evaluation reveals -normal attention span and ability  to concentrate, able to name objects and repeat phrases. Appropriate fund of knowledge , normal sensation and normal coordination. Mental Status Affect - not angry, not paranoid. Cranial Nerves-Normal Bilaterally. Gait-Normal.  Neuropsychiatric Mental status exam performed with findings of-able to articulate well with normal speech/language, rate, volume and coherence, thought content normal with ability to perform basic computations and apply abstract reasoning and no evidence of hallucinations, delusions, obsessions or homicidal/suicidal ideation.  Musculoskeletal Global Assessment Spine, Ribs and Pelvis - no instability, subluxation or laxity. Right Upper Extremity - no instability, subluxation or laxity.  Lymphatic Head & Neck General Head & Neck Lymphatics: Bilateral - Description - No Localized lymphadenopathy. Axillary General Axillary Region: Bilateral - Description - No Localized lymphadenopathy. Femoral & Inguinal Generalized Femoral & Inguinal Lymphatics: Left: Right - Description - No Localized lymphadenopathy. Description - No Localized lymphadenopathy.    Assessment & Plan [name] MD; 02/25/2016 1:51 PM)  COMPLEX THIRD DEGREE HEMORRHOID (K64.2) Impression: Persistent anal discomfort/itching and occasional bleeding. Prolapsing internal hemorrhoids. No evidence of fissure by history of physical exam. Anatomy improved but still concerning.  I think this patient wouldn't benefit from examination under anesthesia with hemorrhoidal ligation and pexy. He is too sensitive to  tolerate interoffice procedures. It was refractory to banding in the past. He has persistent symptoms despite banding done years ago.  The biggest challenges he tends to disappear or not follow through with recommendations. However agrees now compliant on a fiber bowel regimen. He did actually get his colonoscopy done. He seems more motivated to consider surgery now.  Current Plans Schedule for Surgery Written instructions provided The anatomy & physiology of the anorectal region was discussed. The pathophysiology of hemorrhoids and differential diagnosis was discussed. Natural history risks without surgery was discussed. I stressed the importance of a bowel regimen to have daily soft bowel movements to minimize progression of disease. Interventions such as sclerotherapy & banding were discussed.  The patient's symptoms are not adequately controlled by medicines and other non-operative treatments. I feel the risks & problems of no surgery outweigh the operative risks; therefore, I recommended surgery to treat the hemorrhoids by ligation, pexy, and possible resection.  Risks such as bleeding, infection, urinary difficulties, need for further treatment, heart attack, death, and other risks were discussed. I noted a good likelihood this will help address the problem. Goals of post-operative recovery were discussed as well. Possibility that this will not correct all symptoms was explained. Post-operative pain, bleeding, constipation, and other problems after surgery were discussed. We will work to minimize complications. Educational handouts further explaining the pathology, treatment options, and bowel regimen were given as well. Questions were answered. The patient expresses understanding & wishes to proceed with surgery.  Pt Education - [facility] Rectal Prep for Anorectal outpatient/office surgery: discussed with patient and provided information. Pt Education - [facility] Rectal Surgery HCI (Achilles Neville): discussed with  patient and provided information. Pt Education - [facility] Hemorrhoids (Tamiya Colello) Pt Education - [place] (Lea Walbert) Pt Education - [facility] Pelvic Floor Exercises (Kegels) and Dysfunction HCI (Roldan Laforest) Pt Education - [facility] Pain Control (Taraneh Metheney)  [name], M.D., F.A.C.S. Gastrointestinal and Minimally Invasive Surgery [facility] [address], [place], [place] [address] [phone] Main / Paging

## 2016-03-07 NOTE — Anesthesia Postprocedure Evaluation (Signed)
Anesthesia Post Note  Patient: [name]  Procedure(s) Performed: Procedure(s) (LRB): TRANSANAL HEMORRHOIDAL DEARTERIALIZATION HEMORRHOIDAL LIGATION/PEXY EXAM UNDER ANESTHESIA WITH  POSSIBLE HEMORRHOIDECTOMY  (N/A)  Patient location during evaluation: PACU Anesthesia Type: General Level of consciousness: awake, awake and alert and oriented Pain management: pain level controlled Vital Signs Assessment: post-procedure vital signs reviewed and stable Respiratory status: spontaneous breathing, nonlabored ventilation and respiratory function stable Cardiovascular status: blood pressure returned to baseline Anesthetic complications: no    Last Vitals:  Filed Vitals:   03/07/16 1415 03/07/16 1430  BP: 129/84 118/91  Pulse: 81 81  Temp:    Resp: 14 15    Last Pain:  Filed Vitals:   03/07/16 1431  PainSc: 4                  [name]

## 2016-03-10 ENCOUNTER — Encounter (HOSPITAL_COMMUNITY): Payer: Self-pay | Admitting: Surgery

## 2016-03-17 ENCOUNTER — Ambulatory Visit: Payer: Managed Care, Other (non HMO) | Admitting: Endocrinology

## 2016-04-22 IMAGING — CR DG HAND COMPLETE 3+V*R*
3 series · 3 of 3 positions shown · non-contrast
Comparison: 10/13/2011.

CLINICAL DATA: Fell and injured right thumb approximately 2 weeks
ago, diagnosed with a sprain at that time. Persistent pain
localizing to the MCP joint. Subsequent encounter.

EXAM:
RIGHT HAND - COMPLETE 3+ VIEW

[view not recorded (1 of 3)]
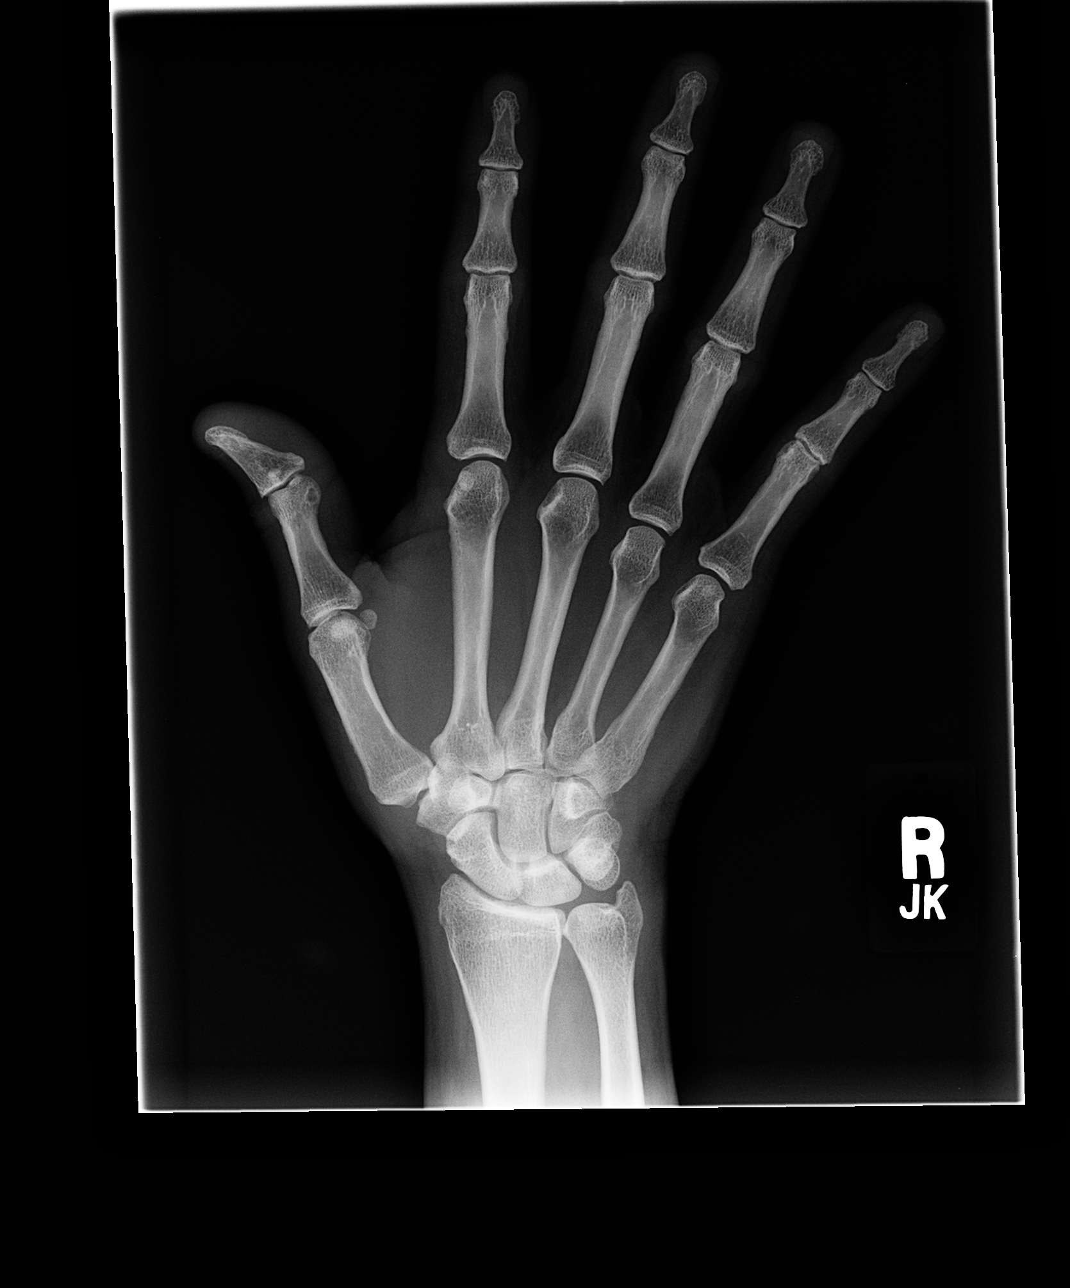

[view not recorded (2 of 3)]
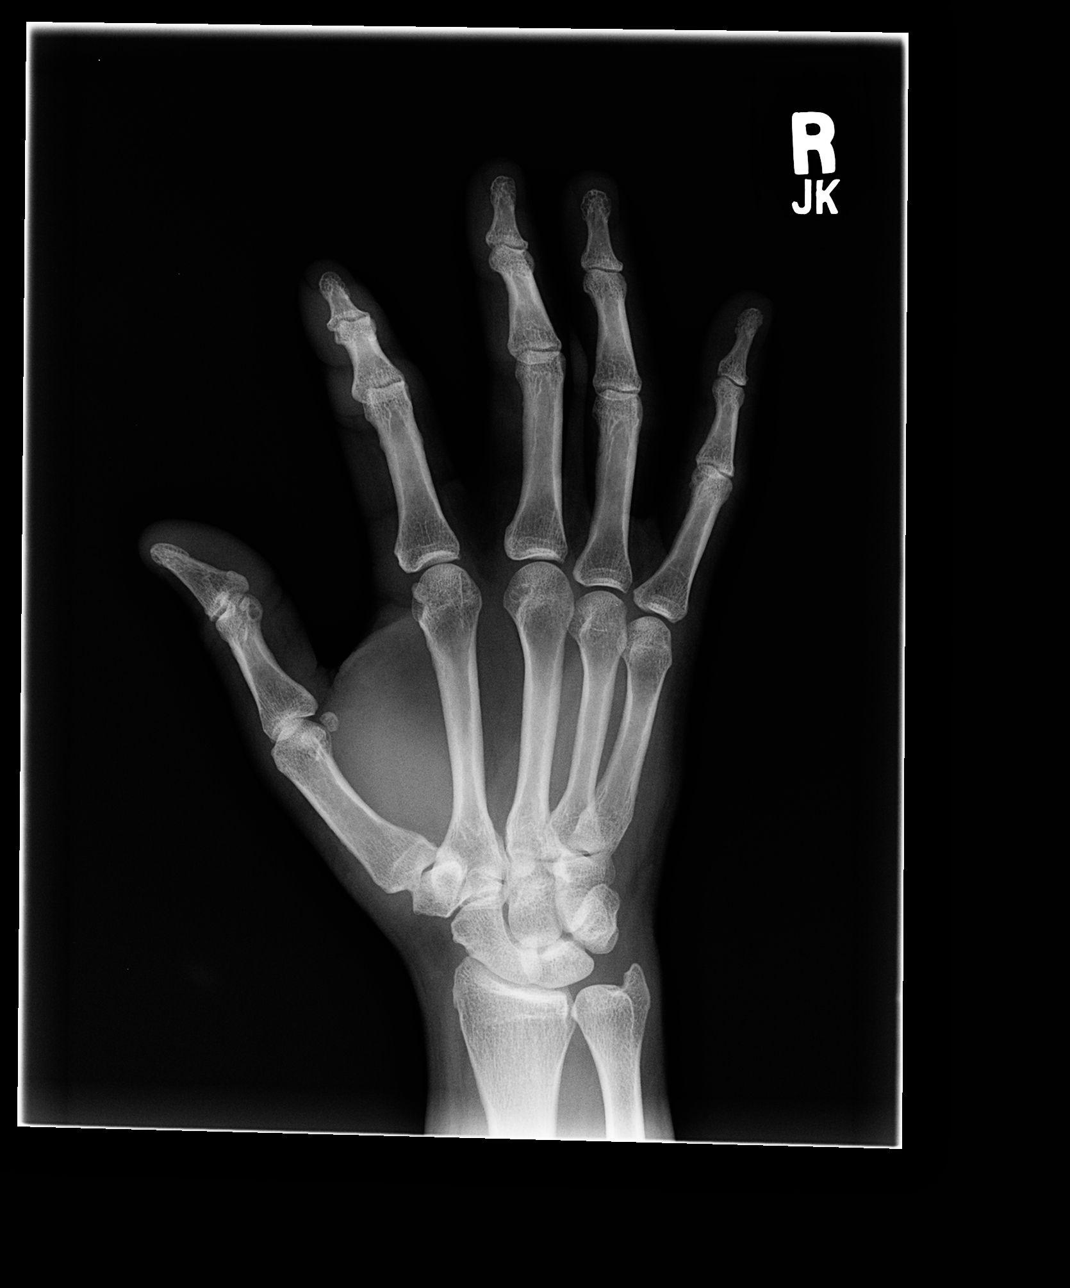

[view not recorded (3 of 3)]
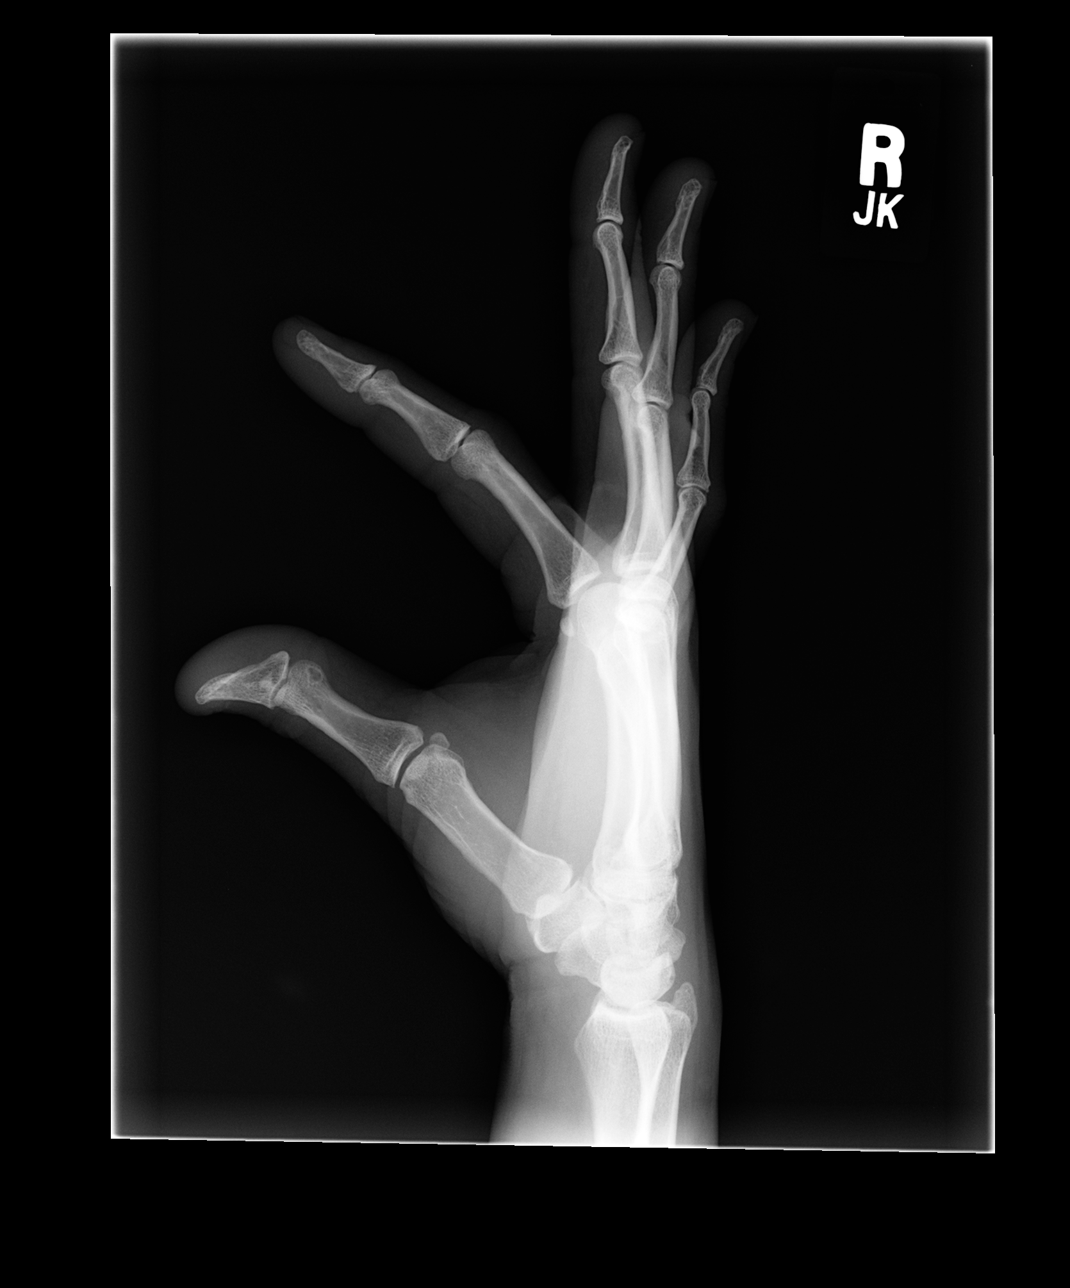

[3 of 3 positions shown; findings below may reference images not displayed]

FINDINGS: No evidence of acute, subacute or healed fractures. Circumscribed
lucent lesion with sclerotic margin in the head of the proximal
phalanx of the thumb, unchanged. Mild joint space narrowing
involving the IP joint of the home, unchanged. No new intrinsic
osseous abnormalities. Joint spaces well preserved elsewhere. Bone
mineral density well-preserved.
IMPRESSION: 1. No acute or subacute osseous abnormality.
2. Mild osteoarthritis involving the IP joint of the on.
3. Benign cystic focus in the head of the proximal phalanx of the
thumb, unchanged since [DATE].

## 2016-05-12 ENCOUNTER — Ambulatory Visit (INDEPENDENT_AMBULATORY_CARE_PROVIDER_SITE_OTHER): Payer: Managed Care, Other (non HMO)

## 2016-05-12 ENCOUNTER — Ambulatory Visit (INDEPENDENT_AMBULATORY_CARE_PROVIDER_SITE_OTHER): Payer: Managed Care, Other (non HMO) | Admitting: Family Medicine

## 2016-05-12 VITALS — BP 122/84 | HR 81 | Temp 97.5°F | Resp 16 | Ht 65.0 in | Wt 156.0 lb

## 2016-05-12 DIAGNOSIS — R1084 Generalized abdominal pain: Secondary | ICD-10-CM | POA: Diagnosis not present

## 2016-05-12 DIAGNOSIS — K5900 Constipation, unspecified: Secondary | ICD-10-CM

## 2016-05-12 DIAGNOSIS — S8002XA Contusion of left knee, initial encounter: Secondary | ICD-10-CM

## 2016-05-12 DIAGNOSIS — R140 Abdominal distension (gaseous): Secondary | ICD-10-CM

## 2016-05-12 DIAGNOSIS — M25562 Pain in left knee: Secondary | ICD-10-CM

## 2016-05-12 LAB — POCT CBC
Granulocyte percent: 33.7 %G — AB (ref 37–80)
HEMATOCRIT: 41.1 % — AB (ref 43.5–53.7)
Hemoglobin: 14.5 g/dL (ref 14.1–18.1)
LYMPH, POC: 3.7 — AB (ref 0.6–3.4)
MCH: 29.7 pg (ref 27–31.2)
MCHC: 35.2 g/dL (ref 31.8–35.4)
MCV: 84.4 fL (ref 80–97)
MID (CBC): 0.5 (ref 0–0.9)
MPV: 6.6 fL (ref 0–99.8)
POC Granulocyte: 2.1 (ref 2–6.9)
POC LYMPH PERCENT: 7.3 %L — AB (ref 10–50)
POC MID %: 59 % — AB (ref 0–12)
Platelet Count, POC: 217 10*3/uL (ref 142–424)
RBC: 4.87 M/uL (ref 4.69–6.13)
RDW, POC: 13.1 %
WBC: 6.2 10*3/uL (ref 4.6–10.2)

## 2016-05-12 NOTE — Progress Notes (Signed)
Patient ID: [name], male    DOB: Aug 29, 1961  Age: 55 y.o. MRN: [id number]  Chief Complaint  Patient presents with  . Constipation  . Leg Injury    left    Subjective:   55 year old man who has been having a couple of problems. May 12 he had to have a hemorrhoidectomy and rectal repair. He has done okay from that. He is been taking fiber and o'clock 6 postop. He has not been on that for a while. He has however this week had constipation. He feels bloated. He only moves a small amount we attempted to move his bowels frequently. He passes a lot of gas. He has some nausea. He has discomfort from the bloating but not major pain in the abdomen. He also has been having problems with pain in his left knee. About 53 days ago he tripped taking out the garbage and fell injuring his left knee. He had an abrasion of the knee and use some ointment on it. It is healed over but his stay very tender and painful. He points to the area of the patellar tendon, and wonders whether he has infection or something going on in there from the injury.  Current allergies, medications, problem list, past/family and social histories reviewed.  Objective:  BP 122/84 mmHg  Pulse 81  Temp(Src) 97.5 F (36.4 C) (Oral)  Resp 16  Ht 5\' 5"  (1.651 m)  Wt 156 lb (70.761 kg)  BMI 25.96 kg/m2  SpO2 99%  Generally healthy-appearing young man. His abdomen has bowel sounds present. Nontender negative. Soft without organomegaly or masses but has some mild generalized discomfort. His left knee appears normal. He has a little scarring just along the area of the patellar tendon where he had an abrasion. No abscess formation to me palpated. There is no erythema or warmth. No calf or thigh tenderness.  Assessment & Plan:   Assessment: No diagnosis found.    Plan: Constipation and abdominal pain will be evaluated with labs and an x-ray. We'll evaluate the knee with x-rays. Then decide where we proceed from there.  No orders  of the defined types were placed in this encounter.    No orders of the defined types were placed in this encounter.   Results for orders placed or performed in visit on 05/12/16  POCT CBC  Result Value Ref Range   WBC 6.2 4.6 - 10.2 K/uL   Lymph, poc 3.7 (A) 0.6 - 3.4   POC LYMPH PERCENT 7.3 (A) 10 - 50 %L   MID (cbc) 0.5 0 - 0.9   POC MID % 59.0 (A) 0 - 12 %M   POC Granulocyte 2.1 2 - 6.9   Granulocyte percent 33.7 (A) 37 - 80 %G   RBC 4.87 4.69 - 6.13 M/uL   Hemoglobin 14.5 14.1 - 18.1 g/dL   HCT, POC 41.1 (A) 43.5 - 53.7 %   MCV 84.4 80 - 97 fL   MCH, POC 29.7 27 - 31.2 pg   MCHC 35.2 31.8 - 35.4 g/dL   RDW, POC 13.1 %   Platelet Count, POC 217 142 - 424 K/uL   MPV 6.6 0 - 99.8 fL   X-ray shows a large stool burden in the colon consistent with constipation. No free air or ileus appearance. Radiology reading is still pending.  The knee x-ray is normal. Radiology reading is also pending on it.      Patient Instructions       IF you received  an x-ray today, you will receive an invoice from [facility]. Please contact [facility] at [phone] with questions or concerns regarding your invoice.   IF you received labwork today, you will receive an invoice from [employer]. Please contact [facility] at [phone] with questions or concerns regarding your invoice.   Our billing staff will not be able to assist you with questions regarding bills from these companies.  You will be contacted with the lab results as soon as they are available. The fastest way to get your results is to activate your My Chart account. Instructions are located on the last page of this paperwork. If you have not heard from [place] regarding the results in 2 weeks, please contact this office.         No Follow-up on file.   HOPPER,DAVID, MD 05/12/2016

## 2016-05-12 NOTE — Patient Instructions (Addendum)
  Continue taking your fiber (Metamucil type).  Take MiraLAX once or twice daily until the stools are loose. Continue taking it 1/2-1 dose daily until the bowels have continued moving regularly for a week or 10 days. Then decrease the MiraLAX to every other day. Continue this for another month or so and then decrease to using it on an as-needed basis only. This is to try and train your bowels to work on a regular basis again.  Continue taking Colace 1 dose daily for stool softener as directed on the container.  In the event of abdominal pain, persistent bloating, or passing of any blood please return.  The knee appears to be good on x-ray and exam and I believe you have just bruised. Patellar tendon and that it will get better with a little more time.  Return at anytime if needed   IF you received an x-ray today, you will receive an invoice from [facility]. Please contact [facility] at [phone] with questions or concerns regarding your invoice.   IF you received labwork today, you will receive an invoice from [employer]. Please contact [facility] at [phone] with questions or concerns regarding your invoice.   Our billing staff will not be able to assist you with questions regarding bills from these companies.  You will be contacted with the lab results as soon as they are available. The fastest way to get your results is to activate your My Chart account. Instructions are located on the last page of this paperwork. If you have not heard from [place] regarding the results in 2 weeks, please contact this office.

## 2016-05-13 LAB — COMPREHENSIVE METABOLIC PANEL
ALBUMIN: 4 g/dL (ref 3.6–5.1)
ALK PHOS: 97 U/L (ref 40–115)
ALT: 39 U/L (ref 9–46)
AST: 29 U/L (ref 10–35)
BILIRUBIN TOTAL: 0.4 mg/dL (ref 0.2–1.2)
BUN: 10 mg/dL (ref 7–25)
CALCIUM: 8.9 mg/dL (ref 8.6–10.3)
CO2: 24 mmol/L (ref 20–31)
Chloride: 105 mmol/L (ref 98–110)
Creat: 0.96 mg/dL (ref 0.70–1.33)
GLUCOSE: 91 mg/dL (ref 65–99)
POTASSIUM: 3.9 mmol/L (ref 3.5–5.3)
Sodium: 140 mmol/L (ref 135–146)
TOTAL PROTEIN: 6.6 g/dL (ref 6.1–8.1)

## 2016-05-19 ENCOUNTER — Encounter: Payer: Self-pay | Admitting: Endocrinology

## 2016-05-19 ENCOUNTER — Ambulatory Visit (INDEPENDENT_AMBULATORY_CARE_PROVIDER_SITE_OTHER): Payer: Managed Care, Other (non HMO) | Admitting: Endocrinology

## 2016-05-19 VITALS — BP 119/76 | HR 76 | Temp 97.7°F | Ht 65.0 in | Wt 155.0 lb

## 2016-05-19 DIAGNOSIS — Z0000 Encounter for general adult medical examination without abnormal findings: Secondary | ICD-10-CM

## 2016-05-19 LAB — LIPID PANEL
Cholesterol: 156 mg/dL (ref 0–200)
HDL: 75.4 mg/dL (ref >39.00)
NonHDL: 80.87
TRIGLYCERIDES: 265 mg/dL — AB (ref 0.0–149.0)
Total CHOL/HDL Ratio: 2
VLDL: 53 mg/dL — ABNORMAL HIGH (ref 0.0–40.0)

## 2016-05-19 LAB — TSH: TSH: 2.48 u[IU]/mL (ref 0.35–4.50)

## 2016-05-19 LAB — LDL CHOLESTEROL, DIRECT: Direct LDL: 50 mg/dL

## 2016-05-19 MED ORDER — SILDENAFIL CITRATE 20 MG PO TABS: 20.0000 mg | ORAL_TABLET | Freq: Every day | ORAL | 11 refills | Status: DC | PRN

## 2016-05-19 NOTE — Progress Notes (Signed)
Subjective:    Patient ID: [name], male    DOB: 1961-03-24, 55 y.o.   MRN: [id number]  HPI Pt is here for regular wellness examination, and is feeling pretty well in general, and says chronic med probs are stable, except as noted below Past Medical History:  Diagnosis Date  . Anxiety   . ARM PAIN, LEFT 11/20/2009  . Arthritis   . BIPOLAR DISORDER UNSPECIFIED 12/13/2007   patient denies  . Blood transfusion without reported diagnosis   . CHEST PAIN 03/23/2009   currently having   . DEPRESSION 12/13/2007  . FATIGUE 03/23/2009  . GERD (gastroesophageal reflux disease)   . GLAUCOMA 09/03/2009  . Headache   . HEMORRHOIDS 09/03/2009  . HOARSENESS 09/03/2009  . HYPERCHOLESTEROLEMIA 03/11/2010  . MYALGIA 08/13/2009  . NECK PAIN 01/18/2009  . Palpitations 03/23/2009  . Polydipsia 01/24/2008  . Shortness of breath 03/23/2009  . THYROID NODULE 08/28/2008   benign on Bx  . THYROIDITIS 12/13/2007    Past Surgical History:  Procedure Laterality Date  . BACK SURGERY     x 3, lumbar  . EYE SURGERY     2008  . FINGER SURGERY    . TRANSANAL HEMORRHOIDAL DEARTERIALIZATION N/A 03/07/2016   Procedure: TRANSANAL HEMORRHOIDAL DEARTERIALIZATION HEMORRHOIDAL LIGATION/PEXY EXAM UNDER ANESTHESIA WITH  POSSIBLE HEMORRHOIDECTOMY ;  Surgeon: Michael Boston, MD;  Location: [facility];  Service: General;  Laterality: N/A;    Social History   Social History  . Marital status: Divorced    Spouse name: N/A  . Number of children: 1  . Years of education: N/A   Occupational History  . [employer]    Social History Main Topics  . Smoking status: Never Smoker  . Smokeless tobacco: Never Used  . Alcohol use No  . Drug use: No  . Sexual activity: Not on file   Other Topics Concern  . Not on file   Social History Narrative   [name] lives with his wife in [place], 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       He is originally form [place]    Current Outpatient Prescriptions on File  Prior to Visit  Medication Sig Dispense Refill  . atorvastatin (LIPITOR) 40 MG tablet TAKE 1 TABLET (40 MG TOTAL) BY MOUTH DAILY. 90 tablet 3  . buPROPion (WELLBUTRIN XL) 150 MG 24 hr tablet Take 450 mg by mouth daily.     Marland Kitchen ibuprofen (ADVIL,MOTRIN) 200 MG tablet Take 200 mg by mouth every 6 (six) hours as needed (For pain.).    Marland Kitchen mirtazapine (REMERON) 30 MG tablet Take 30 mg by mouth at bedtime.     . Multiple Vitamin (MULTIVITAMIN) tablet Take 1 tablet by mouth daily.      Marland Kitchen omeprazole (PRILOSEC) 40 MG capsule Take 1 capsule (40 mg total) by mouth daily. 30 capsule 11  . Travoprost, BAK Free, (TRAVATAN) 0.004 % SOLN ophthalmic solution Place 1 drop into both eyes at bedtime.    . meloxicam (MOBIC) 15 MG tablet Take 1 tablet (15 mg total) by mouth daily. (Patient not taking: Reported on 05/12/2016) 30 tablet 0  . oxyCODONE (OXY IR/ROXICODONE) 5 MG immediate release tablet Take 1-2 tablets (5-10 mg total) by mouth every 4 (four) hours as needed for moderate pain, severe pain or breakthrough pain. (Patient not taking: Reported on 05/12/2016) 40 tablet 0   No current facility-administered medications on file prior to visit.     No Known Allergies  Family History  Problem Relation Age of Onset  . Hypertension Other   . Cancer Father 68    colon  . Colon cancer Father   . Dementia Mother     BP 119/76   Pulse 76   Temp 97.7 F (36.5 C) (Oral)   Ht 5\' 5"  (1.651 m)   Wt 155 lb (70.3 kg)   SpO2 95%   BMI 25.79 kg/m     Review of Systems  Constitutional: Negative for fever and unexpected weight change.  HENT: Negative for hearing loss.   Eyes: Negative for visual disturbance.  Respiratory: Negative for shortness of breath.   Cardiovascular: Negative for chest pain.  Gastrointestinal: Negative for anal bleeding.  Endocrine: Negative for cold intolerance.  Genitourinary: Negative for difficulty urinating and hematuria.  Musculoskeletal: Negative for back pain.  Skin: Negative for  rash.  Allergic/Immunologic: Negative for environmental allergies.  Neurological: Negative for syncope and headaches.  Hematological: Does not bruise/bleed easily.  Psychiatric/Behavioral: Negative for suicidal ideas.       Objective:   Physical Exam VS: see vs page GEN: no distress HEAD: head: no deformity eyes: no periorbital swelling, no proptosis external nose and ears are normal mouth: no lesion seen NECK: supple, thyroid is not enlarged CHEST WALL: no deformity LUNGS: clear to auscultation BREASTS:  No gynecomastia CV: reg rate and rhythm, no murmur ABD: abdomen is soft, nontender.  no hepatosplenomegaly.  not distended.  no hernia RECTAL: normal external and internal exam.  heme neg. PROSTATE:  Normal size.  No nodule MUSCULOSKELETAL: muscle bulk and strength are grossly normal.  no obvious joint swelling.  gait is normal and steady.  Old healed surgical scar at the lower back EXTEMITIES: no deformity.  no ulcer on the feet.  feet are of normal color and temp.  no edema PULSES: dorsalis pedis intact bilat.  no carotid bruit NEURO:  cn 2-12 grossly intact.   readily moves all 4's.  sensation is intact to touch on the feet SKIN:  Normal texture and temperature.  No rash or suspicious lesion is visible.   NODES:  None palpable at the neck PSYCH: alert, well-oriented.  Does not appear anxious nor depressed.       Assessment & Plan:  Wellness visit today, with problems stable, except as noted.  Patient is advised the following: Patient Instructions  Please consider these measures for your health:  minimize alcohol.  Do not use tobacco products.  Have a colonoscopy at least every 10 years from age 85.  Keep firearms safely stored.  Always use seat belts.  have working smoke alarms in your home.  See an eye doctor and dentist regularly.  Never drive under the influence of alcohol or drugs (including prescription drugs).  blood tests are requested for you today.  We'll let you  know about the results. Renato Shin, MD

## 2016-05-19 NOTE — Progress Notes (Signed)
we discussed code status.  pt requests full code. 

## 2016-05-19 NOTE — Patient Instructions (Addendum)
Please consider these measures for your health:  minimize alcohol.  Do not use tobacco products.  Have a colonoscopy at least every 10 years from age 55.  Keep firearms safely stored.  Always use seat belts.  have working smoke alarms in your home.  See an eye doctor and dentist regularly.  Never drive under the influence of alcohol or drugs (including prescription drugs).  blood tests are requested for you today.  We'll let you know about the results.

## 2016-06-25 ENCOUNTER — Ambulatory Visit (INDEPENDENT_AMBULATORY_CARE_PROVIDER_SITE_OTHER): Payer: Managed Care, Other (non HMO)

## 2016-06-25 ENCOUNTER — Ambulatory Visit (INDEPENDENT_AMBULATORY_CARE_PROVIDER_SITE_OTHER): Payer: Managed Care, Other (non HMO) | Admitting: Emergency Medicine

## 2016-06-25 VITALS — BP 128/88 | HR 87 | Temp 97.8°F | Resp 18 | Ht 65.0 in | Wt 157.2 lb

## 2016-06-25 DIAGNOSIS — R1084 Generalized abdominal pain: Secondary | ICD-10-CM | POA: Diagnosis not present

## 2016-06-25 DIAGNOSIS — R140 Abdominal distension (gaseous): Secondary | ICD-10-CM

## 2016-06-25 DIAGNOSIS — M542 Cervicalgia: Secondary | ICD-10-CM | POA: Diagnosis not present

## 2016-06-25 DIAGNOSIS — M5010 Cervical disc disorder with radiculopathy, unspecified cervical region: Secondary | ICD-10-CM | POA: Diagnosis not present

## 2016-06-25 MED ORDER — GABAPENTIN 100 MG PO CAPS: ORAL_CAPSULE | ORAL | 3 refills | Status: DC

## 2016-06-25 NOTE — Patient Instructions (Addendum)
   IF you received an x-ray today, you will receive an invoice from [facility]. Please contact [facility] at [phone] with questions or concerns regarding your invoice.   IF you received labwork today, you will receive an invoice from [facility]. Please contact [facility] at [phone] with questions or concerns regarding your invoice.   Our billing staff will not be able to assist you with questions regarding bills from these companies.  You will be contacted with the lab results as soon as they are available. The fastest way to get your results is to activate your My Chart account. Instructions are located on the last page of this paperwork. If you have not heard from us regarding the results in 2 weeks, please contact this office.     Cervical Radiculopathy Cervical radiculopathy happens when a nerve in the neck (cervical nerve) is pinched or bruised. This condition can develop because of an injury or as part of the normal aging process. Pressure on the cervical nerves can cause pain or numbness that runs from the neck all the way down into the arm and fingers. Usually, this condition gets better with rest. Treatment may be needed if the condition does not improve.  CAUSES This condition may be caused by:  Injury.  Slipped (herniated) disk.  Muscle tightness in the neck because of overuse.  Arthritis.  Breakdown or degeneration in the bones and joints of the spine (spondylosis) due to aging.  Bone spurs that may develop near the cervical nerves. SYMPTOMS Symptoms of this condition include:  Pain that runs from the neck to the arm and hand. The pain can be severe or irritating. It may be worse when the neck is moved.  Numbness or weakness in the affected arm and hand. DIAGNOSIS This condition may be diagnosed based on symptoms, medical history, and a physical exam. You may also have tests, including:  X-rays.  CT  scan.  MRI.  Electromyogram (EMG).  Nerve conduction tests. TREATMENT In many cases, treatment is not needed for this condition. With rest, the condition usually gets better over time. If treatment is needed, options may include:  Wearing a soft neck collar for short periods of time.  Physical therapy to strengthen your neck muscles.  Medicines, such as NSAIDs, oral corticosteroids, or spinal injections.  Surgery. This may be needed if other treatments do not help. Various types of surgery may be done depending on the cause of your problems. HOME CARE INSTRUCTIONS Managing Pain  Take over-the-counter and prescription medicines only as told by your health care provider.  If directed, apply ice to the affected area.  Put ice in a plastic bag.  Place a towel between your skin and the bag.  Leave the ice on for 20 minutes, 2-3 times per day.  If ice does not help, you can try using heat. Take a warm shower or warm bath, or use a heat pack as told by your health care provider.  Try a gentle neck and shoulder massage to help relieve symptoms. Activity  Rest as needed. Follow instructions from your health care provider about any restrictions on activities.  Do stretching and strengthening exercises as told by your health care provider or physical therapist. General Instructions  If you were given a soft collar, wear it as told by your health care provider.  Use a flat pillow when you sleep.  Keep all follow-up visits as told by your health care provider. This is important. SEEK   MEDICAL CARE IF:  Your condition does not improve with treatment. SEEK IMMEDIATE MEDICAL CARE IF:  Your pain gets much worse and cannot be controlled with medicines.  You have weakness or numbness in your hand, arm, face, or leg.  You have a high fever.  You have a stiff, rigid neck.  You lose control of your bowels or your bladder (have incontinence).  You have trouble with walking,  balance, or speaking.   This information is not intended to replace advice given to you by your health care provider. Make sure you discuss any questions you have with your health care provider.   Document Released: 07/08/2001 Document Revised: 07/04/2015 Document Reviewed: 12/07/2014 Elsevier Interactive Patient Education 2016 Elsevier Inc.  

## 2016-06-25 NOTE — Progress Notes (Signed)
Subjective:  This chart was scribed for [name] MD, by Tamsen Roers, at [facility].  This patient was seen in room 1 and the patient's care was started at 9:42 AM.   Chief Complaint  Patient presents with  . Constipation    still having problems with bloating   . Arm Pain    pt. fell yesterday and is having pain in his left arm and shoulder      Patient ID: [name], male    DOB: 01/27/1961, 55 y.o.   MRN: [id number]  HPI HPI Comments: [name] is a 55 y.o. male who presents to the [facility] with arm/neck/shoulder pain and constipation.   Neck, shoulder and left arm pain: Yesterday,patient fell while he was mopping and hurt his left arm, neck and left shoulder.  He is having difficulty lifting his arm today.  Patient works at [employer] and was not able to work yesterday due to the pain so he left work early. He has a history of neck pain and had x-rays done on March 2015 which showed multi-level degenerative disc disease   Constipation: Patient was here in July complaining of bloating and states that he still has this symptom along with constipation. He is rarely able to have a bowel movement.     Patient Active Problem List   Diagnosis Date Noted  . Prolapsed internal hemorrhoids, grade 3, s/p Lares ligation/pexy 03/07/2016 03/07/2016  . Memory loss 08/16/2015  . Depression 08/16/2015  . Erectile dysfunction 04/25/2015  . Pain of right thumb 01/08/2015  . Screening for cancer 01/08/2015  . Polyuria 01/08/2015  . Routine general medical examination at a health care facility 01/09/2014  . Screening examination for infectious disease 06/14/2013  . Contusion of elbow, right 10/04/2012  . Anal fissure 09/06/2012  . Dysuria 06/15/2012  . Encounter for long-term (current) use of other medications 10/13/2011  . Arthralgia 10/13/2011  . Screening for prostate cancer 10/13/2011  . Disorder of liver  10/13/2011  . HYPERCHOLESTEROLEMIA 03/11/2010  . ARM PAIN, LEFT 11/20/2009  . GLAUCOMA 09/03/2009  . Internal hemorrhoids with pain 09/03/2009  . HOARSENESS 09/03/2009  . MYALGIA 08/13/2009  . FATIGUE 03/23/2009  . PALPITATIONS 03/23/2009  . SHORTNESS OF BREATH 03/23/2009  . Chest pain, atypical 03/23/2009  . NECK PAIN 01/18/2009  . THYROID NODULE 08/28/2008  . POLYDIPSIA 01/24/2008  . Thyroiditis 12/13/2007  . BIPOLAR DISORDER UNSPECIFIED 12/13/2007  . DEPRESSION 12/13/2007   Past Medical History:  Diagnosis Date  . Anxiety   . ARM PAIN, LEFT 11/20/2009  . Arthritis   . BIPOLAR DISORDER UNSPECIFIED 12/13/2007   patient denies  . Blood transfusion without reported diagnosis   . CHEST PAIN 03/23/2009   currently having   . DEPRESSION 12/13/2007  . FATIGUE 03/23/2009  . GERD (gastroesophageal reflux disease)   . GLAUCOMA 09/03/2009  . Headache   . HEMORRHOIDS 09/03/2009  . HOARSENESS 09/03/2009  . HYPERCHOLESTEROLEMIA 03/11/2010  . MYALGIA 08/13/2009  . NECK PAIN 01/18/2009  . Palpitations 03/23/2009  . Polydipsia 01/24/2008  . Shortness of breath 03/23/2009  . THYROID NODULE 08/28/2008   benign on Bx  . THYROIDITIS 12/13/2007   Past Surgical History:  Procedure Laterality Date  . BACK SURGERY     x 3, lumbar  . EYE SURGERY     2008  . FINGER SURGERY    . TRANSANAL HEMORRHOIDAL DEARTERIALIZATION N/A 03/07/2016   Procedure: TRANSANAL HEMORRHOIDAL DEARTERIALIZATION HEMORRHOIDAL LIGATION/PEXY  EXAM UNDER ANESTHESIA WITH  POSSIBLE HEMORRHOIDECTOMY ;  Surgeon: Michael Boston, MD;  Location: [facility];  Service: General;  Laterality: N/A;   No Known Allergies Prior to Admission medications   Medication Sig Start Date End Date Taking? Authorizing Provider  atorvastatin (LIPITOR) 40 MG tablet TAKE 1 TABLET (40 MG TOTAL) BY MOUTH DAILY. 12/03/15  Yes Renato Shin, MD  buPROPion (WELLBUTRIN XL) 150 MG 24 hr tablet Take 450 mg by mouth daily.    Yes Historical Provider, MD  docusate sodium  (COLACE) 100 MG capsule Take 100 mg by mouth 2 (two) times daily.   Yes Historical Provider, MD  ibuprofen (ADVIL,MOTRIN) 200 MG tablet Take 200 mg by mouth every 6 (six) hours as needed (For pain.).   Yes Historical Provider, MD  mirtazapine (REMERON) 30 MG tablet Take 30 mg by mouth at bedtime.  04/17/15  Yes Historical Provider, MD  Multiple Vitamin (MULTIVITAMIN) tablet Take 1 tablet by mouth daily.     Yes Historical Provider, MD  omeprazole (PRILOSEC) 40 MG capsule Take 1 capsule (40 mg total) by mouth daily. 04/25/15  Yes Renato Shin, MD  sildenafil (REVATIO) 20 MG tablet Take 1-5 tablets (20-100 mg total) by mouth daily as needed (For erectile dysfunction.). 05/19/16  Yes Renato Shin, MD  Travoprost, BAK Free, (TRAVATAN) 0.004 % SOLN ophthalmic solution Place 1 drop into both eyes at bedtime.   Yes Historical Provider, MD  meloxicam (MOBIC) 15 MG tablet Take 1 tablet (15 mg total) by mouth daily. Patient not taking: Reported on 05/12/2016 07/09/15   Leandrew Koyanagi, MD  oxyCODONE (OXY IR/ROXICODONE) 5 MG immediate release tablet Take 1-2 tablets (5-10 mg total) by mouth every 4 (four) hours as needed for moderate pain, severe pain or breakthrough pain. Patient not taking: Reported on 05/12/2016 03/07/16   Michael Boston, MD   Social History   Social History  . Marital status: Divorced    Spouse name: N/A  . Number of children: 1  . Years of education: N/A   Occupational History  . [employer]    Social History Main Topics  . Smoking status: Never Smoker  . Smokeless tobacco: Never Used  . Alcohol use No  . Drug use: No  . Sexual activity: Not on file   Other Topics Concern  . Not on file   Social History Narrative   [name] lives with his wife in [place], 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       He is originally form [place]      Review of Systems  Constitutional: Negative for chills and fever.  Eyes: Negative for pain, redness and itching.    Gastrointestinal: Positive for constipation. Negative for nausea and vomiting.  Musculoskeletal: Positive for neck pain.  Neurological: Negative for seizures, syncope and speech difficulty.       Objective:   Physical Exam  Vitals:   06/25/16 0914  BP: 128/88  Pulse: 87  Resp: 18  Temp: 97.8 F (36.6 C)  TempSrc: Oral  SpO2: 94%  Weight: 157 lb 3.2 oz (71.3 kg)  Height: 5\' 5"  (1.651 m)    CONSTITUTIONAL: Well developed/well nourished HEAD: Normocephalic/atraumatic EYES: EOMI/PERRL ENMT: Mucous membranes moist NECK: Tender over the left side of the neck, he has pain with turning to the left.  SPINE/BACK:entire spine nontender CV: S1/S2 noted, no murmurs/rubs/gallops noted LUNGS: Lungs are clear to auscultation bilaterally, no apparent distress EXTREMITIES:  He has difficulty with abduction of the left shoulder.  Reflexes are 1+ and he has good grip strength.  SKIN: warm, color normal PSYCH: no abnormalities of mood noted, alert and oriented to situation  Dg Cervical Spine Complete  Result Date: 06/25/2016 CLINICAL DATA:  Cervicalgia EXAM: CERVICAL SPINE - COMPLETE 4+ VIEW COMPARISON:  January 11, 2014 FINDINGS: Frontal, lateral, open-mouth odontoid, and bilateral oblique views were obtained. There is no fracture or spondylolisthesis. Prevertebral soft tissues and predental space regions are normal. There is moderate disc space narrowing at C6-7. There is milder disc space narrowing at C3-4 and C4-5. There is facet hypertrophy with exit foraminal narrowing at C3-4, C4-5, C5-6, C6-7 bilaterally, most notably at C3-4 and C4-5 bilaterally. There is persistent reversal of lordotic curvature. IMPRESSION: Multilevel osteoarthritic change, essentially stable from prior study. No fracture or spondylolisthesis. Reversal of lordotic curvature is most likely due to chronic muscle spasm. Electronically Signed   By: Lowella Grip III M.D.   On: 06/25/2016 10:19     Assessment & Plan:   Patient has neck pain with a cervical radiculopathy. He has significant multilevel degenerative disc disease of his neck. Will schedule an MRI of the C-spine. He has had a lot of abdominal discomfort with bloating. I'm concerned about putting him on a nonsteroidal. I think he would probably do better with a drug like neurontin to help with the radicular symptoms. I have taken him out of work. Will check  MRI of the neck as well as referral back to GI for evaluation of his abdominal discomfort.I personally performed the services described in this documentation, which was scribed in my presence. The recorded information has been reviewed and is accurate. Darlyne Russian, MD

## 2016-07-04 ENCOUNTER — Other Ambulatory Visit: Payer: Self-pay | Admitting: Endocrinology

## 2016-07-13 ENCOUNTER — Ambulatory Visit
Admission: RE | Admit: 2016-07-13 | Discharge: 2016-07-13 | Disposition: A | Discharge status: Home / Self Care | Payer: Managed Care, Other (non HMO) | Source: Ambulatory Visit | Attending: Emergency Medicine | Admitting: Emergency Medicine

## 2016-07-13 DIAGNOSIS — M5010 Cervical disc disorder with radiculopathy, unspecified cervical region: Secondary | ICD-10-CM

## 2016-07-14 ENCOUNTER — Other Ambulatory Visit: Payer: Self-pay | Admitting: Emergency Medicine

## 2016-07-14 DIAGNOSIS — M5010 Cervical disc disorder with radiculopathy, unspecified cervical region: Secondary | ICD-10-CM

## 2016-07-16 ENCOUNTER — Telehealth: Payer: Self-pay | Admitting: Radiology

## 2016-07-16 ENCOUNTER — Encounter: Payer: Self-pay | Admitting: Radiology

## 2016-07-16 NOTE — Telephone Encounter (Signed)
Pt called back. Provided results and informed him of referral

## 2016-07-18 ENCOUNTER — Encounter: Payer: Self-pay | Admitting: Emergency Medicine

## 2016-07-19 ENCOUNTER — Other Ambulatory Visit: Payer: Self-pay | Admitting: Emergency Medicine

## 2016-07-19 DIAGNOSIS — M5010 Cervical disc disorder with radiculopathy, unspecified cervical region: Secondary | ICD-10-CM

## 2016-07-28 ENCOUNTER — Ambulatory Visit (INDEPENDENT_AMBULATORY_CARE_PROVIDER_SITE_OTHER): Payer: Managed Care, Other (non HMO) | Admitting: Endocrinology

## 2016-07-28 ENCOUNTER — Encounter: Payer: Self-pay | Admitting: Endocrinology

## 2016-07-28 ENCOUNTER — Telehealth: Payer: Self-pay | Admitting: Endocrinology

## 2016-07-28 DIAGNOSIS — R140 Abdominal distension (gaseous): Secondary | ICD-10-CM

## 2016-07-28 MED ORDER — HYOSCYAMINE SULFATE 0.125 MG PO TABS: 0.1250 mg | ORAL_TABLET | ORAL | 0 refills | Status: DC | PRN

## 2016-07-28 NOTE — Telephone Encounter (Signed)
ok 

## 2016-07-28 NOTE — Telephone Encounter (Signed)
Kim from [facility] called stated the medication hyoscyamine (LEVSIN, ANASPAZ) 0.125 MG tablet, the only tablets they have are sublingual tabs is that ok. Please advise.

## 2016-07-28 NOTE — Progress Notes (Signed)
Subjective:    Patient ID: [name], male    DOB: 1961-07-22, 55 y.o.   MRN: [id number]  HPI Pt states 2 months of intermittent bloating of the abdomen, but no assoc pain.  He has GI f/u appt on 11/20, but says he cannot wait that long.  He seldom takes advil, colace, and prilosec.   Past Medical History:  Diagnosis Date  . Anxiety   . ARM PAIN, LEFT 11/20/2009  . Arthritis   . BIPOLAR DISORDER UNSPECIFIED 12/13/2007   patient denies  . Blood transfusion without reported diagnosis   . CHEST PAIN 03/23/2009   currently having   . DEPRESSION 12/13/2007  . FATIGUE 03/23/2009  . GERD (gastroesophageal reflux disease)   . GLAUCOMA 09/03/2009  . Headache   . HEMORRHOIDS 09/03/2009  . HOARSENESS 09/03/2009  . HYPERCHOLESTEROLEMIA 03/11/2010  . MYALGIA 08/13/2009  . NECK PAIN 01/18/2009  . Palpitations 03/23/2009  . Polydipsia 01/24/2008  . Shortness of breath 03/23/2009  . THYROID NODULE 08/28/2008   benign on Bx  . THYROIDITIS 12/13/2007    Past Surgical History:  Procedure Laterality Date  . BACK SURGERY     x 3, lumbar  . EYE SURGERY     2008  . FINGER SURGERY    . TRANSANAL HEMORRHOIDAL DEARTERIALIZATION N/A 03/07/2016   Procedure: TRANSANAL HEMORRHOIDAL DEARTERIALIZATION HEMORRHOIDAL LIGATION/PEXY EXAM UNDER ANESTHESIA WITH  POSSIBLE HEMORRHOIDECTOMY ;  Surgeon: Michael Boston, MD;  Location: [facility];  Service: General;  Laterality: N/A;    Social History   Social History  . Marital status: Divorced    Spouse name: N/A  . Number of children: 1  . Years of education: N/A   Occupational History  . [employer]    Social History Main Topics  . Smoking status: Never Smoker  . Smokeless tobacco: Never Used  . Alcohol use No  . Drug use: No  . Sexual activity: Not on file   Other Topics Concern  . Not on file   Social History Narrative   Mr Henshaw lives with his wife in [place], 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       He is originally form [place]      Current Outpatient Prescriptions on File Prior to Visit  Medication Sig Dispense Refill  . atorvastatin (LIPITOR) 40 MG tablet TAKE 1 TABLET (40 MG TOTAL) BY MOUTH DAILY. 90 tablet 3  . buPROPion (WELLBUTRIN XL) 150 MG 24 hr tablet Take 450 mg by mouth daily.     Marland Kitchen docusate sodium (COLACE) 100 MG capsule Take 100 mg by mouth 2 (two) times daily.    Marland Kitchen gabapentin (NEURONTIN) 100 MG capsule Take 1-2 capsules 3 times a day for neck pain 90 capsule 3  . ibuprofen (ADVIL,MOTRIN) 200 MG tablet Take 200 mg by mouth every 6 (six) hours as needed (For pain.).    Marland Kitchen mirtazapine (REMERON) 30 MG tablet Take 30 mg by mouth at bedtime.     . Multiple Vitamin (MULTIVITAMIN) tablet Take 1 tablet by mouth daily.      Marland Kitchen omeprazole (PRILOSEC) 40 MG capsule TAKE 1 CAPSULE (40 MG TOTAL) BY MOUTH DAILY. 30 capsule 10  . sildenafil (REVATIO) 20 MG tablet Take 1-5 tablets (20-100 mg total) by mouth daily as needed (For erectile dysfunction.). 50 tablet 11  . Travoprost, BAK Free, (TRAVATAN) 0.004 % SOLN ophthalmic solution Place 1 drop into both eyes at bedtime.     No current facility-administered medications on file prior  to visit.     No Known Allergies  Family History  Problem Relation Age of Onset  . Hypertension Other   . Cancer Father 62    colon  . Colon cancer Father   . Dementia Mother     BP 122/82   Pulse 80   Ht 5\' 7"  (1.702 m)   Wt 158 lb (71.7 kg)   SpO2 95%   BMI 24.75 kg/m    Review of Systems He has increased flatulence and constipation.      Objective:   Physical Exam VITAL SIGNS:  See vs page.  GENERAL: no distress.  ABDOMEN: abdomen is soft, nontender.  no hepatosplenomegaly.  not distended.  no hernia.    Radiol (2006): abd: constipation.    Lab Results  Component Value Date   ALT 39 05/12/2016   AST 29 05/12/2016   ALKPHOS 97 05/12/2016   BILITOT 0.4 05/12/2016       Assessment & Plan:  abd bloating, new, uncertain etiology.

## 2016-07-28 NOTE — Patient Instructions (Signed)
I have sent a prescription to your pharmacy, to try for your symptoms.   Please keep your appointment with Dr Hilarie Fredrickson.

## 2016-07-28 NOTE — Telephone Encounter (Signed)
See message and please advise, Thanks!  

## 2016-07-29 DIAGNOSIS — R140 Abdominal distension (gaseous): Secondary | ICD-10-CM | POA: Insufficient documentation

## 2016-07-29 NOTE — Telephone Encounter (Signed)
I contacted [facility] and gave verbal ok for the Hyoscyamine 0.125 mg sublingual tabs.

## 2016-08-22 ENCOUNTER — Encounter: Payer: Self-pay | Admitting: Endocrinology

## 2016-08-29 ENCOUNTER — Encounter: Payer: Self-pay | Admitting: *Deleted

## 2016-09-15 ENCOUNTER — Encounter: Payer: Self-pay | Admitting: Internal Medicine

## 2016-09-15 ENCOUNTER — Ambulatory Visit (INDEPENDENT_AMBULATORY_CARE_PROVIDER_SITE_OTHER): Payer: Managed Care, Other (non HMO) | Admitting: Internal Medicine

## 2016-09-15 VITALS — BP 120/82 | HR 88 | Ht 67.0 in | Wt 155.0 lb

## 2016-09-15 DIAGNOSIS — R140 Abdominal distension (gaseous): Secondary | ICD-10-CM

## 2016-09-15 DIAGNOSIS — R194 Change in bowel habit: Secondary | ICD-10-CM

## 2016-09-15 DIAGNOSIS — K589 Irritable bowel syndrome without diarrhea: Secondary | ICD-10-CM

## 2016-09-15 DIAGNOSIS — R195 Other fecal abnormalities: Secondary | ICD-10-CM

## 2016-09-15 MED ORDER — RIFAXIMIN 550 MG PO TABS: 550.0000 mg | ORAL_TABLET | Freq: Three times a day (TID) | ORAL | 0 refills | Status: DC

## 2016-09-15 NOTE — Patient Instructions (Addendum)
Please follow up with Nicoletta Ba, PA-C on 10/13/16 @ 2:30 pm.  We have sent your demographic information and a prescription for Xifaxan to [facility]. This pharmacy is able to get medication approved through insurance and get you the lowest copay possible. If you have not heard from them within 1 week, please call our office at [phone] to let us know.  If you are age 53 or older, your body mass index should be between 23-30. Your Body mass index is 24.28 kg/m. If this is out of the aforementioned range listed, please consider follow up with your Primary Care Provider.  If you are age 43 or younger, your body mass index should be between 19-25. Your Body mass index is 24.28 kg/m. If this is out of the aformentioned range listed, please consider follow up with your Primary Care Provider.

## 2016-09-15 NOTE — Progress Notes (Signed)
Patient ID: [name], male   DOB: 22-Feb-1961, 55 y.o.   MRN: [id number] HPI: [name] is a 55 year old male with a past medical history of adenomatous colon polyps, internal hemorrhoids status post hemorrhoidectomy in May 2017 with Dr. Johney Maine, hyperlipidemia, bipolar disorder, GERD who is seen in consultation at the request of Dr. Everlene Farrier to evaluate change in bowel habits with abdominal bloating. The patient is known to me from screening colonoscopy only which was performed on 10/16/2015. This revealed a 5 mm transverse colon polyp removed with cold snare. The exam was otherwise normal except for internal and external hemorrhoids. This polyp was a tubular adenoma without high-grade dysplasia.  He reports that he had hemorrhoidal surgery on 03/07/2016 and since that time he has had what he calls somewhat of loss of his out-of-control. It is been called constipation but he reports incomplete defecation, bloating and borborygmi. He is reporting loose ineffective bowel movements rather than constipation. He reports going to the bathroom as many as 10-11 times per day but not always passing stool. Stool can be soft to loose. He denies passing pure watery stools. He has not seen blood or melena. He had some fecal smearing or seepage a few months ago but this has resolved. He tried Colace but not in the last 2 weeks because it didn't seem to help. He has not had fever or chills. Appetite has been good. Denies heartburn, dysphagia or odynophagia.  Past Medical History:  Diagnosis Date  . Anxiety   . ARM PAIN, LEFT 11/20/2009  . Arthritis   . BIPOLAR DISORDER UNSPECIFIED 12/13/2007   patient denies  . Blood transfusion without reported diagnosis   . CHEST PAIN 03/23/2009   currently having   . DEPRESSION 12/13/2007  . FATIGUE 03/23/2009  . GERD (gastroesophageal reflux disease)   . GLAUCOMA 09/03/2009  . Headache   . HEMORRHOIDS 09/03/2009  . HOARSENESS 09/03/2009  . HYPERCHOLESTEROLEMIA 03/11/2010  .  MYALGIA 08/13/2009  . NECK PAIN 01/18/2009  . Palpitations 03/23/2009  . Polydipsia 01/24/2008  . Shortness of breath 03/23/2009  . THYROID NODULE 08/28/2008   benign on Bx  . THYROIDITIS 12/13/2007  . Tubular adenoma of colon     Past Surgical History:  Procedure Laterality Date  . BACK SURGERY     x 3, lumbar  . EYE SURGERY     2008  . FINGER SURGERY    . TRANSANAL HEMORRHOIDAL DEARTERIALIZATION N/A 03/07/2016   Procedure: TRANSANAL HEMORRHOIDAL DEARTERIALIZATION HEMORRHOIDAL LIGATION/PEXY EXAM UNDER ANESTHESIA WITH  POSSIBLE HEMORRHOIDECTOMY ;  Surgeon: Michael Boston, MD;  Location: [facility];  Service: General;  Laterality: N/A;    Outpatient Medications Prior to Visit  Medication Sig Dispense Refill  . atorvastatin (LIPITOR) 40 MG tablet TAKE 1 TABLET (40 MG TOTAL) BY MOUTH DAILY. 90 tablet 3  . buPROPion (WELLBUTRIN XL) 150 MG 24 hr tablet Take 450 mg by mouth daily.     Marland Kitchen docusate sodium (COLACE) 100 MG capsule Take 100 mg by mouth 2 (two) times daily.    Marland Kitchen gabapentin (NEURONTIN) 100 MG capsule Take 1-2 capsules 3 times a day for neck pain 90 capsule 3  . hyoscyamine (LEVSIN, ANASPAZ) 0.125 MG tablet Take 1 tablet (0.125 mg total) by mouth every 4 (four) hours as needed. 30 tablet 0  . ibuprofen (ADVIL,MOTRIN) 200 MG tablet Take 200 mg by mouth every 6 (six) hours as needed (For pain.).    Marland Kitchen mirtazapine (REMERON) 30 MG tablet Take 30 mg by mouth at bedtime.     Marland Kitchen  Multiple Vitamin (MULTIVITAMIN) tablet Take 1 tablet by mouth daily.      Marland Kitchen omeprazole (PRILOSEC) 40 MG capsule TAKE 1 CAPSULE (40 MG TOTAL) BY MOUTH DAILY. 30 capsule 10  . sildenafil (REVATIO) 20 MG tablet Take 1-5 tablets (20-100 mg total) by mouth daily as needed (For erectile dysfunction.). 50 tablet 11  . Travoprost, BAK Free, (TRAVATAN) 0.004 % SOLN ophthalmic solution Place 1 drop into both eyes at bedtime.     No facility-administered medications prior to visit.     No Known Allergies  Family History  Problem  Relation Age of Onset  . Cancer Father 65    colon  . Colon cancer Father   . Dementia Mother   . Hypertension Other     Social History  Substance Use Topics  . Smoking status: Never Smoker  . Smokeless tobacco: Never Used  . Alcohol use No    ROS: As per history of present illness, otherwise negative  BP 120/82   Pulse 88   Ht 5\' 7"  (1.702 m)   Wt 155 lb (70.3 kg)   BMI 24.28 kg/m  Constitutional: Well-developed and well-nourished. No distress. HEENT: Normocephalic and atraumatic. Conjunctivae are normal.  No scleral icterus. Neck: Neck supple. Trachea midline. Cardiovascular: Normal rate, regular rhythm and intact distal pulses. No M/R/G Pulmonary/chest: Effort normal and breath sounds normal. No wheezing, rales or rhonchi. Abdominal: Soft, nontender, nondistended. Bowel sounds active throughout. There are no masses palpable. No hepatosplenomegaly. Rectal: Normal external exam, nontender, normal tone, no masses, no palpable stool in vault Extremities: no clubbing, cyanosis, or edema Neurological: Alert and oriented to person place and time. Skin: Skin is warm and dry. No rashes noted. Psychiatric: Normal mood and affect. Behavior is normal.  RELEVANT LABS AND IMAGING: CBC    Component Value Date/Time   WBC 6.2 05/12/2016 1737   WBC 5.0 03/03/2016 1430   RBC 4.87 05/12/2016 1737   RBC 4.95 03/03/2016 1430   HGB 14.5 05/12/2016 1737   HGB 14.3 03/03/2016 1430   HCT 41.1 (A) 05/12/2016 1737   HCT 43.2 03/03/2016 1430   PLT 248 03/03/2016 1430   MCV 84.4 05/12/2016 1737   MCH 29.7 05/12/2016 1737   MCH 28.9 03/03/2016 1430   MCHC 35.2 05/12/2016 1737   MCHC 33.1 03/03/2016 1430   RDW 13.5 03/03/2016 1430   LYMPHSABS 3.4 01/15/2015 1702   MONOABS 0.5 01/15/2015 1702   EOSABS 0.1 01/15/2015 1702   BASOSABS 0.0 01/15/2015 1702    CMP     Component Value Date/Time   NA 140 05/12/2016 1723   K 3.9 05/12/2016 1723   CL 105 05/12/2016 1723   CO2 24 05/12/2016  1723   GLUCOSE 91 05/12/2016 1723   BUN 10 05/12/2016 1723   CREATININE 0.96 05/12/2016 1723   CALCIUM 8.9 05/12/2016 1723   PROT 6.6 05/12/2016 1723   ALBUMIN 4.0 05/12/2016 1723   AST 29 05/12/2016 1723   ALT 39 05/12/2016 1723   ALKPHOS 97 05/12/2016 1723   BILITOT 0.4 05/12/2016 1723   GFRNONAA >60 03/03/2016 1430   GFRAA >60 03/03/2016 1430    ASSESSMENT/PLAN: 55 year old male with a past medical history of adenomatous colon polyps, internal hemorrhoids status post hemorrhoidectomy in May 2017 with Dr. Johney Maine, hyperlipidemia, bipolar disorder, GERD who is seen in consultation at the request of Dr. Everlene Farrier to evaluate change in bowel habits with abdominal bloating.  1. Change in bowel habit/bloating/loose stools -- persistent symptoms over 5-6 months. He may of  had an infectious enteritis or colitis with postinfectious irritable symptoms. He relates initiation of his issues to hemorrhoidal surgery but I can't relate symptoms to surgery alone. His colonoscopy was unremarkable other than the small polyp which was removed. I recommended treatment with rifaximin 550 mg 3 times a day 14 days. This often improves irritable bowel and loose stools and would also treat bacterial overgrowth should this be present. I would like him to return in 3-4 weeks to see and advanced practitioner for follow-up to ensure improvement.    [id number] Loanne Drilling, Md [address] [place], [place] [address]    Dr. Everlene Farrier [facility]

## 2016-09-16 ENCOUNTER — Telehealth: Payer: Self-pay | Admitting: Internal Medicine

## 2016-09-16 NOTE — Telephone Encounter (Signed)
Noted  

## 2016-10-13 ENCOUNTER — Ambulatory Visit (INDEPENDENT_AMBULATORY_CARE_PROVIDER_SITE_OTHER): Payer: Managed Care, Other (non HMO) | Admitting: Physician Assistant

## 2016-10-13 ENCOUNTER — Ambulatory Visit (INDEPENDENT_AMBULATORY_CARE_PROVIDER_SITE_OTHER)
Admission: RE | Admit: 2016-10-13 | Discharge: 2016-10-13 | Disposition: A | Discharge status: Home / Self Care | Payer: Managed Care, Other (non HMO) | Source: Ambulatory Visit | Attending: Physician Assistant | Admitting: Physician Assistant

## 2016-10-13 ENCOUNTER — Encounter: Payer: Self-pay | Admitting: Physician Assistant

## 2016-10-13 VITALS — BP 130/80 | HR 76 | Ht 65.0 in | Wt 158.0 lb

## 2016-10-13 DIAGNOSIS — K5909 Other constipation: Secondary | ICD-10-CM

## 2016-10-13 DIAGNOSIS — R140 Abdominal distension (gaseous): Secondary | ICD-10-CM

## 2016-10-13 NOTE — Progress Notes (Addendum)
Subjective:    Patient ID: [name], male    DOB: [birth year], 55 y.o.   MRN: [id number]  HPI [name] is a 81 year old African-American male known to Dr. Hilarie Fredrickson. He has history of adenomatous colon polyps, internal hemorrhoids and is status post hemorrhoidectomy in May 2017. He was last seen in our office about a month ago with complaints of change in his bowel habits and abdominal bloating. He had undergone colonoscopy in December 2016 with finding of a 54mm sessile polyp at the transverse colon and otherwise negative exam. Path from the polyp consistent with a tubular adenoma. At the time of last office visit it was felt that he may have bacterial overgrowth or a postinfectious IBS. He was complaining of more frequent bowel movements. He was given a course of Xifaxan 550 mg by mouth 3 times a day 14 days. He comes in today for follow-up stating that he does not feel any better. He continues to complain of abdominal bloating and gas and states that he is generally having a bowel movement 8 or 9 times per day but generally only passing small amounts of stool. There is some language barrier but he denies diarrhea and says that she just passes small amounts of oral several times per day. There'Ingram been no melena or hematochezia. He denies any abdominal pain. His appetite has been fine his weight is stable. KUB done in July 2017 showed a large amount of retained stool in the right and transverse colon.  Review of Systems Pertinent positive and negative review of systems were noted in the above HPI section.  All other review of systems was otherwise negative.  Outpatient Encounter Prescriptions as of 10/13/2016  Medication Sig  . atorvastatin (LIPITOR) 40 MG tablet TAKE 1 TABLET (40 MG TOTAL) BY MOUTH DAILY.  Marland Kitchen buPROPion (WELLBUTRIN XL) 150 MG 24 hr tablet Take 450 mg by mouth daily.   Marland Kitchen docusate sodium (COLACE) 100 MG capsule Take 100 mg by mouth 2 (two) times daily.  Marland Kitchen gabapentin (NEURONTIN) 100 MG  capsule Take 1-2 capsules 3 times a day for neck pain  . ibuprofen (ADVIL,MOTRIN) 200 MG tablet Take 200 mg by mouth every 6 (six) hours as needed (For pain.).  Marland Kitchen mirtazapine (REMERON) 30 MG tablet Take 30 mg by mouth at bedtime.   . Multiple Vitamin (MULTIVITAMIN) tablet Take 1 tablet by mouth daily.    Marland Kitchen omeprazole (PRILOSEC) 40 MG capsule TAKE 1 CAPSULE (40 MG TOTAL) BY MOUTH DAILY.  . rifaximin (XIFAXAN) 550 MG TABS tablet Take 1 tablet (550 mg total) by mouth 3 (three) times daily.  . sildenafil (REVATIO) 20 MG tablet Take 1-5 tablets (20-100 mg total) by mouth daily as needed (For erectile dysfunction.).  Marland Kitchen Travoprost, BAK Free, (TRAVATAN) 0.004 % SOLN ophthalmic solution Place 1 drop into both eyes at bedtime.  . [DISCONTINUED] hyoscyamine (LEVSIN, ANASPAZ) 0.125 MG tablet Take 1 tablet (0.125 mg total) by mouth every 4 (four) hours as needed.  . [DISCONTINUED] predniSONE (DELTASONE) 5 MG tablet Take 5 mg by mouth daily with breakfast.   No facility-administered encounter medications on file as of 10/13/2016.    No Known Allergies Patient Active Problem List   Diagnosis Date Noted  . Abdominal bloating 07/29/2016  . Prolapsed internal hemorrhoids, grade 3, Ingram/p [place] ligation/pexy 03/07/2016 03/07/2016  . Depression 08/16/2015  . Erectile dysfunction 04/25/2015  . Screening for cancer 01/08/2015  . Routine general medical examination at a health care facility 01/09/2014  . Screening examination for  infectious disease 06/14/2013  . Anal fissure 09/06/2012  . Dysuria 06/15/2012  . Encounter for long-term (current) use of other medications 10/13/2011  . Screening for prostate cancer 10/13/2011  . 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 HYPERCHOLESTEROLEMIA 03/11/2010  . GLAUCOMA 09/03/2009  . Chest pain, atypical 03/23/2009  . THYROID NODULE 08/28/2008  . Thyroiditis 12/13/2007  . BIPOLAR DISORDER UNSPECIFIED 12/13/2007   Social History   Social History  . Marital status: Divorced     Spouse name: N/A  . Number of children: 1  . Years of education: N/A   Occupational History  . 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 Smoking status: Never Smoker  . Smokeless tobacco: Never Used  . Alcohol use No  . Drug use: No  . Sexual activity: Not on file   Other Topics Concern  . Not on file   Social History Narrative   [name] lives with his wife in [place], 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       He is originally form [place]    Stephen. Ingram family history includes Cancer (age of onset: 87) in his father; Colon cancer in his father; Dementia in his mother; Hypertension in his other.      Objective:    Vitals:   10/13/16 1448  BP: 130/80  Pulse: 76    Physical Exam well-developed African-American male in no acute distress, blood pressure 130/80 pulse 76, BMI 26.2. HEENT; nontraumatic normocephalic EOMI PERRLA sclera anicteric, Cardiovascular ;regular rate and rhythm with S1-S2 no murmur or gallop, Pulmonary; clear bilaterally, Abdomen; soft no focal tenderness no guarding or rebound no palpable mass or hepatosplenomegaly bowel sounds are present, Rectal ;exam not done, Ext;no clubbing cyanosis or edema skin warm and dry, Neuropsych ;mood and affect appropriate       Assessment & Plan:   #31 56 year old African male with 4-5 month history of abdominal bloating,gas and change in bowel habits with passage of frequent small volume stool more consistent with incomplete evacuation. No response to empiric retreatment with Xifaxan for IBS/bacterial overgrowth #2 history of tubular adenomatous colon polyps last colonoscopy December 2016 due for follow-up December 2021  Plan; Will check plain abdominal films today-rule out obstipation. If he has large amount of retained stool we'll plan a bowel purge with MiraLAX and then start a regular laxative regimen with MiraLAX or Amitiza. Further plans pending results of abdominal  films. He will need follow-up with Dr. Hilarie Fredrickson or myself in 4-6 weeks.  [name] Stephen Winkleman PA-C 10/13/2016   Cc: Stephen Shin, MD  Addendum: Reviewed and agree with initial management. Jerene Bears, MD

## 2016-10-13 NOTE — Patient Instructions (Signed)
Please go to the basement level to our basement Radiology department.  You will be getting an x-ray of your abdomen.   We will call you with the results and give you instructions for the Miralax samples we have given you.

## 2016-10-14 ENCOUNTER — Other Ambulatory Visit: Payer: Self-pay

## 2016-10-14 MED ORDER — LINACLOTIDE 145 MCG PO CAPS: 145.0000 ug | ORAL_CAPSULE | Freq: Every day | ORAL | 1 refills | Status: DC

## 2016-10-15 ENCOUNTER — Telehealth: Payer: Self-pay | Admitting: Physician Assistant

## 2016-11-26 NOTE — Telephone Encounter (Signed)
No additional notes needed  

## 2016-12-01 ENCOUNTER — Ambulatory Visit: Payer: Managed Care, Other (non HMO) | Admitting: Endocrinology

## 2016-12-12 ENCOUNTER — Other Ambulatory Visit: Payer: Self-pay | Admitting: Endocrinology

## 2016-12-22 ENCOUNTER — Ambulatory Visit (INDEPENDENT_AMBULATORY_CARE_PROVIDER_SITE_OTHER): Payer: Managed Care, Other (non HMO) | Admitting: Endocrinology

## 2016-12-22 ENCOUNTER — Encounter: Payer: Self-pay | Admitting: Endocrinology

## 2016-12-22 VITALS — BP 122/82 | HR 84 | Ht 68.0 in | Wt 158.0 lb

## 2016-12-22 DIAGNOSIS — K642 Third degree hemorrhoids: Secondary | ICD-10-CM | POA: Diagnosis not present

## 2016-12-22 MED ORDER — ATORVASTATIN CALCIUM 20 MG PO TABS: 20.0000 mg | ORAL_TABLET | Freq: Every day | ORAL | 3 refills | Status: DC

## 2016-12-22 NOTE — Patient Instructions (Addendum)
I have sent a prescription to your pharmacy, to reduce the lipitor. Please go back to see Dr Johney Maine.  you will receive a phone call, about a day and time for an appointment.   Please also see Dr Hilarie Fredrickson next week, as scheduled.

## 2016-12-22 NOTE — Progress Notes (Signed)
Subjective:    Patient ID: [name], male    DOB: 01-03-1961, 56 y.o.   MRN: [id number]  HPI Pt says lipitor is causing constipation.  He has GI appt next week.   Rectal itching persists.  He wants to go back to see Dr Johney Maine. Past Medical History:  Diagnosis Date  . Anxiety   . ARM PAIN, LEFT 11/20/2009  . Arthritis   . BIPOLAR DISORDER UNSPECIFIED 12/13/2007   patient denies  . Blood transfusion without reported diagnosis   . CHEST PAIN 03/23/2009   currently having   . DEPRESSION 12/13/2007  . FATIGUE 03/23/2009  . GERD (gastroesophageal reflux disease)   . GLAUCOMA 09/03/2009  . Headache   . HEMORRHOIDS 09/03/2009  . HOARSENESS 09/03/2009  . HYPERCHOLESTEROLEMIA 03/11/2010  . MYALGIA 08/13/2009  . NECK PAIN 01/18/2009  . Palpitations 03/23/2009  . Polydipsia 01/24/2008  . Shortness of breath 03/23/2009  . THYROID NODULE 08/28/2008   benign on Bx  . THYROIDITIS 12/13/2007  . Tubular adenoma of colon     Past Surgical History:  Procedure Laterality Date  . BACK SURGERY     x 3, lumbar  . EYE SURGERY     2008  . FINGER SURGERY    . TRANSANAL HEMORRHOIDAL DEARTERIALIZATION N/A 03/07/2016   Procedure: TRANSANAL HEMORRHOIDAL DEARTERIALIZATION HEMORRHOIDAL LIGATION/PEXY EXAM UNDER ANESTHESIA WITH  POSSIBLE HEMORRHOIDECTOMY ;  Surgeon: Michael Boston, MD;  Location: [facility];  Service: General;  Laterality: N/A;    Social History   Social History  . Marital status: Divorced    Spouse name: N/A  . Number of children: 1  . Years of education: N/A   Occupational History  . 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 Smoking status: Never Smoker  . Smokeless tobacco: Never Used  . Alcohol use No  . Drug use: No  . Sexual activity: Not on file   Other Topics Concern  . Not on file   Social History Narrative   Mr Nugen lives with his wife in [place], 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       He is originally form [place]     Current Outpatient Prescriptions on File Prior to Visit  Medication Sig Dispense Refill  . buPROPion (WELLBUTRIN XL) 150 MG 24 hr tablet Take 450 mg by mouth daily.     Marland Kitchen docusate sodium (COLACE) 100 MG capsule Take 100 mg by mouth 2 (two) times daily.    Marland Kitchen ibuprofen (ADVIL,MOTRIN) 200 MG tablet Take 200 mg by mouth every 6 (six) hours as needed (For pain.).    Marland Kitchen linaclotide (LINZESS) 145 MCG CAPS capsule Take 1 capsule (145 mcg total) by mouth daily before breakfast. 30 capsule 1  . mirtazapine (REMERON) 30 MG tablet Take 30 mg by mouth at bedtime.     . Multiple Vitamin (MULTIVITAMIN) tablet Take 1 tablet by mouth daily.      Marland Kitchen omeprazole (PRILOSEC) 40 MG capsule TAKE 1 CAPSULE (40 MG TOTAL) BY MOUTH DAILY. 30 capsule 10  . sildenafil (REVATIO) 20 MG tablet Take 1-5 tablets (20-100 mg total) by mouth daily as needed (For erectile dysfunction.). 50 tablet 11  . Travoprost, BAK Free, (TRAVATAN) 0.004 % SOLN ophthalmic solution Place 1 drop into both eyes at bedtime.    . gabapentin (NEURONTIN) 100 MG capsule Take 1-2 capsules 3 times a day for neck pain (Patient not taking: Reported on 12/22/2016) 90 capsule 3  .  rifaximin (XIFAXAN) 550 MG TABS tablet Take 1 tablet (550 mg total) by mouth 3 (three) times daily. (Patient not taking: Reported on 12/22/2016) 42 tablet 0   No current facility-administered medications on file prior to visit.     No Known Allergies  Family History  Problem Relation Age of Onset  . Cancer Father 66    colon  . Colon cancer Father   . Dementia Mother   . Hypertension Other     BP 122/82   Pulse 84   Ht 5\' 8"  (1.727 m)   Wt 158 lb (71.7 kg)   SpO2 98%   BMI 24.02 kg/m   Review of Systems Denies BRBPR.     Objective:   Physical Exam VITAL SIGNS:  See vs page.   GENERAL: no distress.  ABDOMEN: abdomen is soft, nontender.  no hepatosplenomegaly.  not distended.  no hernia.   RECTAL: ext exam is normal.       Assessment & Plan:  Constipation,  perceived due to lipitor.  Pruritis ani, persistent.   Patient is advised the following: Patient Instructions  I have sent a prescription to your pharmacy, to reduce the lipitor. Please go back to see Dr Johney Maine.  you will receive a phone call, about a day and time for an appointment.   Please also see Dr Hilarie Fredrickson next week, as scheduled.

## 2016-12-23 ENCOUNTER — Telehealth: Payer: Self-pay | Admitting: Internal Medicine

## 2016-12-23 MED ORDER — LINACLOTIDE 145 MCG PO CAPS: 145.0000 ug | ORAL_CAPSULE | Freq: Every day | ORAL | 0 refills | Status: DC

## 2016-12-23 NOTE — Telephone Encounter (Signed)
Rx sent 

## 2016-12-29 ENCOUNTER — Encounter: Payer: Self-pay | Admitting: Internal Medicine

## 2016-12-29 ENCOUNTER — Ambulatory Visit (INDEPENDENT_AMBULATORY_CARE_PROVIDER_SITE_OTHER): Payer: Managed Care, Other (non HMO) | Admitting: Internal Medicine

## 2016-12-29 VITALS — BP 100/78 | HR 84 | Ht 65.25 in | Wt 161.4 lb

## 2016-12-29 DIAGNOSIS — K5900 Constipation, unspecified: Secondary | ICD-10-CM | POA: Diagnosis not present

## 2016-12-29 DIAGNOSIS — R140 Abdominal distension (gaseous): Secondary | ICD-10-CM

## 2016-12-29 MED ORDER — LUBIPROSTONE 8 MCG PO CAPS: 8.0000 ug | ORAL_CAPSULE | Freq: Two times a day (BID) | ORAL | 3 refills | Status: DC

## 2016-12-29 NOTE — Patient Instructions (Signed)
Discontinue Linzess.  We have sent the following medications to your pharmacy for you to pick up at your convenience: Amitiza 8 mcg twice daily with food.  Follow up with Dr Hilarie Fredrickson in 3-4 months if your symptoms are no better.  Call our office in 10 days with an update on how you are feeling.  If you are age 54 or older, your body mass index should be between 23-30. Your Body mass index is 26.65 kg/m. If this is out of the aforementioned range listed, please consider follow up with your Primary Care Provider.  If you are age 7 or younger, your body mass index should be between 19-25. Your Body mass index is 26.65 kg/m. If this is out of the aformentioned range listed, please consider follow up with your Primary Care Provider.

## 2016-12-29 NOTE — Progress Notes (Signed)
Subjective:    Patient ID: [name], male    DOB: [birth year], 56 y.o.   MRN: [id number]  HPI [name] is a 56 year old male with a past medical history of colon polyps, internal hemorrhoids status post hemorrhoidectomy in May 2017 with Dr. Johney Maine who is here for follow-up to discuss constipation and abdominal bloating. He was last seen in December 2017 by Nicoletta Ba, PA-C. At that point he was having incomplete evacuation with abdominal bloating all felt secondary to constipation. Previous x-ray had suggested constipation. He was started on Linzess 145 g daily and returns for follow-up. He also completed a course of Xifaxan which did not help bowel habits or bloating.  He reports with Linzess, which she is using daily, he will have a loose often watery bowel movement an hour after taking the dose but then no bowel movement throughout the rest of the day. After this bowel movement he transiently feels relief but then he feels abdominal bloating and constipation throughout the rest of the day. He states he feels that he needs to go to the bathroom but cannot. He denies blood in his stool and melena. He denies abdominal pain. He does feel abdominal bloating throughout the day and passes gas with some relief. He denies upper GI complaint including nausea, vomiting or trouble swallowing. Appetite has remained good. No weight loss.  Review of Systems As per history of present illness, otherwise negative  Current Medications, Allergies, Past Medical History, Past Surgical History, Family History and Social History were reviewed in [employer] record.     Objective:   Physical Exam BP 100/78 (BP Location: Left Arm, Patient Position: Sitting, Cuff Size: Normal)   Pulse 84   Ht 5' 5.25" (1.657 m) Comment: height measured without shoes  Wt 161 lb 6 oz (73.2 kg)   BMI 26.65 kg/m  Constitutional: Well-developed and well-nourished. No distress. HEENT: Normocephalic and  atraumatic.  Conjunctivae are normal.  No scleral icterus. Neck: Neck supple. Trachea midline. Cardiovascular: Normal rate, regular rhythm and intact distal pulses. No M/R/G Pulmonary/chest: Effort normal and breath sounds normal. No wheezing, rales or rhonchi. Abdominal: Soft, nontender, nondistended. Bowel sounds active throughout. There are no masses palpable. No hepatosplenomegaly. Extremities: no clubbing, cyanosis, or edema Neurological: Alert and oriented to person place and time. Skin: Skin is warm and dry. Psychiatric: Normal mood and affect. Behavior is normal.  CBC    Component Value Date/Time   WBC 6.2 05/12/2016 1737   WBC 5.0 03/03/2016 1430   RBC 4.87 05/12/2016 1737   RBC 4.95 03/03/2016 1430   HGB 14.5 05/12/2016 1737   HGB 14.3 03/03/2016 1430   HCT 41.1 (A) 05/12/2016 1737   HCT 43.2 03/03/2016 1430   PLT 248 03/03/2016 1430   MCV 84.4 05/12/2016 1737   MCH 29.7 05/12/2016 1737   MCH 28.9 03/03/2016 1430   MCHC 35.2 05/12/2016 1737   MCHC 33.1 03/03/2016 1430   RDW 13.5 03/03/2016 1430   LYMPHSABS 3.4 01/15/2015 1702   MONOABS 0.5 01/15/2015 1702   EOSABS 0.1 01/15/2015 1702   BASOSABS 0.0 01/15/2015 1702       Assessment & Plan:  56 year old male with a past medical history of colon polyps, internal hemorrhoids status post hemorrhoidectomy in May 2017 with Dr. Johney Maine who is here for follow-up to discuss constipation and abdominal bloating.  1. Constipation/abdominal bloating -- Linzess has resulted in a loose sometimes diarrhea type response once daily but no improvement in bloating or abdominal  symptoms. We discussed how Linzess should cause complete evacuation but not loose or watery stools. For this reason I'm going to switch him to Amitiza 8 g twice a day with food. I asked that he notify me in 10-14 days and if symptoms remain will try increased dose of Amitiza, 24 g twice a day with food. He will discontinue Linzess. No alarm symptoms. He had  colonoscopy just over a year ago. I reminded him to eat a high-fiber diet.  2. History of hemorrhoids -- status post hemorrhoidectomy. He has follow-up with Dr. Johney Maine in about 10 days. No current hemorrhoidal symptoms.  25 minutes spent with the patient today. Greater than 50% was spent in counseling and coordination of care with the patient

## 2017-01-20 ENCOUNTER — Telehealth: Payer: Self-pay | Admitting: Internal Medicine

## 2017-01-21 NOTE — Telephone Encounter (Signed)
Left message for pt to call back  °

## 2017-01-27 NOTE — Telephone Encounter (Signed)
Unable to reach pt, pt did not respond to message.

## 2017-03-30 ENCOUNTER — Encounter: Payer: Managed Care, Other (non HMO) | Admitting: Endocrinology

## 2017-04-13 ENCOUNTER — Telehealth: Payer: Self-pay | Admitting: Internal Medicine

## 2017-04-13 NOTE — Telephone Encounter (Signed)
Increase Amitiza to 24 g twice a day

## 2017-04-13 NOTE — Telephone Encounter (Signed)
Pt states he is still having problems with bloating and constipation. States he does not feel that the Amitiza is working, he is on 87mcg BID. Pt states he will have a BM but only a little bit will come out. Please advise.

## 2017-04-14 NOTE — Telephone Encounter (Signed)
Left message for pt to call back  °

## 2017-04-15 MED ORDER — LUBIPROSTONE 24 MCG PO CAPS: 24.0000 ug | ORAL_CAPSULE | Freq: Two times a day (BID) | ORAL | 3 refills | Status: DC

## 2017-04-15 NOTE — Telephone Encounter (Signed)
Spoke with pt and he is aware. Script sent to pharmacy. 

## 2017-06-09 ENCOUNTER — Telehealth: Payer: Self-pay | Admitting: Internal Medicine

## 2017-06-09 NOTE — Telephone Encounter (Signed)
Pt states he is still having problems with bloating and constipation. Reports when he does have a BM it is only a small amount and also reports the stool is dark. Offered pt an appt with [facility] but pt would like to see Dr. Hilarie Fredrickson. Pt scheduled to see Dr. Hilarie Fredrickson 07/06/17@8 :30am. Pt aware.

## 2017-06-15 ENCOUNTER — Encounter: Payer: Self-pay | Admitting: Endocrinology

## 2017-06-15 ENCOUNTER — Ambulatory Visit (INDEPENDENT_AMBULATORY_CARE_PROVIDER_SITE_OTHER): Payer: Managed Care, Other (non HMO) | Admitting: Endocrinology

## 2017-06-15 VITALS — BP 120/74 | HR 76 | Wt 153.0 lb

## 2017-06-15 DIAGNOSIS — Z125 Encounter for screening for malignant neoplasm of prostate: Secondary | ICD-10-CM

## 2017-06-15 DIAGNOSIS — Z0000 Encounter for general adult medical examination without abnormal findings: Secondary | ICD-10-CM | POA: Diagnosis not present

## 2017-06-15 DIAGNOSIS — R05 Cough: Secondary | ICD-10-CM

## 2017-06-15 DIAGNOSIS — R059 Cough, unspecified: Secondary | ICD-10-CM | POA: Insufficient documentation

## 2017-06-15 DIAGNOSIS — R051 Acute cough: Secondary | ICD-10-CM | POA: Insufficient documentation

## 2017-06-15 MED ORDER — SILDENAFIL CITRATE 100 MG PO TABS: 50.0000 mg | ORAL_TABLET | Freq: Every day | ORAL | 3 refills | Status: DC | PRN

## 2017-06-15 NOTE — Patient Instructions (Addendum)
blood tests, and a chest x-ray, are requested for you today.  We'll let you know about the results. Please consider these measures for your health:  minimize alcohol.  Do not use tobacco products.  Have a colonoscopy at least every 10 years from age 54.  Women should have an annual mammogram from age 73.  Keep firearms safely stored.  Always use seat belts.  have working smoke alarms in your home.  See an eye doctor and dentist regularly.  Never drive under the influence of alcohol or drugs (including prescription drugs).   Please return in 1 year.

## 2017-06-15 NOTE — Progress Notes (Signed)
Subjective:    Patient ID: [name], male    DOB: Nov 18, 1960, 56 y.o.   MRN: [id number]  HPI Pt is here for regular wellness examination, and is feeling pretty well in general, and says chronic med probs are stable, except as noted below Past Medical History:  Diagnosis Date  . Anxiety   . ARM PAIN, LEFT 11/20/2009  . Arthritis   . BIPOLAR DISORDER UNSPECIFIED 12/13/2007   patient denies  . Blood transfusion without reported diagnosis   . CHEST PAIN 03/23/2009   currently having   . DEPRESSION 12/13/2007  . FATIGUE 03/23/2009  . GERD (gastroesophageal reflux disease)   . GLAUCOMA 09/03/2009  . Headache   . HEMORRHOIDS 09/03/2009  . HOARSENESS 09/03/2009  . HYPERCHOLESTEROLEMIA 03/11/2010  . MYALGIA 08/13/2009  . NECK PAIN 01/18/2009  . Palpitations 03/23/2009  . Polydipsia 01/24/2008  . Shortness of breath 03/23/2009  . THYROID NODULE 08/28/2008   benign on Bx  . THYROIDITIS 12/13/2007  . Tubular adenoma of colon     Past Surgical History:  Procedure Laterality Date  . BACK SURGERY     x 3, lumbar  . EYE SURGERY     2008  . FINGER SURGERY    . TRANSANAL HEMORRHOIDAL DEARTERIALIZATION N/A 03/07/2016   Procedure: TRANSANAL HEMORRHOIDAL DEARTERIALIZATION HEMORRHOIDAL LIGATION/PEXY EXAM UNDER ANESTHESIA WITH  POSSIBLE HEMORRHOIDECTOMY ;  Surgeon: Stephen Boston, MD;  Location: [facility];  Service: General;  Laterality: N/A;    Social History   Social History  . Marital status: Divorced    Spouse name: N/A  . Number of children: 1  . Years of education: N/A   Occupational History  . 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 Smoking status: Never Smoker  . Smokeless tobacco: Never Used  . Alcohol use No  . Drug use: No  . Sexual activity: Not on file   Other Topics Concern  . Not on file   Social History Narrative   [name] lives with his wife in [place], 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       He is originally form [place]      Current Outpatient Prescriptions on File Prior to Visit  Medication Sig Dispense Refill  . atorvastatin (LIPITOR) 20 MG tablet Take 1 tablet (20 mg total) by mouth daily. 90 tablet 3  . buPROPion (WELLBUTRIN XL) 150 MG 24 hr tablet Take 450 mg by mouth daily.     Marland Kitchen ibuprofen (ADVIL,MOTRIN) 200 MG tablet Take 200 mg by mouth every 6 (six) hours as needed (For pain.).    Marland Kitchen lubiprostone (AMITIZA) 24 MCG capsule Take 1 capsule (24 mcg total) by mouth 2 (two) times daily with a meal. 60 capsule 3  . Multiple Vitamin (MULTIVITAMIN) tablet Take 1 tablet by mouth daily.      Marland Kitchen omeprazole (PRILOSEC) 40 MG capsule TAKE 1 CAPSULE (40 MG TOTAL) BY MOUTH DAILY. 30 capsule 10  . Travoprost, BAK Free, (TRAVATAN) 0.004 % SOLN ophthalmic solution Place 1 drop into both eyes at bedtime.     No current facility-administered medications on file prior to visit.     No Known Allergies  Family History  Problem Relation Age of Onset  . Cancer Father 2       colon  . Colon cancer Father   . Dementia Mother   . Hypertension Other     BP 120/74 (BP Location: Left Arm, Patient Position: Sitting)  Pulse 76   Wt 153 lb (69.4 kg)   SpO2 95%   BMI 25.27 kg/m   Review of Systems  Constitutional:       He has lost a few lbs  HENT: Negative for hearing loss.   Eyes: Negative for visual disturbance.  Respiratory: Negative for shortness of breath.   Cardiovascular: Negative for chest pain.  Gastrointestinal: Negative for blood in stool.  Endocrine: Negative for cold intolerance.  Genitourinary: Negative for difficulty urinating and hematuria.  Musculoskeletal: Negative for back pain.  Skin: Negative for rash.  Allergic/Immunologic: Negative for environmental allergies.  Neurological: Negative for numbness.  Hematological: Does not bruise/bleed easily.  Psychiatric/Behavioral:       He has anxiety, related to child custody probs       Objective:   Physical Exam VS: see vs page GEN: no  distress HEAD: head: no deformity eyes: no periorbital swelling, no proptosis external nose and ears are normal mouth: no lesion seen NECK: supple, thyroid is not enlarged CHEST WALL: no deformity LUNGS: clear to auscultation BREASTS:  No gynecomastia CV: reg rate and rhythm, no murmur ABD: abdomen is soft, nontender.  no hepatosplenomegaly.  not distended.  no hernia GENITALIA:  Normal male.   RECTAL: normal external and internal exam.  heme neg. PROSTATE:  Normal size.  No nodule MUSCULOSKELETAL: muscle bulk and strength are grossly normal.  no obvious joint swelling.  gait is normal and steady EXTEMITIES: no deformity.  no ulcer on the feet.  feet are of normal color and temp.  Trace bilat leg edema.  There is bilateral onychomycosis of the toenails PULSES: dorsalis pedis intact bilat.  no carotid bruit NEURO:  cn 2-12 grossly intact.   readily moves all 4's.  sensation is intact to touch on the feet SKIN:  Normal texture and temperature.  No rash or suspicious lesion is visible.   NODES:  None palpable at the neck.   PSYCH: alert, well-oriented.  Does not appear anxious nor depressed.     I personally reviewed electrocardiogram tracing (today):  Indication: wellness Impression: NSR.  No MI.  No hypertrophy. Compared to: no significant change.      Assessment & Plan:  Wellness visit today, with problems stable, except as noted.    SEPARATE EVALUATION FOLLOWS--EACH PROBLEM HERE IS NEW, NOT RESPONDING TO TREATMENT, OR POSES SIGNIFICANT RISK TO THE PATIENT'S HEALTH: HISTORY OF THE PRESENT ILLNESS: Pt states few years of slight dry cough sensation in the chest, but no assoc fever PAST MEDICAL HISTORY Past Medical History:  Diagnosis Date  . Anxiety   . ARM PAIN, LEFT 11/20/2009  . Arthritis   . BIPOLAR DISORDER UNSPECIFIED 12/13/2007   patient denies  . Blood transfusion without reported diagnosis   . CHEST PAIN 03/23/2009   currently having   . DEPRESSION 12/13/2007  .  FATIGUE 03/23/2009  . GERD (gastroesophageal reflux disease)   . GLAUCOMA 09/03/2009  . Headache   . HEMORRHOIDS 09/03/2009  . HOARSENESS 09/03/2009  . HYPERCHOLESTEROLEMIA 03/11/2010  . MYALGIA 08/13/2009  . NECK PAIN 01/18/2009  . Palpitations 03/23/2009  . Polydipsia 01/24/2008  . Shortness of breath 03/23/2009  . THYROID NODULE 08/28/2008   benign on Bx  . THYROIDITIS 12/13/2007  . Tubular adenoma of colon     Past Surgical History:  Procedure Laterality Date  . BACK SURGERY     x 3, lumbar  . EYE SURGERY     2008  . FINGER SURGERY    . TRANSANAL  HEMORRHOIDAL DEARTERIALIZATION N/A 03/07/2016   Procedure: TRANSANAL HEMORRHOIDAL DEARTERIALIZATION HEMORRHOIDAL LIGATION/PEXY EXAM UNDER ANESTHESIA WITH  POSSIBLE HEMORRHOIDECTOMY ;  Surgeon: Stephen Boston, MD;  Location: [facility];  Service: General;  Laterality: N/A;    Social History   Social History  . Marital status: Divorced    Spouse name: N/A  . Number of children: 1  . Years of education: N/A   Occupational History  . 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 Smoking status: Never Smoker  . Smokeless tobacco: Never Used  . Alcohol use No  . Drug use: No  . Sexual activity: Not on file   Other Topics Concern  . Not on file   Social History Narrative   Stephen Schmieg lives with his wife in [place], 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       He is originally form [place]    Current Outpatient Prescriptions on File Prior to Visit  Medication Sig Dispense Refill  . atorvastatin (LIPITOR) 20 MG tablet Take 1 tablet (20 mg total) by mouth daily. 90 tablet 3  . buPROPion (WELLBUTRIN XL) 150 MG 24 hr tablet Take 450 mg by mouth daily.     Marland Kitchen ibuprofen (ADVIL,MOTRIN) 200 MG tablet Take 200 mg by mouth every 6 (six) hours as needed (For pain.).    Marland Kitchen lubiprostone (AMITIZA) 24 MCG capsule Take 1 capsule (24 mcg total) by mouth 2 (two) times daily with a meal. 60 capsule 3  . Multiple Vitamin  (MULTIVITAMIN) tablet Take 1 tablet by mouth daily.      Marland Kitchen omeprazole (PRILOSEC) 40 MG capsule TAKE 1 CAPSULE (40 MG TOTAL) BY MOUTH DAILY. 30 capsule 10  . Travoprost, BAK Free, (TRAVATAN) 0.004 % SOLN ophthalmic solution Place 1 drop into both eyes at bedtime.     No current facility-administered medications on file prior to visit.     No Known Allergies  Family History  Problem Relation Age of Onset  . Cancer Father 65       colon  . Colon cancer Father   . Dementia Mother   . Hypertension Other     BP 120/74 (BP Location: Left Arm, Patient Position: Sitting)   Pulse 76   Wt 153 lb (69.4 kg)   SpO2 95%   BMI 25.27 kg/m   REVIEW OF SYSTEMS: Denies fatigue PHYSICAL EXAMINATION: VITAL SIGNS:  See vs page GENERAL: no distress LUNGS:  Clear to auscultation IMPRESSION: Cough, new, uncertain etiology PLAN:  Check CXR

## 2017-06-16 LAB — HEPATIC FUNCTION PANEL
ALBUMIN: 4.2 g/dL (ref 3.5–5.2)
ALT: 26 U/L (ref 0–53)
AST: 19 U/L (ref 0–37)
Alkaline Phosphatase: 97 U/L (ref 39–117)
Bilirubin, Direct: 0.1 mg/dL (ref 0.0–0.3)
TOTAL PROTEIN: 7.2 g/dL (ref 6.0–8.3)
Total Bilirubin: 0.5 mg/dL (ref 0.2–1.2)

## 2017-06-16 LAB — BASIC METABOLIC PANEL
BUN: 15 mg/dL (ref 6–23)
CALCIUM: 9.3 mg/dL (ref 8.4–10.5)
CO2: 30 meq/L (ref 19–32)
CREATININE: 0.91 mg/dL (ref 0.40–1.50)
Chloride: 104 mEq/L (ref 96–112)
GFR: 111.68 mL/min (ref >60.00)
Glucose, Bld: 109 mg/dL — ABNORMAL HIGH (ref 70–99)
Potassium: 4 mEq/L (ref 3.5–5.1)
SODIUM: 140 meq/L (ref 135–145)

## 2017-06-16 LAB — CBC WITH DIFFERENTIAL/PLATELET
BASOS PCT: 0.9 % (ref 0.0–3.0)
Basophils Absolute: 0 10*3/uL (ref 0.0–0.1)
EOS PCT: 1.1 % (ref 0.0–5.0)
Eosinophils Absolute: 0.1 10*3/uL (ref 0.0–0.7)
HEMATOCRIT: 42.4 % (ref 39.0–52.0)
HEMOGLOBIN: 14.2 g/dL (ref 13.0–17.0)
LYMPHS PCT: 53.5 % — AB (ref 12.0–46.0)
Lymphs Abs: 2.7 10*3/uL (ref 0.7–4.0)
MCHC: 33.5 g/dL (ref 30.0–36.0)
MCV: 86.4 fl (ref 78.0–100.0)
Monocytes Absolute: 0.4 10*3/uL (ref 0.1–1.0)
Monocytes Relative: 8.4 % (ref 3.0–12.0)
Neutro Abs: 1.8 10*3/uL (ref 1.4–7.7)
Neutrophils Relative %: 36.1 % — ABNORMAL LOW (ref 43.0–77.0)
Platelets: 287 10*3/uL (ref 150.0–400.0)
RBC: 4.91 Mil/uL (ref 4.22–5.81)
RDW: 14.2 % (ref 11.5–15.5)
WBC: 5.1 10*3/uL (ref 4.0–10.5)

## 2017-06-16 LAB — URINALYSIS, ROUTINE W REFLEX MICROSCOPIC
Bilirubin Urine: NEGATIVE
Leukocytes, UA: NEGATIVE
Nitrite: NEGATIVE
Specific Gravity, Urine: 1.015 (ref 1.000–1.030)
TOTAL PROTEIN, URINE-UPE24: NEGATIVE
URINE GLUCOSE: NEGATIVE
Urobilinogen, UA: 0.2 (ref 0.0–1.0)
WBC UA: NONE SEEN (ref 0–?)
pH: 6 (ref 5.0–8.0)

## 2017-06-16 LAB — LIPID PANEL
CHOLESTEROL: 164 mg/dL (ref 0–200)
HDL: 71.3 mg/dL (ref >39.00)
NonHDL: 92.61
TRIGLYCERIDES: 321 mg/dL — AB (ref 0.0–149.0)
Total CHOL/HDL Ratio: 2
VLDL: 64.2 mg/dL — AB (ref 0.0–40.0)

## 2017-06-16 LAB — PSA: PSA: 0.48 ng/mL (ref 0.10–4.00)

## 2017-06-16 LAB — LDL CHOLESTEROL, DIRECT: Direct LDL: 66 mg/dL

## 2017-06-16 LAB — TSH: TSH: 2.16 u[IU]/mL (ref 0.35–4.50)

## 2017-07-06 ENCOUNTER — Encounter: Payer: Self-pay | Admitting: Internal Medicine

## 2017-07-06 ENCOUNTER — Ambulatory Visit (INDEPENDENT_AMBULATORY_CARE_PROVIDER_SITE_OTHER): Payer: Managed Care, Other (non HMO) | Admitting: Internal Medicine

## 2017-07-06 VITALS — BP 124/78 | HR 72 | Ht 65.0 in | Wt 154.2 lb

## 2017-07-06 DIAGNOSIS — K219 Gastro-esophageal reflux disease without esophagitis: Secondary | ICD-10-CM

## 2017-07-06 DIAGNOSIS — K5904 Chronic idiopathic constipation: Secondary | ICD-10-CM

## 2017-07-06 MED ORDER — LUBIPROSTONE 24 MCG PO CAPS: 24.0000 ug | ORAL_CAPSULE | Freq: Two times a day (BID) | ORAL | 8 refills | Status: DC

## 2017-07-06 MED ORDER — OMEPRAZOLE 40 MG PO CPDR: 40.0000 mg | DELAYED_RELEASE_CAPSULE | Freq: Every day | ORAL | 10 refills | Status: DC

## 2017-07-06 NOTE — Patient Instructions (Signed)
We have sent the following medications to your pharmacy for you to pick up at your convenience: Amitiza 24 mg twice daily Omeprazole once daily  Please follow up with Dr Hilarie Fredrickson in 1 year.  If you are age 54 or older, your body mass index should be between 23-30. Your Body mass index is 25.67 kg/m. If this is out of the aforementioned range listed, please consider follow up with your Primary Care Provider.  If you are age 48 or younger, your body mass index should be between 19-25. Your Body mass index is 25.67 kg/m. If this is out of the aformentioned range listed, please consider follow up with your Primary Care Provider.

## 2017-07-06 NOTE — Progress Notes (Signed)
   Subjective:    Patient ID: [name], male    DOB: [birth year], 56 y.o.   MRN: [id number]  HPI [name] is a 56 year old male with a history of colon polyp, internal hemorrhoids status post hemorrhoidectomy in May 2017, chronic constipation who is here for follow-up. He was last seen in March 2018. He is here alone today. After last visit we started him on Amitiza 8 g twice daily and this was later increased to 24 g twice daily. Previously he tried Linzess this resulted in loose urgent stools. He was having issues with abdominal bloating, lower and left-sided abdominal pain and constipation.  He reports that he is doing considerably better. He's had no further episodes with abdominal pain. He is having at least a small bowel movement every day but having full more complete bowel movements every 2-3 days. He is pleased with the result. He's had no blood in his stool or melena. He still has intermittent gas and flatulence but no bloating symptom. Appetite has been good. Denies nausea, vomiting, trouble swallowing. He has also been taken omeprazole 40 mg daily for dyspepsia and GERD and the symptoms are well-controlled.  Last colonoscopy was in 2016 with the removal of a small adenoma.  Review of Systems As per history of present illness, otherwise negative  Current Medications, Allergies, Past Medical History, Past Surgical History, Family History and Social History were reviewed in [employer] record.     Objective:   Physical Exam BP 124/78   Pulse 72   Ht 5\' 5"  (1.651 m)   Wt 154 lb 4 oz (70 kg)   BMI 25.67 kg/m  Constitutional: Well-developed and well-nourished. No distress. HEENT: Normocephalic and atraumatic. Oropharynx is clear and moist. Conjunctivae are normal.  No scleral icterus. Neck: Neck supple. Trachea midline. Cardiovascular: Normal rate, regular rhythm and intact distal pulses. No M/R/G Pulmonary/chest: Effort normal and breath sounds  normal. No wheezing, rales or rhonchi. Abdominal: Soft, nontender, nondistended. Bowel sounds active throughout. There are no masses palpable. No hepatosplenomegaly.  Extremities: no clubbing, cyanosis, or edema Neurological: Alert and oriented to person place and time. Skin: Skin is warm and dry. Psychiatric: Normal mood and affect. Behavior is normal.      Assessment & Plan:  56 year old male with a history of colon polyp, internal hemorrhoids status post hemorrhoidectomy in May 2017, chronic constipation who is here for follow-up.   1. Chronic, idiopathic constipation -- doing well on Amitiza. We'll continue Amitiza 24 g twice a day. Improvement in abdominal bloating and resolution of abdominal pain.  2. GERD/dyspepsia -- improved on omeprazole. We discussed the risks, benefits and alternatives to long-term PPI therapy and he wishes to continue. We'll continue omeprazole 40 mg once daily  3. History of colon polyps -- surveillance colonoscopy recommended December 2021  One year follow-up, sooner if necessary 15 minutes spent with the patient today. Greater than 50% was spent in counseling and coordination of care with the patient

## 2017-08-17 ENCOUNTER — Encounter: Payer: Self-pay | Admitting: Family Medicine

## 2017-08-17 ENCOUNTER — Ambulatory Visit (INDEPENDENT_AMBULATORY_CARE_PROVIDER_SITE_OTHER): Payer: Managed Care, Other (non HMO)

## 2017-08-17 ENCOUNTER — Ambulatory Visit (INDEPENDENT_AMBULATORY_CARE_PROVIDER_SITE_OTHER): Payer: Managed Care, Other (non HMO) | Admitting: Family Medicine

## 2017-08-17 VITALS — BP 120/72 | HR 71 | Temp 97.9°F | Resp 16 | Ht 65.0 in | Wt 153.4 lb

## 2017-08-17 DIAGNOSIS — M79601 Pain in right arm: Secondary | ICD-10-CM

## 2017-08-17 DIAGNOSIS — W19XXXA Unspecified fall, initial encounter: Secondary | ICD-10-CM

## 2017-08-17 DIAGNOSIS — R059 Cough, unspecified: Secondary | ICD-10-CM

## 2017-08-17 DIAGNOSIS — R05 Cough: Secondary | ICD-10-CM

## 2017-08-17 MED ORDER — AZITHROMYCIN 250 MG PO TABS: ORAL_TABLET | ORAL | 0 refills | Status: DC

## 2017-08-17 MED ORDER — BENZONATATE 100 MG PO CAPS: 100.0000 mg | ORAL_CAPSULE | Freq: Three times a day (TID) | ORAL | 0 refills | Status: DC | PRN

## 2017-08-17 NOTE — Patient Instructions (Addendum)
  Take the azithromycin 250 mg 2 pills today then 1 pill daily for 4 days for infection in chest  Continue the ibuprofen or aleve as needed for arm pain  Take benzonatate cough pills 1-2 pills every 6-8 hours when needed for cough  Get over-the-counter Robitussin DM if needed for nighttime cough  Return if not improving   IF you received an x-ray today, you will receive an invoice from [facility]. Please contact [facility] at [phone] with questions or concerns regarding your invoice.   IF you received labwork today, you will receive an invoice from [facility]. Please contact [facility] at [phone] with questions or concerns regarding your invoice.   Our billing staff will not be able to assist you with questions regarding bills from these companies.  You will be contacted with the lab results as soon as they are available. The fastest way to get your results is to activate your My Chart account. Instructions are located on the last page of this paperwork. If you have not heard from [place] regarding the results in 2 weeks, please contact this office.
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
## 2017-08-25 IMAGING — DX DG KNEE 1-2V*L*
2 series · 2 of 2 positions shown · non-contrast
Comparison: None.

CLINICAL DATA: Left knee contusion and persisting pain.

EXAM:
LEFT KNEE - 1-2 VIEW

[knee ap]
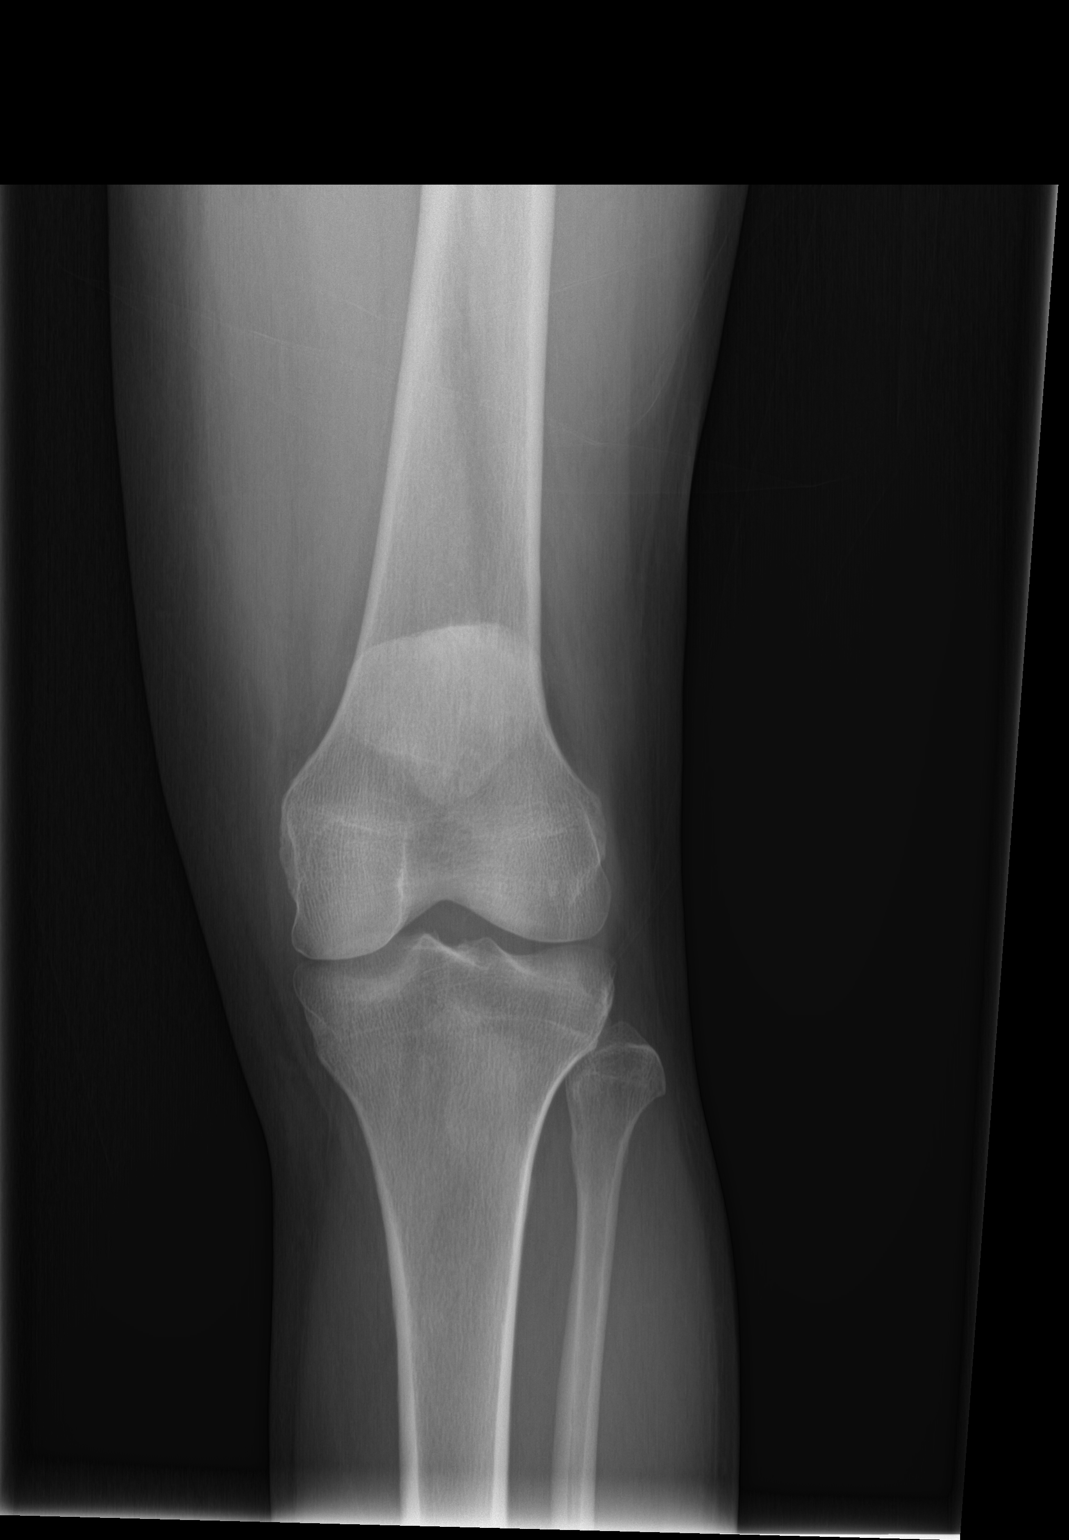

[knee lat]
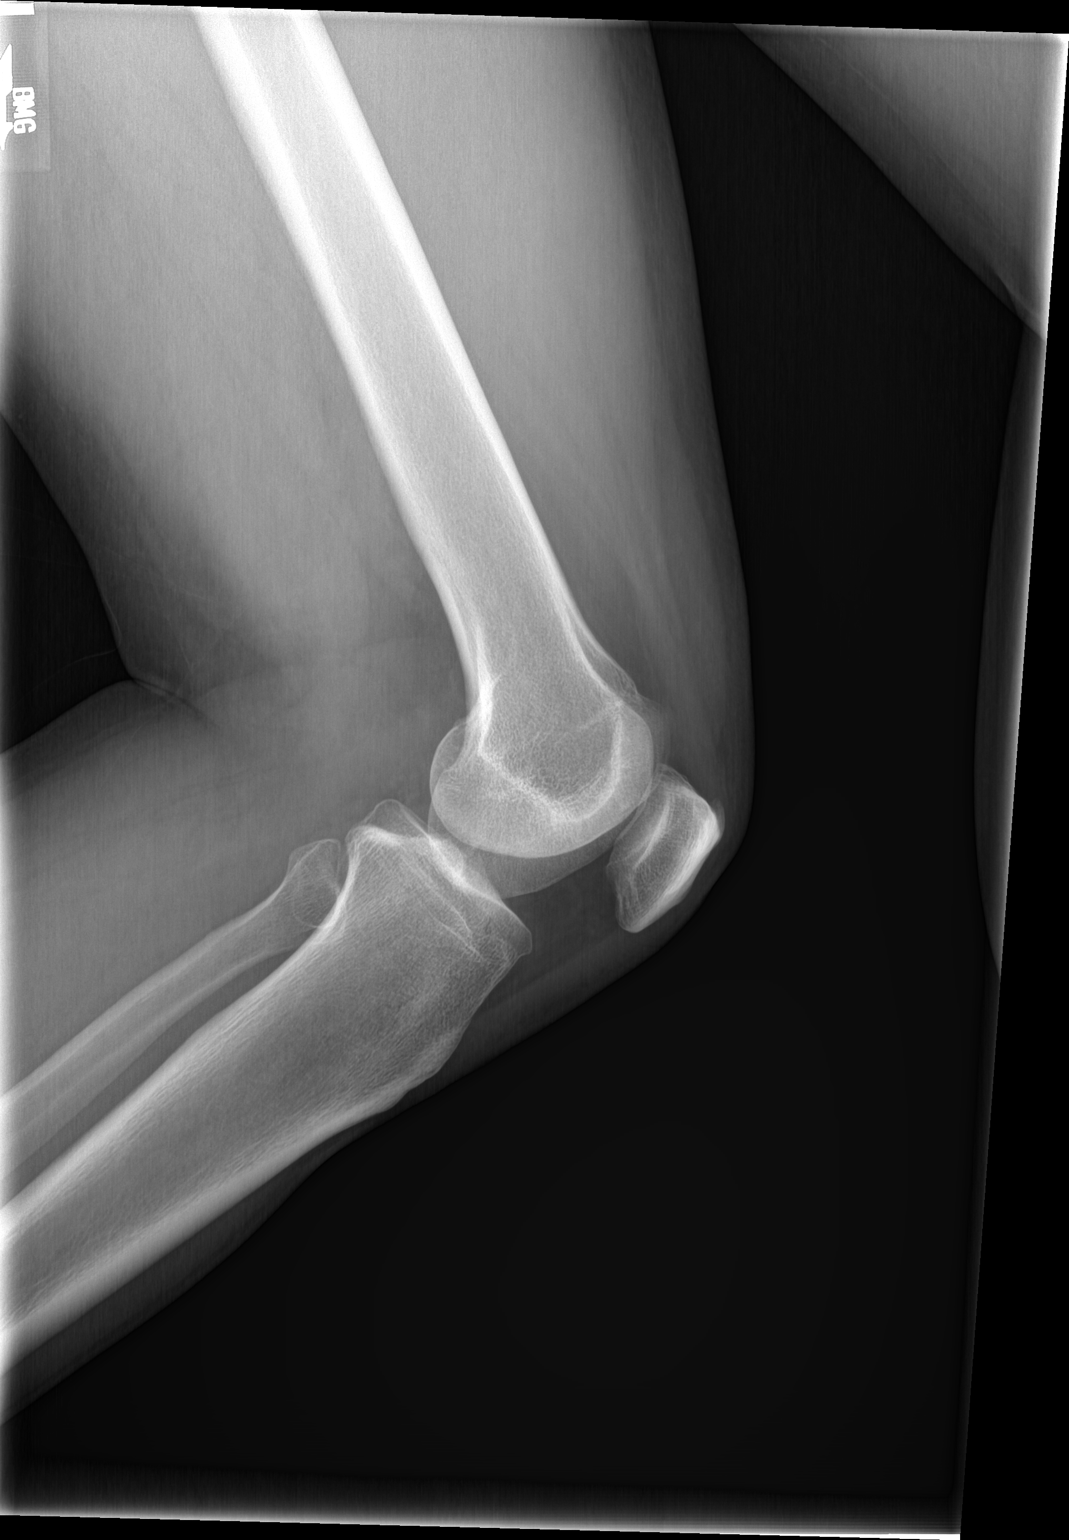

[2 of 2 positions shown; findings below may reference images not displayed]

FINDINGS: No evidence of fracture, dislocation, or joint effusion. No evidence
of arthropathy or other focal bone abnormality. Soft tissues are
unremarkable.
IMPRESSION: Negative radiographs of the left knee.

## 2018-02-08 ENCOUNTER — Ambulatory Visit: Payer: Managed Care, Other (non HMO) | Admitting: Endocrinology

## 2018-02-08 ENCOUNTER — Encounter: Payer: Self-pay | Admitting: Endocrinology

## 2018-02-08 VITALS — BP 128/78 | HR 76 | Temp 97.7°F | Ht 65.0 in | Wt 160.0 lb

## 2018-02-08 DIAGNOSIS — R140 Abdominal distension (gaseous): Secondary | ICD-10-CM | POA: Diagnosis not present

## 2018-02-08 DIAGNOSIS — J029 Acute pharyngitis, unspecified: Secondary | ICD-10-CM | POA: Diagnosis not present

## 2018-02-08 DIAGNOSIS — E041 Nontoxic single thyroid nodule: Secondary | ICD-10-CM | POA: Diagnosis not present

## 2018-02-08 MED ORDER — DICLOFENAC SODIUM 1 % TD GEL: 2.0000 g | Freq: Four times a day (QID) | TRANSDERMAL | 11 refills | Status: DC

## 2018-02-08 MED ORDER — SILDENAFIL CITRATE 100 MG PO TABS: 50.0000 mg | ORAL_TABLET | Freq: Every day | ORAL | 3 refills | Status: DC | PRN

## 2018-02-08 NOTE — Progress Notes (Signed)
Subjective:    Patient ID: [name], male    DOB: Nov 20, 1960, 57 y.o.   MRN: [id number]  HPI 3 weeks of slight bloating at the abdomen, and assoc pain at the ant neck.  He has 5 BM per day, of small volume.   Past Medical History:  Diagnosis Date  . Anxiety   . ARM PAIN, LEFT 11/20/2009  . Arthritis   . BIPOLAR DISORDER UNSPECIFIED 12/13/2007   patient denies  . Blood transfusion without reported diagnosis   . CHEST PAIN 03/23/2009   currently having   . DEPRESSION 12/13/2007  . FATIGUE 03/23/2009  . GERD (gastroesophageal reflux disease)   . GLAUCOMA 09/03/2009  . Headache   . HEMORRHOIDS 09/03/2009  . HOARSENESS 09/03/2009  . HYPERCHOLESTEROLEMIA 03/11/2010  . MYALGIA 08/13/2009  . NECK PAIN 01/18/2009  . Palpitations 03/23/2009  . Polydipsia 01/24/2008  . Shortness of breath 03/23/2009  . THYROID NODULE 08/28/2008   benign on Bx  . THYROIDITIS 12/13/2007  . Tubular adenoma of colon     Past Surgical History:  Procedure Laterality Date  . BACK SURGERY     x 3, lumbar  . EYE SURGERY     2008  . FINGER SURGERY    . TRANSANAL HEMORRHOIDAL DEARTERIALIZATION N/A 03/07/2016   Procedure: TRANSANAL HEMORRHOIDAL DEARTERIALIZATION HEMORRHOIDAL LIGATION/PEXY EXAM UNDER ANESTHESIA WITH  POSSIBLE HEMORRHOIDECTOMY ;  Surgeon: Michael Boston, MD;  Location: [facility];  Service: General;  Laterality: N/A;    Social History   Socioeconomic History  . Marital status: Divorced    Spouse name: Not on file  . Number of children: 1  . Years of education: Not on file  . Highest education level: Not on file  Occupational History  . 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 Financial resource strain: Not on file  . Food insecurity:    Worry: Not on file    Inability: Not on file  . Transportation needs:    Medical: Not on file    Non-medical: Not on file  Tobacco Use  . Smoking status: Never Smoker  . Smokeless tobacco: Never Used  Substance and Sexual Activity  . Alcohol  use: No    Alcohol/week: 0.0 oz  . Drug use: No  . Sexual activity: Not on file  Lifestyle  . Physical activity:    Days per week: Not on file    Minutes per session: Not on file  . Stress: Not on file  Relationships  . Social connections:    Talks on phone: Not on file    Gets together: Not on file    Attends religious service: Not on file    Active member of club or organization: Not on file    Attends meetings of clubs or organizations: Not on file    Relationship status: Not on file  . Intimate partner violence:    Fear of current or ex partner: Not on file    Emotionally abused: Not on file    Physically abused: Not on file    Forced sexual activity: Not on file  Other Topics Concern  . Not on file  Social History Narrative   Mr Cedrone lives with his wife in [place], 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       He is originally form [place]    Current Outpatient Medications on File Prior to Visit  Medication Sig Dispense Refill  . atorvastatin (LIPITOR) 20 MG tablet Take  1 tablet (20 mg total) by mouth daily. 90 tablet 3  . escitalopram (LEXAPRO) 10 MG tablet Take 10 mg by mouth daily.    Marland Kitchen ibuprofen (ADVIL,MOTRIN) 200 MG tablet Take 200 mg by mouth every 6 (six) hours as needed (For pain.).    Marland Kitchen lubiprostone (AMITIZA) 24 MCG capsule Take 1 capsule (24 mcg total) by mouth 2 (two) times daily with a meal. 60 capsule 8  . mirtazapine (REMERON) 45 MG tablet     . Multiple Vitamin (MULTIVITAMIN) tablet Take 1 tablet by mouth daily.      Marland Kitchen omeprazole (PRILOSEC) 40 MG capsule Take 1 capsule (40 mg total) by mouth daily. 30 capsule 10  . Travoprost, BAK Free, (TRAVATAN) 0.004 % SOLN ophthalmic solution Place 1 drop into both eyes at bedtime.     No current facility-administered medications on file prior to visit.     No Known Allergies  Family History  Problem Relation Age of Onset  . Cancer Father 3       colon  . Colon cancer Father   .  Dementia Mother   . Hypertension Other     BP 128/78 (BP Location: Left Arm, Patient Position: Sitting, Cuff Size: Normal)   Pulse 76   Temp 97.7 F (36.5 C) (Oral)   Ht 5\' 5"  (1.651 m)   Wt 160 lb (72.6 kg)   SpO2 95%   BMI 26.63 kg/m    Review of Systems Denies fever and vomiting.  He has left shoulder pain    Objective:   Physical Exam head: no deformity  eyes: no periorbital swelling, no proptosis  external nose and ears are normal  mouth: no lesion seen NECK: There is no palpable thyroid enlargement.  No thyroid nodule is palpable.  No palpable lymphadenopathy at the anterior neck. ABDOMEN: abdomen is soft, nontender.  no hepatosplenomegaly.  not distended.  no hernia.   Left shoulder: full rom, but rom is painful.      Assessment & Plan:  Multinodular goiter: due for recheck abd bloating, recurrent Shoulder pain, new.    Patient Instructions  Please go back to see the specialist.  you will receive a phone call, about a day and time for an appointment.   Let's recheck the ultrasound.  you will receive a phone call, about a day and time for an appointment. I have sent a prescription to your pharmacy, for an anti-pain gel, for your shoulder

## 2018-02-08 NOTE — Patient Instructions (Addendum)
Please go back to see the specialist.  you will receive a phone call, about a day and time for an appointment.   Let's recheck the ultrasound.  you will receive a phone call, about a day and time for an appointment. I have sent a prescription to your pharmacy, for an anti-pain gel, for your shoulder

## 2018-02-15 ENCOUNTER — Telehealth: Payer: Self-pay | Admitting: Endocrinology

## 2018-02-15 NOTE — Telephone Encounter (Signed)
Patient needs to know where to go for Ultrasound that Dr. Loanne Drilling ordered. He has not received a call yet from anyone (he did receive call from Dr. Verl Dicker), but not anyone re: Ultrasound. Patient's ph# [phone]

## 2018-02-15 NOTE — Telephone Encounter (Signed)
Returned pt call and informed him that the referral was sent to GI and that pt has an appointment scheduled on 04/26/18

## 2018-03-01 ENCOUNTER — Other Ambulatory Visit: Payer: Managed Care, Other (non HMO)

## 2018-03-15 ENCOUNTER — Ambulatory Visit
Admission: RE | Admit: 2018-03-15 | Discharge: 2018-03-15 | Disposition: A | Discharge status: Home / Self Care | Payer: Managed Care, Other (non HMO) | Source: Ambulatory Visit | Attending: Endocrinology | Admitting: Endocrinology

## 2018-03-15 DIAGNOSIS — E041 Nontoxic single thyroid nodule: Secondary | ICD-10-CM

## 2018-04-26 ENCOUNTER — Ambulatory Visit: Payer: Managed Care, Other (non HMO) | Admitting: Internal Medicine

## 2018-04-28 ENCOUNTER — Ambulatory Visit: Payer: Managed Care, Other (non HMO) | Admitting: Urgent Care

## 2018-05-03 ENCOUNTER — Encounter: Payer: Self-pay | Admitting: Family Medicine

## 2018-05-03 ENCOUNTER — Other Ambulatory Visit: Payer: Self-pay

## 2018-05-03 ENCOUNTER — Ambulatory Visit: Payer: Managed Care, Other (non HMO) | Admitting: Family Medicine

## 2018-05-03 VITALS — BP 102/80 | HR 81 | Temp 98.7°F | Ht 66.46 in | Wt 164.2 lb

## 2018-05-03 DIAGNOSIS — R0982 Postnasal drip: Secondary | ICD-10-CM

## 2018-05-03 DIAGNOSIS — L309 Dermatitis, unspecified: Secondary | ICD-10-CM | POA: Diagnosis not present

## 2018-05-03 MED ORDER — TRIAMCINOLONE ACETONIDE 0.1 % EX CREA: 1.0000 "application " | TOPICAL_CREAM | Freq: Two times a day (BID) | CUTANEOUS | 0 refills | Status: DC

## 2018-05-03 MED ORDER — FLUTICASONE PROPIONATE 50 MCG/ACT NA SUSP: 1.0000 | Freq: Two times a day (BID) | NASAL | 6 refills | Status: DC

## 2018-05-03 NOTE — Progress Notes (Signed)
7/8/20194:14 PM  [name] 06/16/1961, 57 y.o. male [id number]  Chief Complaint  Patient presents with  . Rash    for the past 10 days  rash itches and burns. Rash is only on the arms. using Hydrocorizone 1% and Loratadine 10%  . Cough    taking otc tussin, did help some. Cough comes and goes with some phlegm    HPI:   Patient is a 57 y.o. male with past medical history significant for HLP and depression who presents today with 2 concerns:  1. Rash on arms for past 10 days. It has been spreading. Mildly itchy. Denies any new soaps, detergents, etc OTC hydrocortisone and claritin providing partial relief  2. Cough, intermittent, for more than 6 months or so. Normal CXR at presentation. Worse at night when he has nasal congestion, Sneezes and itchy eyes. OTC tussin-DM provide partial relief. He does have heartburn but tends to be well controlled with PPI unless he eats something spicy. He denies any SOB, wheezing, occ mild clear phlegm. He does not smoke.   Fall Risk  05/03/2018 08/17/2017 05/12/2016 07/09/2015  Falls in the past year? No Yes Yes No  Number falls in past yr: - 1 1 -  Injury with Fall? - Yes Yes -  Comment - right arm - -     Depression screen [facility] 2/9 05/03/2018 08/17/2017 05/12/2016  Decreased Interest 0 0 0  Down, Depressed, Hopeless 0 0 0  PHQ - 2 Score 0 0 0    No Known Allergies  Prior to Admission medications   Medication Sig Start Date End Date Taking? Authorizing Provider  atorvastatin (LIPITOR) 20 MG tablet Take 1 tablet (20 mg total) by mouth daily. 12/22/16  Yes Renato Shin, MD  diclofenac sodium (VOLTAREN) 1 % GEL Apply 2 g topically 4 (four) times daily. 02/08/18  Yes Renato Shin, MD  escitalopram (LEXAPRO) 10 MG tablet Take 10 mg by mouth daily.   Yes [provider]  ibuprofen (ADVIL,MOTRIN) 200 MG tablet Take 200 mg by mouth every 6 (six) hours as needed (For pain.).   Yes [provider]  lubiprostone (AMITIZA) 24 MCG  capsule Take 1 capsule (24 mcg total) by mouth 2 (two) times daily with a meal. 07/06/17  Yes Pyrtle, Lajuan Lines, MD  mirtazapine (REMERON) 45 MG tablet  06/02/17  Yes [provider]  Multiple Vitamin (MULTIVITAMIN) tablet Take 1 tablet by mouth daily.     Yes [provider]  omeprazole (PRILOSEC) 40 MG capsule Take 1 capsule (40 mg total) by mouth daily. 07/06/17  Yes Pyrtle, Lajuan Lines, MD  sildenafil (VIAGRA) 100 MG tablet Take 0.5-1 tablets (50-100 mg total) by mouth daily as needed for erectile dysfunction. 02/08/18  Yes Renato Shin, MD  Travoprost, BAK Free, (TRAVATAN) 0.004 % SOLN ophthalmic solution Place 1 drop into both eyes at bedtime.   Yes [provider]    Past Medical History:  Diagnosis Date  . Anxiety   . ARM PAIN, LEFT 11/20/2009  . Arthritis   . BIPOLAR DISORDER UNSPECIFIED 12/13/2007   patient denies  . Blood transfusion without reported diagnosis   . CHEST PAIN 03/23/2009   currently having   . DEPRESSION 12/13/2007  . FATIGUE 03/23/2009  . GERD (gastroesophageal reflux disease)   . GLAUCOMA 09/03/2009  . Headache   . HEMORRHOIDS 09/03/2009  . HOARSENESS 09/03/2009  . HYPERCHOLESTEROLEMIA 03/11/2010  . MYALGIA 08/13/2009  . NECK PAIN 01/18/2009  . Palpitations 03/23/2009  . Polydipsia  01/24/2008  . Shortness of breath 03/23/2009  . THYROID NODULE 08/28/2008   benign on Bx  . THYROIDITIS 12/13/2007  . Tubular adenoma of colon     Past Surgical History:  Procedure Laterality Date  . BACK SURGERY     x 3, lumbar  . EYE SURGERY     2008  . FINGER SURGERY    . TRANSANAL HEMORRHOIDAL DEARTERIALIZATION N/A 03/07/2016   Procedure: TRANSANAL HEMORRHOIDAL DEARTERIALIZATION HEMORRHOIDAL LIGATION/PEXY EXAM UNDER ANESTHESIA WITH  POSSIBLE HEMORRHOIDECTOMY ;  Surgeon: Michael Boston, MD;  Location: [facility];  Service: General;  Laterality: N/A;    Social History   Tobacco Use  . Smoking status: Never Smoker  . Smokeless tobacco: Never Used  Substance Use  Topics  . Alcohol use: No    Alcohol/week: 0.0 oz    Family History  Problem Relation Age of Onset  . Cancer Father 39       colon  . Colon cancer Father   . Dementia Mother   . Hypertension Other     Review of Systems  Constitutional: Negative for chills, fever and weight loss.  Respiratory: Positive for cough. Negative for shortness of breath.   Cardiovascular: Negative for chest pain, palpitations and leg swelling.  Gastrointestinal: Negative for abdominal pain, nausea and vomiting.  Skin: Positive for rash.   Per hpi  OBJECTIVE:  Blood pressure 102/80, pulse 81, temperature 98.7 F (37.1 C), temperature source Oral, height 5' 6.46" (1.688 m), weight 164 lb 3.2 oz (74.5 kg), SpO2 96 %.  Physical Exam  Constitutional: He is oriented to person, place, and time. He appears well-developed and well-nourished.  HENT:  Head: Normocephalic and atraumatic.  Right Ear: Hearing, tympanic membrane, external ear and ear canal normal.  Left Ear: Hearing, tympanic membrane, external ear and ear canal normal.  Mouth/Throat: Oropharynx is clear and moist. No oropharyngeal exudate.  Eyes: Pupils are equal, round, and reactive to light. Conjunctivae and EOM are normal.  Neck: Neck supple.  Cardiovascular: Normal rate and regular rhythm. Exam reveals no gallop and no friction rub.  No murmur heard. Pulmonary/Chest: Effort normal and breath sounds normal. He has no wheezes. He has no rales.  Lymphadenopathy:    He has no cervical adenopathy.  Neurological: He is alert and oriented to person, place, and time.  Skin: Skin is warm and dry. Rash (papular rash present only over extensor surfaces of arms) noted.  Psychiatric: He has a normal mood and affect.  Nursing note and vitals reviewed.   ASSESSMENT and PLAN  1. Dermatitis Discussed supportive measures, new meds r/se/b and RTC precautions. Patient educational handout given.  2. Post-nasal drip Discussed supportive measures, new  meds r/se/b and RTC precautions. Patient educational handout given.  Other orders - fluticasone (FLONASE) 50 MCG/ACT nasal spray; Place 1 spray into both nostrils 2 (two) times daily. - triamcinolone cream (KENALOG) 0.1 %; Apply 1 application topically 2 (two) times daily.  Return if symptoms worsen or fail to improve.      Rutherford Guys, MD [facility] [place], [place] [address] Ph.  [phone] Fax [phone]

## 2018-05-03 NOTE — Patient Instructions (Addendum)
For body Dove white bar soap to wash For this rash use triamcinolone ointment Use that twice a day  For Routine For a moisture apply eucerin to damp skin  If you get dry during the day mist water on the skin and reapply eucerin    IF you received an x-ray today, you will receive an invoice from [facility]. Please contact [facility] at [phone] with questions or concerns regarding your invoice.   IF you received labwork today, you will receive an invoice from [facility]. Please contact [facility] at [phone] with questions or concerns regarding your invoice.   Our billing staff will not be able to assist you with questions regarding bills from these companies.  You will be contacted with the lab results as soon as they are available. The fastest way to get your results is to activate your My Chart account. Instructions are located on the last page of this paperwork. If you have not heard from [place] regarding the results in 2 weeks, please contact this office.     Postnasal Drip Postnasal drip is the feeling of mucus going down the back of your throat. Mucus is a slimy substance that moistens and cleans your nose and throat, as well as the air pockets in face bones near your forehead and cheeks (sinuses). Small amounts of mucus pass from your nose and sinuses down the back of your throat all the time. This is normal. When you produce too much mucus or the mucus gets too thick, you can feel it. Some common causes of postnasal drip include:  Having more mucus because of: ? A cold or the flu. ? Allergies. ? Cold air. ? Certain medicines.  Having more mucus that is thicker because of: ? A sinus or nasal infection. ? Dry air. ? A food allergy.  Follow these instructions at home: Relieving discomfort  Gargle with a salt-water mixture 3-4 times a day or as needed. To make a salt-water mixture, completely dissolve -1 tsp of salt in 1 cup of warm water.  If the air  in your home is dry, use a humidifier to add moisture to the air.  Use a saline spray or container (neti pot) to flush out the nose (nasal irrigation). These methods can help clear away mucus and keep the nasal passages moist. General instructions  Take over-the-counter and prescription medicines only as told by your health care provider.  Follow instructions from your health care provider about eating or drinking restrictions. You may need to avoid caffeine.  Avoid things that you know you are allergic to (allergens), like dust, mold, pollen, pets, or certain foods.  Drink enough fluid to keep your urine pale yellow.  Keep all follow-up visits as told by your health care provider. This is important. Contact a health care provider if:  You have a fever.  You have a sore throat.  You have difficulty swallowing.  You have headache.  You have sinus pain.  You have a cough that does not go away.  The mucus from your nose becomes thick and is green or yellow in color.  You have cold or flu symptoms that last more than 10 days. Summary  Postnasal drip is the feeling of mucus going down the back of your throat.  If your health care provider approves, use nasal irrigation or a nasal spray 2?4 times a day.  Avoid things that you know you are allergic to (allergens), like dust, mold, pollen, pets, or certain foods. This  information is not intended to replace advice given to you by your health care provider. Make sure you discuss any questions you have with your health care provider. Document Released: 01/26/2017 Document Revised: 01/26/2017 Document Reviewed: 01/26/2017 Elsevier Interactive Patient Education  [employer].

## 2018-05-11 ENCOUNTER — Ambulatory Visit: Payer: Managed Care, Other (non HMO) | Admitting: Endocrinology

## 2018-05-11 ENCOUNTER — Encounter: Payer: Self-pay | Admitting: Endocrinology

## 2018-05-11 DIAGNOSIS — R21 Rash and other nonspecific skin eruption: Secondary | ICD-10-CM | POA: Insufficient documentation

## 2018-05-11 MED ORDER — TRIAMCINOLONE ACETONIDE 0.5 % EX OINT: 1.0000 "application " | TOPICAL_OINTMENT | Freq: Four times a day (QID) | CUTANEOUS | 1 refills | Status: DC

## 2018-05-11 NOTE — Progress Notes (Signed)
Subjective:    Patient ID: [name], male    DOB: Nov 18, 1960, 57 y.o.   MRN: [id number]  HPI Pt was seen on 05/03/18, for rash.  He is unable to cite precip cause.  He was rx'ed TAC cream.  Since then, he says it is approx 50% better.  He says this is not enough.   Past Medical History:  Diagnosis Date  . Anxiety   . ARM PAIN, LEFT 11/20/2009  . Arthritis   . BIPOLAR DISORDER UNSPECIFIED 12/13/2007   patient denies  . Blood transfusion without reported diagnosis   . CHEST PAIN 03/23/2009   currently having   . DEPRESSION 12/13/2007  . FATIGUE 03/23/2009  . GERD (gastroesophageal reflux disease)   . GLAUCOMA 09/03/2009  . Headache   . HEMORRHOIDS 09/03/2009  . HOARSENESS 09/03/2009  . HYPERCHOLESTEROLEMIA 03/11/2010  . MYALGIA 08/13/2009  . NECK PAIN 01/18/2009  . Palpitations 03/23/2009  . Polydipsia 01/24/2008  . Shortness of breath 03/23/2009  . THYROID NODULE 08/28/2008   benign on Bx  . THYROIDITIS 12/13/2007  . Tubular adenoma of colon     Past Surgical History:  Procedure Laterality Date  . BACK SURGERY     x 3, lumbar  . EYE SURGERY     2008  . FINGER SURGERY    . TRANSANAL HEMORRHOIDAL DEARTERIALIZATION N/A 03/07/2016   Procedure: TRANSANAL HEMORRHOIDAL DEARTERIALIZATION HEMORRHOIDAL LIGATION/PEXY EXAM UNDER ANESTHESIA WITH  POSSIBLE HEMORRHOIDECTOMY ;  Surgeon: Michael Boston, MD;  Location: [facility];  Service: General;  Laterality: N/A;    Social History   Socioeconomic History  . Marital status: Divorced    Spouse name: Not on file  . Number of children: 1  . Years of education: Not on file  . Highest education level: Not on file  Occupational History  . 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 Financial resource strain: Not on file  . Food insecurity:    Worry: Not on file    Inability: Not on file  . Transportation needs:    Medical: Not on file    Non-medical: Not on file  Tobacco Use  . Smoking status: Never Smoker  . Smokeless tobacco:  Never Used  Substance and Sexual Activity  . Alcohol use: No    Alcohol/week: 0.0 oz  . Drug use: No  . Sexual activity: Not on file  Lifestyle  . Physical activity:    Days per week: Not on file    Minutes per session: Not on file  . Stress: Not on file  Relationships  . Social connections:    Talks on phone: Not on file    Gets together: Not on file    Attends religious service: Not on file    Active member of club or organization: Not on file    Attends meetings of clubs or organizations: Not on file    Relationship status: Not on file  . Intimate partner violence:    Fear of current or ex partner: Not on file    Emotionally abused: Not on file    Physically abused: Not on file    Forced sexual activity: Not on file  Other Topics Concern  . Not on file  Social History Narrative   Mr Ekstein lives with his wife in [place], 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       He is originally form [place]    Current Outpatient Medications on File Prior to Visit  Medication Sig Dispense Refill  . atorvastatin (LIPITOR) 20 MG tablet Take 1 tablet (20 mg total) by mouth daily. 90 tablet 3  . diclofenac sodium (VOLTAREN) 1 % GEL Apply 2 g topically 4 (four) times daily. 100 g 11  . escitalopram (LEXAPRO) 10 MG tablet Take 10 mg by mouth daily.    . fluticasone (FLONASE) 50 MCG/ACT nasal spray Place 1 spray into both nostrils 2 (two) times daily. 16 g 6  . ibuprofen (ADVIL,MOTRIN) 200 MG tablet Take 200 mg by mouth every 6 (six) hours as needed (For pain.).    Marland Kitchen lubiprostone (AMITIZA) 24 MCG capsule Take 1 capsule (24 mcg total) by mouth 2 (two) times daily with a meal. 60 capsule 8  . mirtazapine (REMERON) 45 MG tablet     . Multiple Vitamin (MULTIVITAMIN) tablet Take 1 tablet by mouth daily.      Marland Kitchen omeprazole (PRILOSEC) 40 MG capsule Take 1 capsule (40 mg total) by mouth daily. 30 capsule 10  . Poison Ivy Treatments (IVAREST MEDICATED POISON IVY EX) Apply  topically.    . sildenafil (VIAGRA) 100 MG tablet Take 0.5-1 tablets (50-100 mg total) by mouth daily as needed for erectile dysfunction. 30 tablet 3  . Travoprost, BAK Free, (TRAVATAN) 0.004 % SOLN ophthalmic solution Place 1 drop into both eyes at bedtime.     No current facility-administered medications on file prior to visit.     No Known Allergies  Family History  Problem Relation Age of Onset  . Cancer Father 4       colon  . Colon cancer Father   . Dementia Mother   . Hypertension Other     BP 112/84   Pulse 85   Ht 5' 6.45" (1.688 m)   Wt 164 lb (74.4 kg)   SpO2 97%   BMI 26.11 kg/m    Review of Systems Denies fever and sob.    Objective:   Physical Exam VITAL SIGNS:  See vs page GENERAL: no distress Skin: mild eczematous rash on all 4 extremities.        Assessment & Plan:  Eczema, new.  He requests increased rx  Patient Instructions  I have sent a prescription to your pharmacy, for stronger skin ointment.

## 2018-05-11 NOTE — Patient Instructions (Signed)
I have sent a prescription to your pharmacy, for stronger skin ointment.

## 2018-05-21 ENCOUNTER — Telehealth: Payer: Self-pay | Admitting: Endocrinology

## 2018-05-21 DIAGNOSIS — R21 Rash and other nonspecific skin eruption: Secondary | ICD-10-CM

## 2018-05-21 MED ORDER — FLUOCINONIDE 0.05 % EX CREA: 1.0000 "application " | TOPICAL_CREAM | Freq: Two times a day (BID) | CUTANEOUS | 0 refills | Status: DC

## 2018-05-21 NOTE — Telephone Encounter (Signed)
Patient stated he was in last week for a rash on his hands. He is stating that it is getting worse even with using the ointment that he was prescribed . He stated that it has moved to his legs now and would like a new medication

## 2018-05-21 NOTE — Telephone Encounter (Signed)
Please advise 

## 2018-05-21 NOTE — Telephone Encounter (Signed)
I have sent a prescription to your pharmacy, for a stronger cream.  Also, Please see a specialist.  you will receive a phone call, about a day and time for an appointment

## 2018-05-31 ENCOUNTER — Other Ambulatory Visit: Payer: Self-pay | Admitting: Endocrinology

## 2018-06-17 ENCOUNTER — Encounter: Payer: Self-pay | Admitting: Endocrinology

## 2018-06-17 ENCOUNTER — Other Ambulatory Visit: Payer: Self-pay | Admitting: Endocrinology

## 2018-07-05 ENCOUNTER — Ambulatory Visit: Payer: Managed Care, Other (non HMO) | Admitting: Internal Medicine

## 2018-07-12 ENCOUNTER — Encounter: Payer: Self-pay | Admitting: Endocrinology

## 2018-07-12 ENCOUNTER — Ambulatory Visit (INDEPENDENT_AMBULATORY_CARE_PROVIDER_SITE_OTHER): Payer: Managed Care, Other (non HMO) | Admitting: Endocrinology

## 2018-07-12 VITALS — BP 118/76 | HR 74 | Ht 66.0 in | Wt 162.0 lb

## 2018-07-12 DIAGNOSIS — Z119 Encounter for screening for infectious and parasitic diseases, unspecified: Secondary | ICD-10-CM

## 2018-07-12 DIAGNOSIS — Z0000 Encounter for general adult medical examination without abnormal findings: Secondary | ICD-10-CM

## 2018-07-12 DIAGNOSIS — Z125 Encounter for screening for malignant neoplasm of prostate: Secondary | ICD-10-CM

## 2018-07-12 LAB — HEPATIC FUNCTION PANEL
ALBUMIN: 4.3 g/dL (ref 3.5–5.2)
ALT: 29 U/L (ref 0–53)
AST: 23 U/L (ref 0–37)
Alkaline Phosphatase: 116 U/L (ref 39–117)
BILIRUBIN TOTAL: 0.5 mg/dL (ref 0.2–1.2)
Bilirubin, Direct: 0.1 mg/dL (ref 0.0–0.3)
Total Protein: 7.2 g/dL (ref 6.0–8.3)

## 2018-07-12 LAB — PSA: PSA: 0.57 ng/mL (ref 0.10–4.00)

## 2018-07-12 LAB — URINALYSIS, ROUTINE W REFLEX MICROSCOPIC
Bilirubin Urine: NEGATIVE
Ketones, ur: NEGATIVE
Leukocytes, UA: NEGATIVE
NITRITE: NEGATIVE
SPECIFIC GRAVITY, URINE: 1.015 (ref 1.000–1.030)
Total Protein, Urine: NEGATIVE
Urine Glucose: NEGATIVE
Urobilinogen, UA: 0.2 (ref 0.0–1.0)
pH: 7.5 (ref 5.0–8.0)

## 2018-07-12 LAB — LIPID PANEL
CHOL/HDL RATIO: 3
Cholesterol: 175 mg/dL (ref 0–200)
HDL: 68.3 mg/dL (ref >39.00)
LDL CALC: 68 mg/dL (ref 0–99)
NonHDL: 106.63
Triglycerides: 193 mg/dL — ABNORMAL HIGH (ref 0.0–149.0)
VLDL: 38.6 mg/dL (ref 0.0–40.0)

## 2018-07-12 LAB — BASIC METABOLIC PANEL
BUN: 13 mg/dL (ref 6–23)
CHLORIDE: 104 meq/L (ref 96–112)
CO2: 32 meq/L (ref 19–32)
CREATININE: 0.94 mg/dL (ref 0.40–1.50)
Calcium: 9.2 mg/dL (ref 8.4–10.5)
GFR: 107.15 mL/min (ref >60.00)
Glucose, Bld: 84 mg/dL (ref 70–99)
POTASSIUM: 3.8 meq/L (ref 3.5–5.1)
Sodium: 140 mEq/L (ref 135–145)

## 2018-07-12 LAB — CBC WITH DIFFERENTIAL/PLATELET
BASOS PCT: 0.9 % (ref 0.0–3.0)
Basophils Absolute: 0 10*3/uL (ref 0.0–0.1)
EOS PCT: 1.3 % (ref 0.0–5.0)
Eosinophils Absolute: 0.1 10*3/uL (ref 0.0–0.7)
HCT: 40.9 % (ref 39.0–52.0)
Hemoglobin: 13.6 g/dL (ref 13.0–17.0)
LYMPHS PCT: 53.9 % — AB (ref 12.0–46.0)
Lymphs Abs: 2.7 10*3/uL (ref 0.7–4.0)
MCHC: 33.4 g/dL (ref 30.0–36.0)
MCV: 84.6 fl (ref 78.0–100.0)
MONOS PCT: 9.1 % (ref 3.0–12.0)
Monocytes Absolute: 0.5 10*3/uL (ref 0.1–1.0)
NEUTROS PCT: 34.8 % — AB (ref 43.0–77.0)
Neutro Abs: 1.8 10*3/uL (ref 1.4–7.7)
Platelets: 267 10*3/uL (ref 150.0–400.0)
RBC: 4.83 Mil/uL (ref 4.22–5.81)
RDW: 13.7 % (ref 11.5–15.5)
WBC: 5.1 10*3/uL (ref 4.0–10.5)

## 2018-07-12 LAB — TSH: TSH: 1.14 u[IU]/mL (ref 0.35–4.50)

## 2018-07-12 MED ORDER — SILDENAFIL CITRATE 100 MG PO TABS: 50.0000 mg | ORAL_TABLET | Freq: Every day | ORAL | 3 refills | Status: DC | PRN

## 2018-07-12 MED ORDER — DICLOFENAC SODIUM 1 % TD GEL: 2.0000 g | Freq: Four times a day (QID) | TRANSDERMAL | 11 refills | Status: DC

## 2018-07-12 MED ORDER — ATORVASTATIN CALCIUM 20 MG PO TABS: 20.0000 mg | ORAL_TABLET | Freq: Every day | ORAL | 2 refills | Status: DC

## 2018-07-12 MED ORDER — OMEPRAZOLE 40 MG PO CPDR: 40.0000 mg | DELAYED_RELEASE_CAPSULE | Freq: Every day | ORAL | 10 refills | Status: DC

## 2018-07-12 MED ORDER — LUBIPROSTONE 24 MCG PO CAPS: 24.0000 ug | ORAL_CAPSULE | Freq: Two times a day (BID) | ORAL | 8 refills | Status: DC

## 2018-07-12 NOTE — Patient Instructions (Signed)
For the thyroid, all you need is to have your neck examined each year, and the annual blood test.  I would be happy to do this, or your new primary care provider would be happy to do so.  Please consider these measures for your health:  minimize alcohol.  Do not use tobacco products.  Have a colonoscopy at least every 10 years from age 55.  Keep firearms safely stored.  Always use seat belts.  have working smoke alarms in your home.  See an eye doctor and dentist regularly.  Never drive under the influence of alcohol or drugs (including prescription drugs).   blood tests are requested for you today.  We'll let you know about the results.

## 2018-07-12 NOTE — Progress Notes (Signed)
Subjective:    Patient ID: [name], male    DOB: May 05, 1961, 57 y.o.   MRN: [id number]  HPI Pt is here for regular wellness examination, and is feeling pretty well in general, and says chronic med probs are stable, except as noted below Past Medical History:  Diagnosis Date  . Anxiety   . ARM PAIN, LEFT 11/20/2009  . Arthritis   . BIPOLAR DISORDER UNSPECIFIED 12/13/2007   patient denies  . Blood transfusion without reported diagnosis   . CHEST PAIN 03/23/2009   currently having   . DEPRESSION 12/13/2007  . FATIGUE 03/23/2009  . GERD (gastroesophageal reflux disease)   . GLAUCOMA 09/03/2009  . Headache   . HEMORRHOIDS 09/03/2009  . HOARSENESS 09/03/2009  . HYPERCHOLESTEROLEMIA 03/11/2010  . MYALGIA 08/13/2009  . NECK PAIN 01/18/2009  . Palpitations 03/23/2009  . Polydipsia 01/24/2008  . Shortness of breath 03/23/2009  . THYROID NODULE 08/28/2008   benign on Bx  . THYROIDITIS 12/13/2007  . Tubular adenoma of colon     Past Surgical History:  Procedure Laterality Date  . BACK SURGERY     x 3, lumbar  . EYE SURGERY     2008  . FINGER SURGERY    . TRANSANAL HEMORRHOIDAL DEARTERIALIZATION N/A 03/07/2016   Procedure: TRANSANAL HEMORRHOIDAL DEARTERIALIZATION HEMORRHOIDAL LIGATION/PEXY EXAM UNDER ANESTHESIA WITH  POSSIBLE HEMORRHOIDECTOMY ;  Surgeon: Michael Boston, MD;  Location: [facility];  Service: General;  Laterality: N/A;    Social History   Socioeconomic History  . Marital status: Divorced    Spouse name: Not on file  . Number of children: 1  . Years of education: Not on file  . Highest education level: Not on file  Occupational History  . 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 Financial resource strain: Not on file  . Food insecurity:    Worry: Not on file    Inability: Not on file  . Transportation needs:    Medical: Not on file    Non-medical: Not on file  Tobacco Use  . Smoking status: Never Smoker  . Smokeless tobacco: Never Used  Substance and  Sexual Activity  . Alcohol use: No    Alcohol/week: 0.0 standard drinks  . Drug use: No  . Sexual activity: Not on file  Lifestyle  . Physical activity:    Days per week: Not on file    Minutes per session: Not on file  . Stress: Not on file  Relationships  . Social connections:    Talks on phone: Not on file    Gets together: Not on file    Attends religious service: Not on file    Active member of club or organization: Not on file    Attends meetings of clubs or organizations: Not on file    Relationship status: Not on file  . Intimate partner violence:    Fear of current or ex partner: Not on file    Emotionally abused: Not on file    Physically abused: Not on file    Forced sexual activity: Not on file  Other Topics Concern  . 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       He is originally form [place]    Current Outpatient Medications on File Prior to Visit  Medication Sig Dispense Refill  . escitalopram (LEXAPRO) 10 MG tablet Take 10  mg by mouth daily.    . fluticasone (FLONASE) 50 MCG/ACT nasal spray Place 1 spray into both nostrils 2 (two) times daily. 16 g 6  . ibuprofen (ADVIL,MOTRIN) 200 MG tablet Take 200 mg by mouth every 6 (six) hours as needed (For pain.).    Marland Kitchen mirtazapine (REMERON) 45 MG tablet     . Multiple Vitamin (MULTIVITAMIN) tablet Take 1 tablet by mouth daily.      . Poison Ivy Treatments (IVAREST MEDICATED POISON IVY EX) Apply topically.    . Travoprost, BAK Free, (TRAVATAN) 0.004 % SOLN ophthalmic solution Place 1 drop into both eyes at bedtime.     No current facility-administered medications on file prior to visit.     No Known Allergies  Family History  Problem Relation Age of Onset  . Cancer Father 52       colon  . Colon cancer Father   . Dementia Mother   . Hypertension Other     BP 118/76   Pulse 74   Ht 5\' 6"  (1.676 m)   Wt  162 lb (73.5 kg)   SpO2 97%   BMI 26.15 kg/m     Review of Systems Denies fever, fatigue, visual loss, hearing loss, chest pain, sob, memory loss, cold intolerance, BRBPR, hematuria, syncope, numbness, allergy sxs, easy bruising, and rash.  No change in chronic intermitt low back pain.     Objective:   Physical Exam VS: see vs page GEN: no distress HEAD: head: no deformity eyes: no periorbital swelling, no proptosis external nose and ears are normal mouth: no lesion seen NECK: thyroid is slightly enlarged, but I can't tell details.   CHEST WALL: no deformity LUNGS: clear to auscultation CV: reg rate and rhythm, no murmur ABD: abdomen is soft, nontender.  no hepatosplenomegaly.  not distended.  no hernia MUSCULOSKELETAL: muscle bulk and strength are grossly normal.  no obvious joint swelling.  gait is normal and steady EXTEMITIES: no deformity.  no ulcer on the feet, but the skin is dry.  feet are of normal color and temp.  Trace bilat leg edema.  There is bilateral onychomycosis of the toenails PULSES: dorsalis pedis intact bilat.  no carotid bruit NEURO:  cn 2-12 grossly intact.   readily moves all 4's.  sensation is intact to touch on the feet SKIN:  Normal texture and temperature.  No rash or suspicious lesion is visible.   NODES:  None palpable at the neck PSYCH: alert, well-oriented.  Does not appear anxious nor depressed.    I personally reviewed electrocardiogram tracing (today): Indication: wellness Impression: NSR.  No MI.  Repolarization variant--not abnormal.   Compared to: no significant change.      Assessment & Plan:  Wellness visit today, with problems stable, except as noted.   Patient Instructions  For the thyroid, all you need is to have your neck examined each year, and the annual blood test.  I would be happy to do this, or your new primary care provider would be happy to do so.  Please consider these measures for your health:  minimize alcohol.  Do  not use tobacco products.  Have a colonoscopy at least every 10 years from age 15.  Keep firearms safely stored.  Always use seat belts.  have working smoke alarms in your home.  See an eye doctor and dentist regularly.  Never drive under the influence of alcohol or drugs (including prescription drugs).   blood tests are requested for you today.  We'll let you know about the results.

## 2018-07-13 LAB — HEPATITIS A ANTIBODY, TOTAL: HEPATITIS A AB,TOTAL: REACTIVE — AB

## 2019-01-03 ENCOUNTER — Telehealth: Payer: Self-pay | Admitting: Internal Medicine

## 2019-01-03 NOTE — Telephone Encounter (Signed)
Pt states that Joselyn Arrow is not helping as he still has sxs, also copay is very expensive $75, he wants to know if he can have something similar.

## 2019-01-04 NOTE — Telephone Encounter (Signed)
Pt states amitiza is not working and is it expensive. Wants to know if there is something else he can try. Please advise.

## 2019-01-05 NOTE — Telephone Encounter (Signed)
I am not sure that any medication will be less expensive.  Linzess he previously could not tolerate due to diarrhea.  If Amitiza is not helping at 24 mcg twice daily would next try Trulance 3 mg daily

## 2019-01-05 NOTE — Telephone Encounter (Signed)
Left message for pt to call back  °

## 2019-01-06 MED ORDER — PLECANATIDE 3 MG PO TABS: 3.0000 mg | ORAL_TABLET | Freq: Every day | ORAL | 3 refills | Status: DC

## 2019-01-06 NOTE — Telephone Encounter (Signed)
Attempted to call pt again. Left message for him that new script has been sent to the pharmacy.

## 2019-02-25 ENCOUNTER — Ambulatory Visit: Payer: Managed Care, Other (non HMO) | Admitting: Internal Medicine

## 2019-04-07 ENCOUNTER — Telehealth: Payer: Self-pay | Admitting: Internal Medicine

## 2019-04-07 NOTE — Telephone Encounter (Signed)
Attempted to return call.  Phone line busy 

## 2019-04-07 NOTE — Telephone Encounter (Signed)
Patient called wanting to speak with the nurse. He wanted to advised the nurse that the new medication is not working and is not giving him any relief. He would like a call back to advise. And possible a sooner appt with the doctor

## 2019-04-11 NOTE — Telephone Encounter (Signed)
Attempted to return call again.  Phone is not working.  Does not ring .  Will await a return call from the patient.

## 2019-05-04 ENCOUNTER — Other Ambulatory Visit: Payer: Self-pay

## 2019-05-04 ENCOUNTER — Telehealth: Payer: Self-pay | Admitting: *Deleted

## 2019-05-04 ENCOUNTER — Ambulatory Visit (INDEPENDENT_AMBULATORY_CARE_PROVIDER_SITE_OTHER): Payer: Managed Care, Other (non HMO) | Admitting: Internal Medicine

## 2019-05-04 DIAGNOSIS — R140 Abdominal distension (gaseous): Secondary | ICD-10-CM | POA: Diagnosis not present

## 2019-05-04 DIAGNOSIS — L290 Pruritus ani: Secondary | ICD-10-CM | POA: Diagnosis not present

## 2019-05-04 DIAGNOSIS — K5904 Chronic idiopathic constipation: Secondary | ICD-10-CM

## 2019-05-04 DIAGNOSIS — K219 Gastro-esophageal reflux disease without esophagitis: Secondary | ICD-10-CM | POA: Diagnosis not present

## 2019-05-04 DIAGNOSIS — Z86010 Personal history of colonic polyps: Secondary | ICD-10-CM

## 2019-05-04 NOTE — Progress Notes (Signed)
Subjective:    Patient ID: [name], male    DOB: 03-Jul-1961, 58 y.o.   MRN: [id number]  This service was provided via telemedicine.  Doximity app with A/V communication The patient was located at home The provider was located in provider's GI office. The patient did consent to this telephone visit and is aware of possible charges through their insurance for this visit.   The persons participating in this telemedicine service were the patient and I. Time spent on call: 13 minutes   HPI [name] is a 58 year old male with a history of chronic constipation, history of colonic adenoma, internal hemorrhoids status post hemorrhoidectomy in May 2017 who is seen for follow-up.  He is seen virtually today in the setting of COVID-19.  He was last seen in the office in September 2018.  He reports that he is having issues with abdominal bloating which is his biggest complaint.  His constipation he feels for the most part is under good control with Trulance which she is using daily.  He is having about 1 bowel movement per day.  He can occasionally feel some constipation.  His biggest complaint with the bloating is abdominal pressure particularly after eating.  He denies seeing blood in his stool or melena.  He is having some perianal itching almost like before his hemorrhoidectomy.  He does not feel prolapse, fecal smearing or perianal pain.  He reports that he is eating primarily only 1 large meal a day because of his bloating discomfort.  He reports stable weight.  He is using omeprazole 40 mg a day to control his heartburn.  His last colonoscopy was in December 2016 with a 5-year recall recommended.  We reviewed the colonoscopy results today.  Review of Systems As per HPI, otherwise negative  Current Medications, Allergies, Past Medical History, Past Surgical History, Family History and Social History were reviewed in [facility] record.     Objective:   Physical  Exam No physical exam, virtual visit  CBC    Component Value Date/Time   WBC 5.1 07/12/2018 1618   RBC 4.83 07/12/2018 1618   HGB 13.6 07/12/2018 1618   HCT 40.9 07/12/2018 1618   PLT 267.0 07/12/2018 1618   MCV 84.6 07/12/2018 1618   MCV 84.4 05/12/2016 1737   MCH 29.7 05/12/2016 1737   MCH 28.9 03/03/2016 1430   MCHC 33.4 07/12/2018 1618   RDW 13.7 07/12/2018 1618   LYMPHSABS 2.7 07/12/2018 1618   MONOABS 0.5 07/12/2018 1618   EOSABS 0.1 07/12/2018 1618   BASOSABS 0.0 07/12/2018 1618   CMP     Component Value Date/Time   NA 140 07/12/2018 1618   K 3.8 07/12/2018 1618   CL 104 07/12/2018 1618   CO2 32 07/12/2018 1618   GLUCOSE 84 07/12/2018 1618   BUN 13 07/12/2018 1618   CREATININE 0.94 07/12/2018 1618   CREATININE 0.96 05/12/2016 1723   CALCIUM 9.2 07/12/2018 1618   PROT 7.2 07/12/2018 1618   ALBUMIN 4.3 07/12/2018 1618   AST 23 07/12/2018 1618   ALT 29 07/12/2018 1618   ALKPHOS 116 07/12/2018 1618   BILITOT 0.5 07/12/2018 1618   GFRNONAA >60 03/03/2016 1430   GFRAA >60 03/03/2016 1430      Assessment & Plan:  58 year old male with a history of chronic constipation, history of colonic adenoma, internal hemorrhoids status post hemorrhoidectomy in May 2017 who is seen for follow-up.  1.  Abdominal bloating --symptom has persisted despite improvement in  his constipation.  This has affected his appetite and is causing him worry.  I recommend that we perform a CT scan of the abdomen pelvis and if negative empirically treat for SIBO. --CT scan of the abdomen pelvis with contrast --CBC and CMP  2.  Chronic constipation --Trulance is causing near daily bowel movement which would be the intended response.  He previously used Amitiza which did not work as well thus we switched to [employer]. --Continue Trulance 3 mg daily  3.  Perianal itch --likely hemorrhoidal.  RectiCare over-the-counter per box instruction recommended.  He is asked to notify me if this does not help   4.  History of adenoma of the colon --surveillance colonoscopy recommended December 2021  Office follow-up in 2 to 3 months 4.  GERD --well-controlled with omeprazole.  Continue omeprazole 40 mg daily

## 2019-05-04 NOTE — Addendum Note (Signed)
Addended by: Larina Bras on: 05/04/2019 03:44 PM   Modules accepted: Orders

## 2019-05-04 NOTE — Telephone Encounter (Signed)
I have left a message for patient to call back so that I can go over CT appointment information as well as lab appointment information with him.

## 2019-05-04 NOTE — Patient Instructions (Addendum)
You have been scheduled for a CT scan of the abdomen and pelvis at [facility] ([address]---this is in the same building as Press [facility]).   You are scheduled on Friday, 05/20/19 at 8:30 am. You should arrive 15 minutes prior to your appointment time for registration. Please follow the written instructions below on the day of your exam:  WARNING: IF YOU ARE ALLERGIC TO IODINE/X-RAY DYE, PLEASE NOTIFY RADIOLOGY IMMEDIATELY AT [phone]! YOU WILL BE GIVEN A 13 HOUR PREMEDICATION PREP.  1) Do not eat or drink anything after 4:30 am (4 hours prior to your test) 2) You have been given 2 bottles of oral contrast to drink. The solution may taste better if refrigerated, but do NOT add ice or any other liquid to this solution. Shake well before drinking.    Drink 1 bottle of contrast @ 6:30 am (2 hours prior to your exam)  Drink 1 bottle of contrast @ 7:30 am (1 hour prior to your exam)  You may take any medications as prescribed with a small amount of water, if necessary. If you take any of the following medications: METFORMIN, GLUCOPHAGE, GLUCOVANCE, AVANDAMET, RIOMET, FORTAMET, [place] MET, JANUMET, GLUMETZA or METAGLIP, you MAY be asked to HOLD this medication 48 hours AFTER the exam.  The purpose of you drinking the oral contrast is to aid in the visualization of your intestinal tract. The contrast solution may cause some diarrhea. Depending on your individual set of symptoms, you may also receive an intravenous injection of x-ray contrast/dye. Plan on being at [facility] for 30 minutes or longer, depending on the type of exam you are having performed.  This test typically takes 30-45 minutes to complete.  If you have any questions regarding your exam or if you need to reschedule, you may call the CT department at [phone] between the hours of 8:00 am and 5:00 pm, Monday-Friday.  _____________________________________________________________  Your provider has  requested that you go to the basement level for lab work before leaving today. Press "B" on the elevator. The lab is located at the first door on the left as you exit the elevator.  Continue Trulance 3 mg daily  Please purchase the following medications over the counter and take as directed: RectiCare over-the-counter per box instruction recommended.  Please notify to notify me if this does not help.  You will be due for a recall colonoscopy in 09/2020. We will send you a reminder in the mail when it gets closer to that time.  Please follow up with Dr Hilarie Fredrickson in the office in 2 to 3 months.  Continue omeprazole 40 mg daily

## 2019-05-05 MED ORDER — TRULANCE 3 MG PO TABS: 3.0000 mg | ORAL_TABLET | Freq: Every day | ORAL | 3 refills | Status: DC

## 2019-05-05 NOTE — Telephone Encounter (Signed)
Left message for patient to call back  

## 2019-05-05 NOTE — Telephone Encounter (Signed)
Patient has been given verbal instructions regarding time/date/location and prep for CT abd/pelvis as well as instructions for labwork prior to appointment. He indicates that he may need to change the CT appointment and is given the number to CT so he can call to change appointment if he does decide this is necessary.

## 2019-05-09 ENCOUNTER — Telehealth: Payer: Self-pay | Admitting: Internal Medicine

## 2019-05-09 NOTE — Telephone Encounter (Signed)
Pt called to inform that his CT scan was r/s to 8/10. He will come to pick up contrast next week will also have labs.

## 2019-05-09 NOTE — Telephone Encounter (Signed)
Noted  

## 2019-05-09 NOTE — Telephone Encounter (Signed)
Dottie it looks like you scheduled this pt. See note below.

## 2019-05-16 ENCOUNTER — Other Ambulatory Visit: Payer: Managed Care, Other (non HMO)

## 2019-05-16 ENCOUNTER — Other Ambulatory Visit (INDEPENDENT_AMBULATORY_CARE_PROVIDER_SITE_OTHER): Payer: Managed Care, Other (non HMO)

## 2019-05-16 DIAGNOSIS — L290 Pruritus ani: Secondary | ICD-10-CM | POA: Diagnosis not present

## 2019-05-16 DIAGNOSIS — Z86010 Personal history of colonic polyps: Secondary | ICD-10-CM

## 2019-05-16 DIAGNOSIS — K5904 Chronic idiopathic constipation: Secondary | ICD-10-CM | POA: Diagnosis not present

## 2019-05-16 DIAGNOSIS — R140 Abdominal distension (gaseous): Secondary | ICD-10-CM | POA: Diagnosis not present

## 2019-05-16 DIAGNOSIS — K219 Gastro-esophageal reflux disease without esophagitis: Secondary | ICD-10-CM

## 2019-05-16 LAB — CBC WITH DIFFERENTIAL/PLATELET
Basophils Absolute: 0.1 10*3/uL (ref 0.0–0.1)
Basophils Relative: 1.1 % (ref 0.0–3.0)
Eosinophils Absolute: 0 10*3/uL (ref 0.0–0.7)
Eosinophils Relative: 0.8 % (ref 0.0–5.0)
HCT: 41 % (ref 39.0–52.0)
Hemoglobin: 13.6 g/dL (ref 13.0–17.0)
Lymphocytes Relative: 59.5 % — ABNORMAL HIGH (ref 12.0–46.0)
Lymphs Abs: 3.4 10*3/uL (ref 0.7–4.0)
MCHC: 33.1 g/dL (ref 30.0–36.0)
MCV: 85.7 fl (ref 78.0–100.0)
Monocytes Absolute: 0.4 10*3/uL (ref 0.1–1.0)
Monocytes Relative: 7.1 % (ref 3.0–12.0)
Neutro Abs: 1.8 10*3/uL (ref 1.4–7.7)
Neutrophils Relative %: 31.5 % — ABNORMAL LOW (ref 43.0–77.0)
Platelets: 269 10*3/uL (ref 150.0–400.0)
RBC: 4.79 Mil/uL (ref 4.22–5.81)
RDW: 14.2 % (ref 11.5–15.5)
WBC: 5.7 10*3/uL (ref 4.0–10.5)

## 2019-05-17 LAB — COMPREHENSIVE METABOLIC PANEL
ALT: 30 U/L (ref 0–53)
AST: 23 U/L (ref 0–37)
Albumin: 4.5 g/dL (ref 3.5–5.2)
Alkaline Phosphatase: 121 U/L — ABNORMAL HIGH (ref 39–117)
BUN: 16 mg/dL (ref 6–23)
CO2: 28 mEq/L (ref 19–32)
Calcium: 9 mg/dL (ref 8.4–10.5)
Chloride: 104 mEq/L (ref 96–112)
Creatinine, Ser: 1.01 mg/dL (ref 0.40–1.50)
GFR: 92.51 mL/min (ref >60.00)
Glucose, Bld: 118 mg/dL — ABNORMAL HIGH (ref 70–99)
Potassium: 3.7 mEq/L (ref 3.5–5.1)
Sodium: 140 mEq/L (ref 135–145)
Total Bilirubin: 0.5 mg/dL (ref 0.2–1.2)
Total Protein: 7 g/dL (ref 6.0–8.3)

## 2019-05-20 ENCOUNTER — Other Ambulatory Visit: Payer: Self-pay | Admitting: Family Medicine

## 2019-05-20 ENCOUNTER — Other Ambulatory Visit: Payer: Managed Care, Other (non HMO)

## 2019-05-20 NOTE — Telephone Encounter (Signed)
Requested Prescriptions  Pending Prescriptions Disp Refills  . fluticasone (FLONASE) 50 MCG/ACT nasal spray [Pharmacy Med Name: FLUTICASONE PROP 50 MCG SPRAY] 16 g 0    Sig: SPRAY ONE SPRAY IN EACH NOSTRIL TWICE DAILY     Ear, Nose, and Throat: Nasal Preparations - Corticosteroids Failed - 05/20/2019  2:27 PM      Failed - Valid encounter within last 12 months    Recent Outpatient Visits          1 year ago Dermatitis   [facility], Lilia Argue, MD   1 year ago Right arm pain   [facility] at [facility], Fenton Malling, MD   2 years ago Abdominal bloating   [facility] at [facility], MD   3 years ago Constipation, unspecified constipation type   Primary Care at [facility], Fenton Malling, MD   3 years ago Precordial pain   Primary Care at [facility], Linton Ham, MD             Left voicemail for patient to call back to schedule a follow up appointment with PCP.

## 2019-05-23 ENCOUNTER — Telehealth: Payer: Self-pay

## 2019-05-23 NOTE — Telephone Encounter (Signed)
Pt called to request for a new pt appointment at [facility]. Pt is wanting a CPE as well. Called number [phone] but office had already closed. Please call pt.

## 2019-05-23 NOTE — Telephone Encounter (Signed)
Pt called back and wants a call back on his work number: [phone] then option 3 then option 1.   Pt  works at [employer].  Pt has [employer]  Last CPE 07/05/18 with Dr Loanne Drilling. Dr. Loanne Drilling is only doing endo now, so pts needs a new PCP.

## 2019-05-25 NOTE — Telephone Encounter (Signed)
Pt going to see someone at [facility] for now

## 2019-06-06 ENCOUNTER — Other Ambulatory Visit: Payer: Self-pay

## 2019-06-06 ENCOUNTER — Ambulatory Visit (INDEPENDENT_AMBULATORY_CARE_PROVIDER_SITE_OTHER)
Admission: RE | Admit: 2019-06-06 | Discharge: 2019-06-06 | Disposition: A | Discharge status: Home / Self Care | Payer: Managed Care, Other (non HMO) | Source: Ambulatory Visit | Attending: Internal Medicine | Admitting: Internal Medicine

## 2019-06-06 DIAGNOSIS — R140 Abdominal distension (gaseous): Secondary | ICD-10-CM

## 2019-06-06 DIAGNOSIS — Z86010 Personal history of colonic polyps: Secondary | ICD-10-CM

## 2019-06-06 DIAGNOSIS — K219 Gastro-esophageal reflux disease without esophagitis: Secondary | ICD-10-CM

## 2019-06-06 DIAGNOSIS — K5904 Chronic idiopathic constipation: Secondary | ICD-10-CM | POA: Diagnosis not present

## 2019-06-06 MED ORDER — IOHEXOL 300 MG/ML  SOLN
100.0000 mL | Freq: Once | INTRAMUSCULAR | Status: AC | PRN
  Administered 2019-06-06: 11:00:00 100 mL via INTRAVENOUS

## 2019-07-18 ENCOUNTER — Encounter: Payer: Self-pay | Admitting: Family Medicine

## 2019-07-18 ENCOUNTER — Ambulatory Visit (INDEPENDENT_AMBULATORY_CARE_PROVIDER_SITE_OTHER): Payer: Managed Care, Other (non HMO) | Admitting: Family Medicine

## 2019-07-18 ENCOUNTER — Other Ambulatory Visit: Payer: Self-pay

## 2019-07-18 VITALS — BP 120/70 | HR 94 | Temp 98.5°F | Ht 65.0 in | Wt 170.0 lb

## 2019-07-18 DIAGNOSIS — R0982 Postnasal drip: Secondary | ICD-10-CM | POA: Diagnosis not present

## 2019-07-18 DIAGNOSIS — E041 Nontoxic single thyroid nodule: Secondary | ICD-10-CM

## 2019-07-18 DIAGNOSIS — Z23 Encounter for immunization: Secondary | ICD-10-CM

## 2019-07-18 DIAGNOSIS — Z0001 Encounter for general adult medical examination with abnormal findings: Secondary | ICD-10-CM

## 2019-07-18 DIAGNOSIS — Z0000 Encounter for general adult medical examination without abnormal findings: Secondary | ICD-10-CM

## 2019-07-18 DIAGNOSIS — E7800 Pure hypercholesterolemia, unspecified: Secondary | ICD-10-CM

## 2019-07-18 DIAGNOSIS — Z125 Encounter for screening for malignant neoplasm of prostate: Secondary | ICD-10-CM

## 2019-07-18 MED ORDER — OMEPRAZOLE 40 MG PO CPDR: 40.0000 mg | DELAYED_RELEASE_CAPSULE | Freq: Every day | ORAL | 10 refills | Status: DC

## 2019-07-18 MED ORDER — ATORVASTATIN CALCIUM 20 MG PO TABS: 20.0000 mg | ORAL_TABLET | Freq: Every day | ORAL | 2 refills | Status: DC

## 2019-07-18 MED ORDER — IPRATROPIUM BROMIDE 0.03 % NA SOLN: 2.0000 | Freq: Two times a day (BID) | NASAL | 5 refills | Status: DC

## 2019-07-18 NOTE — Patient Instructions (Addendum)
Preventive Care 41-56 Years Old, Male Preventive care refers to lifestyle choices and visits with your health care provider that can promote health and wellness. This includes:  A yearly physical exam. This is also called an annual well check.  Regular dental and eye exams.  Immunizations.  Screening for certain conditions.  Healthy lifestyle choices, such as eating a healthy diet, getting regular exercise, not using drugs or products that contain nicotine and tobacco, and limiting alcohol use. What can I expect for my preventive care visit? Physical exam Your health care provider will check:  Height and weight. These may be used to calculate body mass index (BMI), which is a measurement that tells if you are at a healthy weight.  Heart rate and blood pressure.  Your skin for abnormal spots. Counseling Your health care provider may ask you questions about:  Alcohol, tobacco, and drug use.  Emotional well-being.  Home and relationship well-being.  Sexual activity.  Eating habits.  Work and work Statistician. What immunizations do I need?  Influenza (flu) vaccine  This is recommended every year. Tetanus, diphtheria, and pertussis (Tdap) vaccine  You may need a Td booster every 10 years. Varicella (chickenpox) vaccine  You may need this vaccine if you have not already been vaccinated. Zoster (shingles) vaccine  You may need this after age 64. Measles, mumps, and rubella (MMR) vaccine  You may need at least one dose of MMR if you were born in 1957 or later. You may also need a second dose. Pneumococcal conjugate (PCV13) vaccine  You may need this if you have certain conditions and were not previously vaccinated. Pneumococcal polysaccharide (PPSV23) vaccine  You may need one or two doses if you smoke cigarettes or if you have certain conditions. Meningococcal conjugate (MenACWY) vaccine  You may need this if you have certain conditions. Hepatitis A  vaccine  You may need this if you have certain conditions or if you travel or work in places where you may be exposed to hepatitis A. Hepatitis B vaccine  You may need this if you have certain conditions or if you travel or work in places where you may be exposed to hepatitis B. Haemophilus influenzae type b (Hib) vaccine  You may need this if you have certain risk factors. Human papillomavirus (HPV) vaccine  If recommended by your health care provider, you may need three doses over 6 months. You may receive vaccines as individual doses or as more than one vaccine together in one shot (combination vaccines). Talk with your health care provider about the risks and benefits of combination vaccines. What tests do I need? Blood tests  Lipid and cholesterol levels. These may be checked every 5 years, or more frequently if you are over 60 years old.  Hepatitis C test.  Hepatitis B test. Screening  Lung cancer screening. You may have this screening every year starting at age 43 if you have a 30-pack-year history of smoking and currently smoke or have quit within the past 15 years.  Prostate cancer screening. Recommendations will vary depending on your family history and other risks.  Colorectal cancer screening. All adults should have this screening starting at age 72 and continuing until age 2. Your health care provider may recommend screening at age 14 if you are at increased risk. You will have tests every 1-10 years, depending on your results and the type of screening test.  Diabetes screening. This is done by checking your blood sugar (glucose) after you have not eaten  for a while (fasting). You may have this done every 1-3 years.  Sexually transmitted disease (STD) testing. Follow these instructions at home: Eating and drinking  Eat a diet that includes fresh fruits and vegetables, whole grains, lean protein, and low-fat dairy products.  Take vitamin and mineral supplements as  recommended by your health care provider.  Do not drink alcohol if your health care provider tells you not to drink.  If you drink alcohol: ? Limit how much you have to 0-2 drinks a day. ? Be aware of how much alcohol is in your drink. In the [place] one drink equals one 12 oz bottle of beer (355 mL), one 5 oz glass of wine (148 mL), or one 1 oz glass of hard liquor (44 mL). Lifestyle  Take daily care of your teeth and gums.  Stay active. Exercise for at least 30 minutes on 5 or more days each week.  Do not use any products that contain nicotine or tobacco, such as cigarettes, e-cigarettes, and chewing tobacco. If you need help quitting, ask your health care provider.  If you are sexually active, practice safe sex. Use a condom or other form of protection to prevent STIs (sexually transmitted infections).  Talk with your health care provider about taking a low-dose aspirin every day starting at age 41. What's next?  Go to your health care provider once a year for a well check visit.  Ask your health care provider how often you should have your eyes and teeth checked.  Stay up to date on all vaccines. This information is not intended to replace advice given to you by your health care provider. Make sure you discuss any questions you have with your health care provider. Document Released: 11/09/2015 Document Revised: 10/07/2018 Document Reviewed: 10/07/2018 Elsevier Patient Education  [place].   Zoster Vaccine, Recombinant injection What is this medicine? ZOSTER VACCINE (ZOS ter vak SEEN) is used to prevent shingles in adults 56 years old and over. This vaccine is not used to treat shingles or nerve pain from shingles. This medicine may be used for other purposes; ask your health care provider or pharmacist if you have questions. COMMON BRAND NAME(S): [facility] What should I tell my health care provider before I take this medicine? They need to know if you have any of these  conditions:  blood disorders or disease  cancer like leukemia or lymphoma  immune system problems or therapy  an unusual or allergic reaction to vaccines, other medications, foods, dyes, or preservatives  pregnant or trying to get pregnant  breast-feeding How should I use this medicine? This vaccine is for injection in a muscle. It is given by a health care professional. Talk to your pediatrician regarding the use of this medicine in children. This medicine is not approved for use in children. Overdosage: If you think you have taken too much of this medicine contact a poison control center or emergency room at once. NOTE: This medicine is only for you. Do not share this medicine with others. What if I miss a dose? Keep appointments for follow-up (booster) doses as directed. It is important not to miss your dose. Call your doctor or health care professional if you are unable to keep an appointment. What may interact with this medicine?  medicines that suppress your immune system  medicines to treat cancer  steroid medicines like prednisone or cortisone This list may not describe all possible interactions. Give your health care provider a list of all  the medicines, herbs, non-prescription drugs, or dietary supplements you use. Also tell them if you smoke, drink alcohol, or use illegal drugs. Some items may interact with your medicine. What should I watch for while using this medicine? Visit your doctor for regular check ups. This vaccine, like all vaccines, may not fully protect everyone. What side effects may I notice from receiving this medicine? Side effects that you should report to your doctor or health care professional as soon as possible:  allergic reactions like skin rash, itching or hives, swelling of the face, lips, or tongue  breathing problems Side effects that usually do not require medical attention (report these to your doctor or health care professional if they  continue or are bothersome):  chills  headache  fever  nausea, vomiting  redness, warmth, pain, swelling or itching at site where injected  tiredness This list may not describe all possible side effects. Call your doctor for medical advice about side effects. You may report side effects to FDA at 1-800-FDA-1088. Where should I keep my medicine? This vaccine is only given in a clinic, pharmacy, doctor's office, or other health care setting and will not be stored at home. NOTE: This sheet is a summary. It may not cover all possible information. If you have questions about this medicine, talk to your doctor, pharmacist, or health care provider.  2020 Elsevier/Gold Standard (2017-05-25 13:20:30)     If you have lab work done today you will be contacted with your lab results within the next 2 weeks.  If you have not heard from [place] then please contact us. The fastest way to get your results is to register for My Chart.   IF you received an x-ray today, you will receive an invoice from [facility]. Please contact [facility] at [phone] with questions or concerns regarding your invoice.   IF you received labwork today, you will receive an invoice from [facility]. Please contact [facility] at [phone] with questions or concerns regarding your invoice.   Our billing staff will not be able to assist you with questions regarding bills from these companies.  You will be contacted with the lab results as soon as they are available. The fastest way to get your results is to activate your My Chart account. Instructions are located on the last page of this paperwork. If you have not heard from [place] regarding the results in 2 weeks, please contact this office.

## 2019-07-18 NOTE — Progress Notes (Signed)
9/21/202010:40 AM  [name] Dec 13, 1960, 59 y.o., male [id number]  Chief Complaint  Patient presents with  . Annual Exam    CPE    HPI:   Patient is a 58 y.o. male with past medical history significant for bipolar disorder, HLP, thyroid nodule - benign, thyroiditis, chronic cough, GERD, ED colonic polyps who presents today for CPE  Last CPE Juuly 2019 Colorectal Cancer Screening: due 2021 Prostate Cancer Screening: today HIV Screening: 2019 Seasonal Influenza Vaccination: due Td/Tdap Vaccination: due Nov 2020 Pneumococcal Vaccination: at age 11 Zoster Vaccination: will think about it Frequency of Dental evaluation: having issues with recent dry socket Frequency of Eye evaluation: wears eyeglasses, yearly  Psych, Dr [facility], Dr Raquel James  Depression screen [facility] 2/9 07/18/2019 05/03/2018 08/17/2017  Decreased Interest 0 0 0  Down, Depressed, Hopeless 0 0 0  PHQ - 2 Score 0 0 0    Fall Risk  07/18/2019 05/03/2018 08/17/2017 05/12/2016 07/09/2015  Falls in the past year? 0 No Yes Yes No  Number falls in past yr: 0 - 1 1 -  Injury with Fall? 0 - Yes Yes -  Comment - - right arm - -  Follow up Falls evaluation completed - - - -     No Known Allergies  Prior to Admission medications   Medication Sig Start Date End Date Taking? Authorizing Provider  acetaminophen-codeine (TYLENOL #3) 300-30 MG tablet  07/06/19  Yes [provider]  atorvastatin (LIPITOR) 20 MG tablet Take 1 tablet (20 mg total) by mouth daily. 07/12/18  Yes Renato Shin, MD  cyclobenzaprine (FLEXERIL) 10 MG tablet  07/06/19  Yes [provider]  diclofenac sodium (VOLTAREN) 1 % GEL Apply 2 g topically 4 (four) times daily. 07/12/18  Yes Renato Shin, MD  escitalopram (LEXAPRO) 10 MG tablet Take 10 mg by mouth daily.   Yes [provider]  fluticasone (FLONASE) 50 MCG/ACT nasal spray SPRAY ONE SPRAY IN EACH NOSTRIL TWICE DAILY 05/20/19  Yes Rutherford Guys, MD  ibuprofen (ADVIL,MOTRIN) 200  MG tablet Take 200 mg by mouth every 6 (six) hours as needed (For pain.).   Yes [provider]  mirtazapine (REMERON) 45 MG tablet  06/02/17  Yes [provider]  Multiple Vitamin (MULTIVITAMIN) tablet Take 1 tablet by mouth daily.     Yes [provider]  omeprazole (PRILOSEC) 40 MG capsule Take 1 capsule (40 mg total) by mouth daily. 07/12/18  Yes Renato Shin, MD  Plecanatide (TRULANCE) 3 MG TABS Take 3 mg by mouth daily. 05/05/19  Yes Pyrtle, Lajuan Lines, MD  Poison Ivy Treatments (IVAREST MEDICATED POISON IVY EX) Apply topically.   Yes [provider]  sildenafil (VIAGRA) 100 MG tablet Take 0.5-1 tablets (50-100 mg total) by mouth daily as needed for erectile dysfunction. 07/12/18  Yes Renato Shin, MD  Travoprost, BAK Free, (TRAVATAN) 0.004 % SOLN ophthalmic solution Place 1 drop into both eyes at bedtime.   Yes [provider]    Past Medical History:  Diagnosis Date  . Anxiety   . ARM PAIN, LEFT 11/20/2009  . Arthritis   . BIPOLAR DISORDER UNSPECIFIED 12/13/2007   patient denies  . Blood transfusion without reported diagnosis   . CHEST PAIN 03/23/2009   currently having   . DEPRESSION 12/13/2007  . FATIGUE 03/23/2009  . GERD (gastroesophageal reflux disease)   . GLAUCOMA 09/03/2009  . Headache   . HEMORRHOIDS 09/03/2009  . HOARSENESS 09/03/2009  . HYPERCHOLESTEROLEMIA 03/11/2010  . MYALGIA 08/13/2009  .  NECK PAIN 01/18/2009  . Palpitations 03/23/2009  . Polydipsia 01/24/2008  . Shortness of breath 03/23/2009  . THYROID NODULE 08/28/2008   benign on Bx  . THYROIDITIS 12/13/2007  . Tubular adenoma of colon     Past Surgical History:  Procedure Laterality Date  . BACK SURGERY     x 3, lumbar  . EYE SURGERY     2008  . FINGER SURGERY    . TRANSANAL HEMORRHOIDAL DEARTERIALIZATION N/A 03/07/2016   Procedure: TRANSANAL HEMORRHOIDAL DEARTERIALIZATION HEMORRHOIDAL LIGATION/PEXY EXAM UNDER ANESTHESIA WITH  POSSIBLE HEMORRHOIDECTOMY ;  Surgeon: Michael Boston, MD;  Location: [facility];  Service: General;  Laterality: N/A;    Social History   Tobacco Use  . Smoking status: Never Smoker  . Smokeless tobacco: Never Used  Substance Use Topics  . Alcohol use: No    Alcohol/week: 0.0 standard drinks    Family History  Problem Relation Age of Onset  . Cancer Father 12       colon  . Colon cancer Father   . Dementia Mother   . Hypertension Other     Review of Systems  Constitutional: Negative for chills and fever.  Respiratory: Positive for cough (chronic, PND, flonase not helping). Negative for shortness of breath.   Cardiovascular: Negative for chest pain, palpitations and leg swelling.  Gastrointestinal: Negative for abdominal pain, nausea and vomiting.  Genitourinary: Negative for dysuria and hematuria.  Neurological: Negative for dizziness, tingling and focal weakness.  Psychiatric/Behavioral: The patient does not have insomnia.   All other systems reviewed and are negative.    OBJECTIVE:  Today's Vitals   07/18/19 1034  BP: 120/70  Pulse: 94  Temp: 98.5 F (36.9 C)  TempSrc: Oral  SpO2: 98%  Weight: 170 lb (77.1 kg)  Height: 5\' 5"  (1.651 m)   Body mass index is 28.29 kg/m.   Physical Exam Vitals signs and nursing note reviewed.  Constitutional:      Appearance: He is well-developed.  HENT:     Head: Normocephalic and atraumatic.     Right Ear: Hearing, tympanic membrane, ear canal and external ear normal.     Left Ear: Hearing, tympanic membrane, ear canal and external ear normal.     Mouth/Throat:     Pharynx: No oropharyngeal exudate.  Eyes:     Conjunctiva/sclera: Conjunctivae normal.     Pupils: Pupils are equal, round, and reactive to light.  Neck:     Musculoskeletal: Neck supple.     Thyroid: No thyroid mass, thyromegaly or thyroid tenderness.  Cardiovascular:     Rate and Rhythm: Normal rate and regular rhythm.     Heart sounds: Normal heart sounds. No murmur. No friction rub. No gallop.    Pulmonary:     Effort: Pulmonary effort is normal.     Breath sounds: Normal breath sounds. No wheezing, rhonchi or rales.  Abdominal:     General: Bowel sounds are normal. There is no distension.     Palpations: Abdomen is soft. There is no mass.     Tenderness: There is no abdominal tenderness.  Musculoskeletal: Normal range of motion.     Right lower leg: No edema.     Left lower leg: No edema.  Lymphadenopathy:     Cervical: No cervical adenopathy.  Skin:    General: Skin is warm and dry.  Neurological:     General: No focal deficit present.     Mental Status: He is alert and oriented to person, place,  and time.     Cranial Nerves: No cranial nerve deficit.     Coordination: Coordination normal.     Gait: Gait normal.     Deep Tendon Reflexes: Reflexes are normal and symmetric.  Psychiatric:        Mood and Affect: Mood normal.        Behavior: Behavior normal.     No results found for this or any previous visit (from the past 24 hour(s)).  No results found.   ASSESSMENT and PLAN  1. Annual physical exam Routine HCM labs ordered. HCM reviewed/discussed. Anticipatory guidance regarding healthy weight, lifestyle and choices given.   2. Post-nasal drip Trial of ipratropium nasal spray  3. THYROID NODULE - TSH  4. HYPERCHOLESTEROLEMIA Checking labs today, medications will be adjusted as needed.  - Lipid panel - Comprehensive metabolic panel  5. Screening for prostate cancer - PSA  Other orders - Flu Vaccine QUAD 36+ mos IM - acetaminophen-codeine (TYLENOL #3) 300-30 MG tablet - cyclobenzaprine (FLEXERIL) 10 MG tablet - ipratropium (ATROVENT) 0.03 % nasal spray; Place 2 sprays into both nostrils 2 (two) times daily. - atorvastatin (LIPITOR) 20 MG tablet; Take 1 tablet (20 mg total) by mouth daily.  Return in about 6 months (around 01/15/2020).    Rutherford Guys, MD [facility] at [place],  [address] Ph.  [phone] Fax  [phone]

## 2019-07-19 LAB — COMPREHENSIVE METABOLIC PANEL
ALT: 36 IU/L (ref 0–44)
AST: 32 IU/L (ref 0–40)
Albumin/Globulin Ratio: 2 (ref 1.2–2.2)
Albumin: 4.5 g/dL (ref 3.8–4.9)
Alkaline Phosphatase: 134 IU/L — ABNORMAL HIGH (ref 39–117)
BUN/Creatinine Ratio: 14 (ref 9–20)
BUN: 13 mg/dL (ref 6–24)
Bilirubin Total: 0.5 mg/dL (ref 0.0–1.2)
CO2: 20 mmol/L (ref 20–29)
Calcium: 9.1 mg/dL (ref 8.7–10.2)
Chloride: 104 mmol/L (ref 96–106)
Creatinine, Ser: 0.96 mg/dL (ref 0.76–1.27)
GFR calc Af Amer: 102 mL/min/{1.73_m2} (ref >59)
GFR calc non Af Amer: 88 mL/min/{1.73_m2} (ref >59)
Globulin, Total: 2.2 g/dL (ref 1.5–4.5)
Glucose: 97 mg/dL (ref 65–99)
Potassium: 4.1 mmol/L (ref 3.5–5.2)
Sodium: 139 mmol/L (ref 134–144)
Total Protein: 6.7 g/dL (ref 6.0–8.5)

## 2019-07-19 LAB — LIPID PANEL
Chol/HDL Ratio: 1.9 ratio (ref 0.0–5.0)
Cholesterol, Total: 155 mg/dL (ref 100–199)
HDL: 80 mg/dL (ref >39)
LDL Chol Calc (NIH): 50 mg/dL (ref 0–99)
Triglycerides: 152 mg/dL — ABNORMAL HIGH (ref 0–149)
VLDL Cholesterol Cal: 25 mg/dL (ref 5–40)

## 2019-07-19 LAB — PSA: Prostate Specific Ag, Serum: 0.5 ng/mL (ref 0.0–4.0)

## 2019-07-19 LAB — TSH: TSH: 1.93 u[IU]/mL (ref 0.450–4.500)

## 2019-07-19 NOTE — Addendum Note (Signed)
Addended by: Rutherford Guys on: 07/19/2019 09:20 AM   Modules accepted: Orders

## 2019-09-10 ENCOUNTER — Other Ambulatory Visit: Payer: Self-pay | Admitting: Family Medicine

## 2019-09-11 NOTE — Telephone Encounter (Signed)
Requested medication (s) are due for refill today: no  Requested medication (s) are on the active medication list: no  Last refill:  05/20/2019  Future visit scheduled: no  Notes to clinic:  Medication was discontinued    Requested Prescriptions  Pending Prescriptions Disp Refills   fluticasone (FLONASE) 50 MCG/ACT nasal spray [Pharmacy Med Name: FLUTICASONE PROP 50 MCG SPRAY]  0    Sig: SPRAY ONE SPRAY IN EACH NOSTRIL TWO TIMES A DAY     Ear, Nose, and Throat: Nasal Preparations - Corticosteroids Passed - 09/10/2019  3:05 PM      Passed - Valid encounter within last 12 months    Recent Outpatient Visits          1 month ago Annual physical exam   [facility], Lilia Argue, MD   1 year ago Dermatitis   [facility], Lilia Argue, MD   2 years ago Right arm pain   [facility] at [facility], Fenton Malling, MD   3 years ago Abdominal bloating   [facility], [facility]   3 years ago Constipation, unspecified constipation type   [facility], Fenton Malling, MD

## 2019-09-12 ENCOUNTER — Telehealth: Payer: Self-pay | Admitting: Internal Medicine

## 2019-09-12 NOTE — Telephone Encounter (Signed)
When we get a prior authorization continuation request for this, we will complete ASAP.

## 2019-09-12 NOTE — Telephone Encounter (Signed)
Pt called to inform that beginning October 28, 2019 his insurance will not longer cover Trulance and Amatiza. He said that there are two options: one is to prescribe a generic form or contact his insurance before January first. He stated that his insurance said that if Dr. Hilarie Fredrickson contact the directly they may continue covering it.

## 2019-09-19 NOTE — Telephone Encounter (Signed)
Pt called stating that his insurance told him that they need to hear from Dr. Hilarie Fredrickson before January 1st in order to keep covering Trulance and Dale. Pls call him .

## 2019-09-20 NOTE — Telephone Encounter (Signed)
[name] Key: [id number] Need help? Call us at [phone] Outcome Additional Information Required Drug is covered by current benefit plan. No further PA activity needed Drug Trulance 3MG  tablets Form [employer] Electronic PA Form  Left message for patient advising that unfortunately, we get information indicating that Trulance is covered under his plan. Typically if insurance needs something from [place], they will send a fax to [place] or pharmacy will send a fax to [place]. I asked that if he is getting note from his insurance that he ask them to fax the request to [place]. In addition, I advised he should be taking Trulance and Miralax as per last note in 06/06/19 CT, NOT Trulance and Amitiza. Patient may call back with questions.

## 2019-10-27 ENCOUNTER — Other Ambulatory Visit: Payer: Self-pay | Admitting: Internal Medicine

## 2019-11-01 ENCOUNTER — Ambulatory Visit (INDEPENDENT_AMBULATORY_CARE_PROVIDER_SITE_OTHER): Payer: Managed Care, Other (non HMO) | Admitting: Family Medicine

## 2019-11-01 ENCOUNTER — Encounter: Payer: Self-pay | Admitting: Family Medicine

## 2019-11-01 ENCOUNTER — Other Ambulatory Visit: Payer: Self-pay

## 2019-11-01 VITALS — BP 132/88 | HR 78 | Temp 98.7°F | Ht 65.0 in | Wt 170.0 lb

## 2019-11-01 DIAGNOSIS — M5431 Sciatica, right side: Secondary | ICD-10-CM | POA: Diagnosis not present

## 2019-11-01 DIAGNOSIS — M5441 Lumbago with sciatica, right side: Secondary | ICD-10-CM

## 2019-11-01 MED ORDER — PREDNISONE 20 MG PO TABS: ORAL_TABLET | ORAL | 0 refills | Status: DC

## 2019-11-01 MED ORDER — HYDROCODONE-ACETAMINOPHEN 5-325 MG PO TABS: 1.0000 | ORAL_TABLET | Freq: Four times a day (QID) | ORAL | 0 refills | Status: DC | PRN

## 2019-11-01 MED ORDER — METHOCARBAMOL 500 MG PO TABS: ORAL_TABLET | ORAL | 0 refills | Status: DC

## 2019-11-01 NOTE — Telephone Encounter (Signed)
We have gotten a prior authorization approval fro Trulance 3 mg tablet from 10/31/19-10/30/20 thru [place]. Request ID [id number].

## 2019-11-01 NOTE — Patient Instructions (Addendum)
Stay off work and rest back for the next few days.  If you are hurting too badly on Friday we might have to extend through the weekend.  Take prednisone 20 mg 3 pills once daily for 3 days, then 2 pills once daily for 3 days, then 1 pill once daily for 3 days, then 1/2 pill daily for the last 4 days.  Take the muscle relaxant methocarbamol 500 mg 1 in the morning, 1 in the afternoon, and 2 at bedtime  Take over-the-counter ibuprofen 200 mg 3 pills over 6 or 8 hours as needed for pain  If that does not give sufficient relief, take the Norco hydrocodone medication 1 pill every 4-6 hours as needed for severe pain only  If you are not improving you might need to have further evaluation done.  Try to progress your activity slowly, avoiding heavy lifting and straining the best you can to allow the back to heal up.   Back Exercises The following exercises strengthen the muscles that help to support the trunk and back. They also help to keep the lower back flexible. Doing these exercises can help to prevent back pain or lessen existing pain.  If you have back pain or discomfort, try doing these exercises 2-3 times each day or as told by your health care provider.  As your pain improves, do them once each day, but increase the number of times that you repeat the steps for each exercise (do more repetitions).  To prevent the recurrence of back pain, continue to do these exercises once each day or as told by your health care provider. Do exercises exactly as told by your health care provider and adjust them as directed. It is normal to feel mild stretching, pulling, tightness, or discomfort as you do these exercises, but you should stop right away if you feel sudden pain or your pain gets worse. Exercises Single knee to chest Repeat these steps 3-5 times for each leg: 1. Lie on your back on a firm bed or the floor with your legs extended. 2. Bring one knee to your chest. Your other leg should stay  extended and in contact with the floor. 3. Hold your knee in place by grabbing your knee or thigh with both hands and hold. 4. Pull on your knee until you feel a gentle stretch in your lower back or buttocks. 5. Hold the stretch for 10-30 seconds. 6. Slowly release and straighten your leg. Pelvic tilt Repeat these steps 5-10 times: 1. Lie on your back on a firm bed or the floor with your legs extended. 2. Bend your knees so they are pointing toward the ceiling and your feet are flat on the floor. 3. Tighten your lower abdominal muscles to press your lower back against the floor. This motion will tilt your pelvis so your tailbone points up toward the ceiling instead of pointing to your feet or the floor. 4. With gentle tension and even breathing, hold this position for 5-10 seconds. Cat-cow Repeat these steps until your lower back becomes more flexible: 1. Get into a hands-and-knees position on a firm surface. Keep your hands under your shoulders, and keep your knees under your hips. You may place padding under your knees for comfort. 2. Let your head hang down toward your chest. Contract your abdominal muscles and point your tailbone toward the floor so your lower back becomes rounded like the back of a cat. 3. Hold this position for 5 seconds. 4. Slowly lift your  head, let your abdominal muscles relax and point your tailbone up toward the ceiling so your back forms a sagging arch like the back of a cow. 5. Hold this position for 5 seconds.  Press-ups Repeat these steps 5-10 times: 1. Lie on your abdomen (face-down) on the floor. 2. Place your palms near your head, about shoulder-width apart. 3. Keeping your back as relaxed as possible and keeping your hips on the floor, slowly straighten your arms to raise the top half of your body and lift your shoulders. Do not use your back muscles to raise your upper torso. You may adjust the placement of your hands to make yourself more  comfortable. 4. Hold this position for 5 seconds while you keep your back relaxed. 5. Slowly return to lying flat on the floor.  Bridges Repeat these steps 10 times: 1. Lie on your back on a firm surface. 2. Bend your knees so they are pointing toward the ceiling and your feet are flat on the floor. Your arms should be flat at your sides, next to your body. 3. Tighten your buttocks muscles and lift your buttocks off the floor until your waist is at almost the same height as your knees. You should feel the muscles working in your buttocks and the back of your thighs. If you do not feel these muscles, slide your feet 1-2 inches farther away from your buttocks. 4. Hold this position for 3-5 seconds. 5. Slowly lower your hips to the starting position, and allow your buttocks muscles to relax completely. If this exercise is too easy, try doing it with your arms crossed over your chest. Abdominal crunches Repeat these steps 5-10 times: 1. Lie on your back on a firm bed or the floor with your legs extended. 2. Bend your knees so they are pointing toward the ceiling and your feet are flat on the floor. 3. Cross your arms over your chest. 4. Tip your chin slightly toward your chest without bending your neck. 5. Tighten your abdominal muscles and slowly raise your trunk (torso) high enough to lift your shoulder blades a tiny bit off the floor. Avoid raising your torso higher than that because it can put too much stress on your low back and does not help to strengthen your abdominal muscles. 6. Slowly return to your starting position. Back lifts Repeat these steps 5-10 times: 1. Lie on your abdomen (face-down) with your arms at your sides, and rest your forehead on the floor. 2. Tighten the muscles in your legs and your buttocks. 3. Slowly lift your chest off the floor while you keep your hips pressed to the floor. Keep the back of your head in line with the curve in your back. Your eyes should be  looking at the floor. 4. Hold this position for 3-5 seconds. 5. Slowly return to your starting position. Contact a health care provider if:  Your back pain or discomfort gets much worse when you do an exercise.  Your worsening back pain or discomfort does not lessen within 2 hours after you exercise. If you have any of these problems, stop doing these exercises right away. Do not do them again unless your health care provider says that you can. Get help right away if:  You develop sudden, severe back pain. If this happens, stop doing the exercises right away. Do not do them again unless your health care provider says that you can. This information is not intended to replace advice given to you by  your health care provider. Make sure you discuss any questions you have with your health care provider. Document Revised: 02/17/2019 Document Reviewed: 07/15/2018 Elsevier Patient Education  [employer].  If you have lab work done today you will be contacted with your lab results within the next 2 weeks.  If you have not heard from [place] then please contact us. The fastest way to get your results is to register for My Chart.   IF you received an x-ray today, you will receive an invoice from [facility]. Please contact [facility] at [phone] with questions or concerns regarding your invoice.   IF you received labwork today, you will receive an invoice from [facility]. Please contact [facility] at [phone] with questions or concerns regarding your invoice.   Our billing staff will not be able to assist you with questions regarding bills from these companies.  You will be contacted with the lab results as soon as they are available. The fastest way to get your results is to activate your My Chart account. Instructions are located on the last page of this paperwork. If you have not heard from [place] regarding the results in 2 weeks, please contact this office.

## 2019-11-01 NOTE — Progress Notes (Signed)
Patient ID: [name], male    DOB: [birth year]  Age: 59 y.o. MRN: [id number]  Chief Complaint  Patient presents with  . Back Pain    pain started yesterday on the R side of pt's lower back. 9/10 pain leve RN.     Subjective:   59 year old man who about 15 years ago had disc problems and surgery.  He has done well.  He has been working at [employer].  He was sitting at home at the computer and got up  and had excruciating back pain radiating down to the right foot.  That continues to persist.  He has a hard time getting comfortable.  Says he cannot work today the way it is and wants a excuse.  Current allergies, medications, problem list, past/family and social histories reviewed.  Objective:  BP 132/88   Pulse 78   Temp 98.7 F (37.1 C) (Temporal)   Ht 5\' 5"  (1.651 m)   Wt 170 lb (77.1 kg)   SpO2 98%   BMI 28.29 kg/m   Abdomen soft and nontender.  He is tender just to the right of midline low the edge of the surgical scar.  He hurts in the SI region.  I could not lay him down to do a good straight leg test but seated he has pain on extending his knee.  Negative Homans.  Pain radiates down to the right lateral malleolus area.  Assessment & Plan:   Assessment: 1. Sciatica of right side   2. Acute midline low back pain with right-sided sciatica       Plan: Instructions.  Treat conservatively initially.  No orders of the defined types were placed in this encounter.   Meds ordered this encounter  Medications  . predniSONE (DELTASONE) 20 MG tablet    Sig: Take 3 daily for 3 days, then 2 daily for 3 days, then 1 daily for 3 days, then one half daily    Dispense:  20 tablet    Refill:  0  . methocarbamol (ROBAXIN) 500 MG tablet    Sig: Take 1 in the morning, 1 in the afternoon, and 2 at bedtime for muscle relaxant as needed    Dispense:  40 tablet    Refill:  0  . HYDROcodone-acetaminophen (NORCO) 5-325 MG tablet    Sig: Take 1 tablet by mouth every  6 (six) hours as needed for moderate pain.    Dispense:  30 tablet    Refill:  0         Patient Instructions   Stay off work and rest back for the next few days.  If you are hurting too badly on Friday we might have to extend through the weekend.  Take prednisone 20 mg 3 pills once daily for 3 days, then 2 pills once daily for 3 days, then 1 pill once daily for 3 days, then 1/2 pill daily for the last 4 days.  Take the muscle relaxant methocarbamol 500 mg 1 in the morning, 1 in the afternoon, and 2 at bedtime  Take over-the-counter ibuprofen 200 mg 3 pills over 6 or 8 hours as needed for pain  If that does not give sufficient relief, take the Norco hydrocodone medication 1 pill every 4-6 hours as needed for severe pain only  If you are not improving you might need to have further evaluation done.  Try to progress your activity slowly, avoiding heavy lifting and straining the best you can to allow the  back to heal up.   Back Exercises The following exercises strengthen the muscles that help to support the trunk and back. They also help to keep the lower back flexible. Doing these exercises can help to prevent back pain or lessen existing pain.  If you have back pain or discomfort, try doing these exercises 2-3 times each day or as told by your health care provider.  As your pain improves, do them once each day, but increase the number of times that you repeat the steps for each exercise (do more repetitions).  To prevent the recurrence of back pain, continue to do these exercises once each day or as told by your health care provider. Do exercises exactly as told by your health care provider and adjust them as directed. It is normal to feel mild stretching, pulling, tightness, or discomfort as you do these exercises, but you should stop right away if you feel sudden pain or your pain gets worse. Exercises Single knee to chest Repeat these steps 3-5 times for each leg: 1. Lie on  your back on a firm bed or the floor with your legs extended. 2. Bring one knee to your chest. Your other leg should stay extended and in contact with the floor. 3. Hold your knee in place by grabbing your knee or thigh with both hands and hold. 4. Pull on your knee until you feel a gentle stretch in your lower back or buttocks. 5. Hold the stretch for 10-30 seconds. 6. Slowly release and straighten your leg. Pelvic tilt Repeat these steps 5-10 times: 1. Lie on your back on a firm bed or the floor with your legs extended. 2. Bend your knees so they are pointing toward the ceiling and your feet are flat on the floor. 3. Tighten your lower abdominal muscles to press your lower back against the floor. This motion will tilt your pelvis so your tailbone points up toward the ceiling instead of pointing to your feet or the floor. 4. With gentle tension and even breathing, hold this position for 5-10 seconds. Cat-cow Repeat these steps until your lower back becomes more flexible: 1. Get into a hands-and-knees position on a firm surface. Keep your hands under your shoulders, and keep your knees under your hips. You may place padding under your knees for comfort. 2. Let your head hang down toward your chest. Contract your abdominal muscles and point your tailbone toward the floor so your lower back becomes rounded like the back of a cat. 3. Hold this position for 5 seconds. 4. Slowly lift your head, let your abdominal muscles relax and point your tailbone up toward the ceiling so your back forms a sagging arch like the back of a cow. 5. Hold this position for 5 seconds.  Press-ups Repeat these steps 5-10 times: 1. Lie on your abdomen (face-down) on the floor. 2. Place your palms near your head, about shoulder-width apart. 3. Keeping your back as relaxed as possible and keeping your hips on the floor, slowly straighten your arms to raise the top half of your body and lift your shoulders. Do not use your  back muscles to raise your upper torso. You may adjust the placement of your hands to make yourself more comfortable. 4. Hold this position for 5 seconds while you keep your back relaxed. 5. Slowly return to lying flat on the floor.  Bridges Repeat these steps 10 times: 1. Lie on your back on a firm surface. 2. Bend your knees so  they are pointing toward the ceiling and your feet are flat on the floor. Your arms should be flat at your sides, next to your body. 3. Tighten your buttocks muscles and lift your buttocks off the floor until your waist is at almost the same height as your knees. You should feel the muscles working in your buttocks and the back of your thighs. If you do not feel these muscles, slide your feet 1-2 inches farther away from your buttocks. 4. Hold this position for 3-5 seconds. 5. Slowly lower your hips to the starting position, and allow your buttocks muscles to relax completely. If this exercise is too easy, try doing it with your arms crossed over your chest. Abdominal crunches Repeat these steps 5-10 times: 1. Lie on your back on a firm bed or the floor with your legs extended. 2. Bend your knees so they are pointing toward the ceiling and your feet are flat on the floor. 3. Cross your arms over your chest. 4. Tip your chin slightly toward your chest without bending your neck. 5. Tighten your abdominal muscles and slowly raise your trunk (torso) high enough to lift your shoulder blades a tiny bit off the floor. Avoid raising your torso higher than that because it can put too much stress on your low back and does not help to strengthen your abdominal muscles. 6. Slowly return to your starting position. Back lifts Repeat these steps 5-10 times: 1. Lie on your abdomen (face-down) with your arms at your sides, and rest your forehead on the floor. 2. Tighten the muscles in your legs and your buttocks. 3. Slowly lift your chest off the floor while you keep your hips  pressed to the floor. Keep the back of your head in line with the curve in your back. Your eyes should be looking at the floor. 4. Hold this position for 3-5 seconds. 5. Slowly return to your starting position. Contact a health care provider if:  Your back pain or discomfort gets much worse when you do an exercise.  Your worsening back pain or discomfort does not lessen within 2 hours after you exercise. If you have any of these problems, stop doing these exercises right away. Do not do them again unless your health care provider says that you can. Get help right away if:  You develop sudden, severe back pain. If this happens, stop doing the exercises right away. Do not do them again unless your health care provider says that you can. This information is not intended to replace advice given to you by your health care provider. Make sure you discuss any questions you have with your health care provider. Document Revised: 02/17/2019 Document Reviewed: 07/15/2018 Elsevier Patient Education  [employer].  If you have lab work done today you will be contacted with your lab results within the next 2 weeks.  If you have not heard from [place] then please contact us. The fastest way to get your results is to register for My Chart.   IF you received an x-ray today, you will receive an invoice from [facility]. Please contact [facility] at [phone] with questions or concerns regarding your invoice.   IF you received labwork today, you will receive an invoice from [facility]. Please contact [facility] at [phone] with questions or concerns regarding your invoice.   Our billing staff will not be able to assist you with questions regarding bills from these companies.  You will be contacted with the lab results  as soon as they are available. The fastest way to get your results is to activate your My Chart account. Instructions are located on the last page of this paperwork. If you  have not heard from [place] regarding the results in 2 weeks, please contact this office.        Return if symptoms worsen or fail to improve.   Ruben Reason, MD 11/01/2019

## 2019-11-03 ENCOUNTER — Telehealth: Payer: Self-pay | Admitting: Family Medicine

## 2019-11-03 NOTE — Telephone Encounter (Signed)
Pt called saying that his pain is still continuing and would like to request an extended work note   Please advise

## 2019-11-03 NOTE — Telephone Encounter (Signed)
Work note updated through weekend. Placed at front desk.

## 2020-01-05 ENCOUNTER — Telehealth (INDEPENDENT_AMBULATORY_CARE_PROVIDER_SITE_OTHER): Payer: Managed Care, Other (non HMO) | Admitting: Family Medicine

## 2020-01-05 ENCOUNTER — Other Ambulatory Visit: Payer: Self-pay

## 2020-01-05 DIAGNOSIS — M5431 Sciatica, right side: Secondary | ICD-10-CM | POA: Diagnosis not present

## 2020-01-05 MED ORDER — FLUTICASONE PROPIONATE 50 MCG/ACT NA SUSP: 1.0000 | Freq: Two times a day (BID) | NASAL | 6 refills | Status: DC

## 2020-01-05 MED ORDER — METHOCARBAMOL 500 MG PO TABS: ORAL_TABLET | ORAL | 1 refills | Status: DC

## 2020-01-05 MED ORDER — HYDROCODONE-ACETAMINOPHEN 5-325 MG PO TABS: 1.0000 | ORAL_TABLET | Freq: Four times a day (QID) | ORAL | 0 refills | Status: DC | PRN

## 2020-01-05 MED ORDER — PREDNISONE 20 MG PO TABS: ORAL_TABLET | ORAL | 0 refills | Status: DC

## 2020-01-05 NOTE — Progress Notes (Signed)
Virtual Visit Note  I connected with patient on 01/05/20 at 513pm by video caregility and verified that I am speaking with the correct person using two identifiers. [name] is currently located at home and patient is currently with them during visit. The provider, Rutherford Guys, MD is located in their office at time of visit.  I discussed the limitations, risks, security and privacy concerns of performing an evaluation and management service by telephone and the availability of in person appointments. I also discussed with the patient that there may be a patient responsible charge related to this service. The patient expressed understanding and agreed to proceed.   I provided 14 minutes of non-face-to-face time during this encounter.  CC: back pain  HPI   Saw Dr [name] in Jan 2021 for RIGHT sided sciatica tx with prednisone, methocarbamol, vicodin Placed off work for a week, pain slowly got better, he finally had about 10 days of no pain and then started slowly coming back worse for past 2 days He reports pain in middle and right side of low back Pain radiates to right groin and down his right leg with numbness and tingling He denies any focal weakness or saddle anesthesia He denies any changes in bladder or bowel function but feels that urinating exacerbates back pain at times He denies any dysuria or hematuria ? 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 Moderate thoracolumbar spondylosis. Status post bilateral posterior spinal fusion at L4-5.  No Known Allergies  Prior to Admission medications   Medication Sig Start Date End Date Taking? Authorizing  Provider  atorvastatin (LIPITOR) 20 MG tablet Take 1 tablet (20 mg total) by mouth daily. 07/18/19  Yes Rutherford Guys, MD  diclofenac sodium (VOLTAREN) 1 % GEL Apply 2 g topically 4 (four) times daily. 07/12/18  Yes Renato Shin, MD  escitalopram (LEXAPRO) 10 MG tablet Take 10 mg by mouth daily.   Yes [provider]  ipratropium (ATROVENT) 0.03 % nasal spray Place 2 sprays into both nostrils 2 (two) times daily. 07/18/19  Yes Rutherford Guys, MD  mirtazapine (REMERON) 45 MG tablet  06/02/17  Yes [provider]  Multiple Vitamin (MULTIVITAMIN) tablet Take 1 tablet by mouth daily.     Yes [provider]  omeprazole (PRILOSEC) 40 MG capsule Take 1 capsule (40 mg total) by mouth daily. 07/18/19  Yes Rutherford Guys, MD  sildenafil (VIAGRA) 100 MG tablet Take 0.5-1 tablets (50-100 mg total) by mouth daily as needed for erectile dysfunction. Patient not taking: Reported on 11/01/2019 07/12/18   Renato Shin, MD    Past Medical History:  Diagnosis Date  . Anxiety   . ARM PAIN, LEFT 11/20/2009  . Arthritis   . BIPOLAR DISORDER UNSPECIFIED 12/13/2007   patient denies  . Blood transfusion without reported diagnosis   . CHEST PAIN 03/23/2009   currently having   . DEPRESSION 12/13/2007  . FATIGUE 03/23/2009  . GERD (gastroesophageal reflux disease)   . GLAUCOMA 09/03/2009  . Headache   . HEMORRHOIDS 09/03/2009  . HOARSENESS 09/03/2009  . HYPERCHOLESTEROLEMIA 03/11/2010  . MYALGIA 08/13/2009  . NECK PAIN 01/18/2009  . Palpitations 03/23/2009  .  Polydipsia 01/24/2008  . Shortness of breath 03/23/2009  . THYROID NODULE 08/28/2008   benign on Bx  . THYROIDITIS 12/13/2007  . Tubular adenoma of colon     Past Surgical History:  Procedure Laterality Date  . BACK SURGERY     x 3, lumbar  . EYE SURGERY     2008  . FINGER SURGERY    . TRANSANAL HEMORRHOIDAL DEARTERIALIZATION N/A 03/07/2016   Procedure: TRANSANAL HEMORRHOIDAL DEARTERIALIZATION HEMORRHOIDAL LIGATION/PEXY EXAM  UNDER ANESTHESIA WITH  POSSIBLE HEMORRHOIDECTOMY ;  Surgeon: Michael Boston, MD;  Location: [facility];  Service: General;  Laterality: N/A;    Social History   Tobacco Use  . Smoking status: Never Smoker  . Smokeless tobacco: Never Used  Substance Use Topics  . Alcohol use: No    Alcohol/week: 0.0 standard drinks    Family History  Problem Relation Age of Onset  . Cancer Father 85       colon  . Colon cancer Father   . Dementia Mother   . Hypertension Other     ROS Per hpi  Objective  Vitals as reported by the patient: none  GEN: AAOx3, NAD HEENT: [place]/AT, pupils are symmetrical, EOMI, non-icteric sclera Resp: breathing comfortably, speaking in full sentences Skin: no rashes noted, no pallor Psych: good eye contact, normal mood and affect   ASSESSMENT and PLAN  1. Sciatica of right side Sciatica progressing. No red flag signs. Rx as noted. Reviewed r/se/b. Continue with supportive measures. Work excuse x 1 week given. Referring back to ortho, consider interventional pain mgt.  - predniSONE (DELTASONE) 20 MG tablet; Take 3 daily for 3 days, then 2 daily for 3 days, then 1 daily for 3 days, then one half daily - HYDROcodone-acetaminophen (NORCO) 5-325 MG tablet; Take 1 tablet by mouth every 6 (six) hours as needed for moderate pain. - methocarbamol (ROBAXIN) 500 MG tablet; Take 1 in the morning, 1 in the afternoon, and 2 at bedtime for muscle relaxant as needed - Ambulatory referral to Orthopedic Surgery  Other orders - fluticasone (FLONASE) 50 MCG/ACT nasal spray; Place 1 spray into both nostrils 2 (two) times daily.  FOLLOW-UP: 6 months for CPE   The above assessment and management plan was discussed with the patient. The patient verbalized understanding of and has agreed to the management plan. Patient is aware to call the clinic if symptoms persist or worsen. Patient is aware when to return to the clinic for a follow-up visit. Patient educated on when it is appropriate to  go to the emergency department.     Rutherford Guys, MD [facility] [place],  [address] Ph.  [phone] Fax [phone]

## 2020-01-06 ENCOUNTER — Ambulatory Visit: Payer: Managed Care, Other (non HMO) | Admitting: Adult Health Nurse Practitioner

## 2020-01-18 ENCOUNTER — Ambulatory Visit: Payer: Managed Care, Other (non HMO) | Admitting: Orthopaedic Surgery

## 2020-03-07 ENCOUNTER — Other Ambulatory Visit: Payer: Self-pay | Admitting: Internal Medicine

## 2020-04-13 ENCOUNTER — Telehealth: Payer: Self-pay | Admitting: Internal Medicine

## 2020-04-13 MED ORDER — TRULANCE 3 MG PO TABS: 1.0000 | ORAL_TABLET | Freq: Every day | ORAL | 0 refills | Status: DC

## 2020-04-13 NOTE — Telephone Encounter (Signed)
Rx sent 

## 2020-04-16 ENCOUNTER — Other Ambulatory Visit: Payer: Self-pay

## 2020-04-16 ENCOUNTER — Ambulatory Visit: Payer: Managed Care, Other (non HMO) | Admitting: Emergency Medicine

## 2020-04-16 ENCOUNTER — Encounter: Payer: Self-pay | Admitting: Emergency Medicine

## 2020-04-16 VITALS — BP 143/81 | HR 74 | Temp 97.7°F | Resp 16 | Ht 65.0 in | Wt 165.0 lb

## 2020-04-16 DIAGNOSIS — Z23 Encounter for immunization: Secondary | ICD-10-CM

## 2020-04-16 DIAGNOSIS — R21 Rash and other nonspecific skin eruption: Secondary | ICD-10-CM

## 2020-04-16 DIAGNOSIS — R1310 Dysphagia, unspecified: Secondary | ICD-10-CM | POA: Diagnosis not present

## 2020-04-16 DIAGNOSIS — M542 Cervicalgia: Secondary | ICD-10-CM | POA: Diagnosis not present

## 2020-04-16 MED ORDER — CLOTRIMAZOLE-BETAMETHASONE 1-0.05 % EX CREA: 1.0000 "application " | TOPICAL_CREAM | Freq: Two times a day (BID) | CUTANEOUS | 1 refills | Status: DC

## 2020-04-16 MED ORDER — SILDENAFIL CITRATE 100 MG PO TABS: 50.0000 mg | ORAL_TABLET | Freq: Every day | ORAL | 3 refills | Status: DC | PRN

## 2020-04-16 NOTE — Patient Instructions (Addendum)
   If you have lab work done today you will be contacted with your lab results within the next 2 weeks.  If you have not heard from us then please contact us. The fastest way to get your results is to register for My Chart.   IF you received an x-ray today, you will receive an invoice from [facility]. Please contact [facility] at [phone] with questions or concerns regarding your invoice.   IF you received labwork today, you will receive an invoice from [facility]. Please contact [facility] at [phone] with questions or concerns regarding your invoice.   Our billing staff will not be able to assist you with questions regarding bills from these companies.  You will be contacted with the lab results as soon as they are available. The fastest way to get your results is to activate your My Chart account. Instructions are located on the last page of this paperwork. If you have not heard from us regarding the results in 2 weeks, please contact this office.      Health Maintenance, Male Adopting a healthy lifestyle and getting preventive care are important in promoting health and wellness. Ask your health care provider about:  The right schedule for you to have regular tests and exams.  Things you can do on your own to prevent diseases and keep yourself healthy. What should I know about diet, weight, and exercise? Eat a healthy diet   Eat a diet that includes plenty of vegetables, fruits, low-fat dairy products, and lean protein.  Do not eat a lot of foods that are high in solid fats, added sugars, or sodium. Maintain a healthy weight Body mass index (BMI) is a measurement that can be used to identify possible weight problems. It estimates body fat based on height and weight. Your health care provider can help determine your BMI and help you achieve or maintain a healthy weight. Get regular exercise Get regular exercise. This is one of the most important things you  can do for your health. Most adults should:  Exercise for at least 150 minutes each week. The exercise should increase your heart rate and make you sweat (moderate-intensity exercise).  Do strengthening exercises at least twice a week. This is in addition to the moderate-intensity exercise.  Spend less time sitting. Even light physical activity can be beneficial. Watch cholesterol and blood lipids Have your blood tested for lipids and cholesterol at 57 years of age, then have this test every 5 years. You may need to have your cholesterol levels checked more often if:  Your lipid or cholesterol levels are high.  You are older than 57 years of age.  You are at high risk for heart disease. What should I know about cancer screening? Many types of cancers can be detected early and may often be prevented. Depending on your health history and family history, you may need to have cancer screening at various ages. This may include screening for:  Colorectal cancer.  Prostate cancer.  Skin cancer.  Lung cancer. What should I know about heart disease, diabetes, and high blood pressure? Blood pressure and heart disease  High blood pressure causes heart disease and increases the risk of stroke. This is more likely to develop in people who have high blood pressure readings, are of African descent, or are overweight.  Talk with your health care provider about your target blood pressure readings.  Have your blood pressure checked: ? Every 3-5 years if you are 18-39   years of age. ? Every year if you are 40 years old or older.  If you are between the ages of 65 and 75 and are a current or former smoker, ask your health care provider if you should have a one-time screening for abdominal aortic aneurysm (AAA). Diabetes Have regular diabetes screenings. This checks your fasting blood sugar level. Have the screening done:  Once every three years after age 45 if you are at a normal weight and have  a low risk for diabetes.  More often and at a younger age if you are overweight or have a high risk for diabetes. What should I know about preventing infection? Hepatitis B If you have a higher risk for hepatitis B, you should be screened for this virus. Talk with your health care provider to find out if you are at risk for hepatitis B infection. Hepatitis C Blood testing is recommended for:  Everyone born from 1945 through 1965.  Anyone with known risk factors for hepatitis C. Sexually transmitted infections (STIs)  You should be screened each year for STIs, including gonorrhea and chlamydia, if: ? You are sexually active and are younger than 57 years of age. ? You are older than 57 years of age and your health care provider tells you that you are at risk for this type of infection. ? Your sexual activity has changed since you were last screened, and you are at increased risk for chlamydia or gonorrhea. Ask your health care provider if you are at risk.  Ask your health care provider about whether you are at high risk for HIV. Your health care provider may recommend a prescription medicine to help prevent HIV infection. If you choose to take medicine to prevent HIV, you should first get tested for HIV. You should then be tested every 3 months for as long as you are taking the medicine. Follow these instructions at home: Lifestyle  Do not use any products that contain nicotine or tobacco, such as cigarettes, e-cigarettes, and chewing tobacco. If you need help quitting, ask your health care provider.  Do not use street drugs.  Do not share needles.  Ask your health care provider for help if you need support or information about quitting drugs. Alcohol use  Do not drink alcohol if your health care provider tells you not to drink.  If you drink alcohol: ? Limit how much you have to 0-2 drinks a day. ? Be aware of how much alcohol is in your drink. In the [place] one drink equals one 12  oz bottle of beer (355 mL), one 5 oz glass of wine (148 mL), or one 1 oz glass of hard liquor (44 mL). General instructions  Schedule regular health, dental, and eye exams.  Stay current with your vaccines.  Tell your health care provider if: ? You often feel depressed. ? You have ever been abused or do not feel safe at home. Summary  Adopting a healthy lifestyle and getting preventive care are important in promoting health and wellness.  Follow your health care provider's instructions about healthy diet, exercising, and getting tested or screened for diseases.  Follow your health care provider's instructions on monitoring your cholesterol and blood pressure. This information is not intended to replace advice given to you by your health care provider. Make sure you discuss any questions you have with your health care provider. Document Revised: 10/06/2018 Document Reviewed: 10/06/2018 Elsevier Patient Education  2020 Elsevier Inc.  

## 2020-04-16 NOTE — Addendum Note (Signed)
Addended by: Davina Poke on: 04/16/2020 09:49 AM   Modules accepted: Orders

## 2020-04-16 NOTE — Progress Notes (Signed)
[name] 59 y.o.   Chief Complaint  Patient presents with  . throat problem    per pt feels like something is in the throat for 3 week  . Rash    over 3 months - per pt starts from the buttocks to groin down the upper thighs with itching     HISTORY OF PRESENT ILLNESS: This is a 59 y.o. male complaining of several problems: 1.  Throat issues: Difficulty swallowing when lying down.  No trouble eating or drinking otherwise. Has history of thyroid disease on thyroid nodules. 2.  Chronic rash to left groin area for at least 3 months. No other complaints or medical concerns today.  HPI   Prior to Admission medications   Medication Sig Start Date End Date Taking? Authorizing Provider  atorvastatin (LIPITOR) 20 MG tablet Take 1 tablet (20 mg total) by mouth daily. 07/18/19  Yes Rutherford Guys, MD  escitalopram (LEXAPRO) 10 MG tablet Take 10 mg by mouth daily.   Yes [provider]  HYDROcodone-acetaminophen (NORCO) 5-325 MG tablet Take 1 tablet by mouth every 6 (six) hours as needed for moderate pain. 01/05/20  Yes Rutherford Guys, MD  mirtazapine (REMERON) 45 MG tablet  06/02/17  Yes [provider]  Multiple Vitamin (MULTIVITAMIN) tablet Take 1 tablet by mouth daily.     Yes [provider]  omeprazole (PRILOSEC) 40 MG capsule Take 1 capsule (40 mg total) by mouth daily. 07/18/19  Yes Rutherford Guys, MD  Plecanatide (TRULANCE) 3 MG TABS Take 1 tablet by mouth daily. MUST HAVE OFFICE VISIT FOR FURTHER REFILLS 04/13/20  Yes Pyrtle, Lajuan Lines, MD  sildenafil (VIAGRA) 100 MG tablet Take 0.5-1 tablets (50-100 mg total) by mouth daily as needed for erectile dysfunction. 07/12/18  Yes Renato Shin, MD  diclofenac sodium (VOLTAREN) 1 % GEL Apply 2 g topically 4 (four) times daily. 07/12/18   Renato Shin, MD  fluticasone (FLONASE) 50 MCG/ACT nasal spray Place 1 spray into both nostrils 2 (two) times daily. Patient not taking: Reported on 04/16/2020 01/05/20   Rutherford Guys, MD  methocarbamol (ROBAXIN) 500 MG tablet Take 1 in the morning, 1 in the afternoon, and 2 at bedtime for muscle relaxant as needed Patient not taking: Reported on 04/16/2020 01/05/20   Rutherford Guys, MD  predniSONE (DELTASONE) 20 MG tablet Take 3 daily for 3 days, then 2 daily for 3 days, then 1 daily for 3 days, then one half daily Patient not taking: Reported on 04/16/2020 01/05/20   Rutherford Guys, MD    Not on File  Patient Active Problem List   Diagnosis Date Noted  . Prolapsed internal hemorrhoids, grade 3, s/p [place] ligation/pexy 03/07/2016 03/07/2016  . Depression 08/16/2015  . Erectile dysfunction 04/25/2015  . 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 HYPERCHOLESTEROLEMIA 03/11/2010  . GLAUCOMA 09/03/2009  . THYROID NODULE 08/28/2008  . Thyroiditis 12/13/2007  . BIPOLAR DISORDER UNSPECIFIED 12/13/2007    Past Medical History:  Diagnosis Date  . Anxiety   . ARM PAIN, LEFT 11/20/2009  . Arthritis   . BIPOLAR DISORDER UNSPECIFIED 12/13/2007   patient denies  . Blood transfusion without reported diagnosis   . CHEST PAIN 03/23/2009   currently having   . DEPRESSION 12/13/2007  . FATIGUE 03/23/2009  . GERD (gastroesophageal reflux disease)   . GLAUCOMA 09/03/2009  . Headache   . HEMORRHOIDS 09/03/2009  . HOARSENESS 09/03/2009  . HYPERCHOLESTEROLEMIA 03/11/2010  . MYALGIA 08/13/2009  . NECK PAIN 01/18/2009  .  Palpitations 03/23/2009  . Polydipsia 01/24/2008  . Shortness of breath 03/23/2009  . THYROID NODULE 08/28/2008   benign on Bx  . THYROIDITIS 12/13/2007  . Tubular adenoma of colon     Past Surgical History:  Procedure Laterality Date  . BACK SURGERY     x 3, lumbar  . EYE SURGERY     2008  . FINGER SURGERY    . TRANSANAL HEMORRHOIDAL DEARTERIALIZATION N/A 03/07/2016   Procedure: TRANSANAL HEMORRHOIDAL DEARTERIALIZATION HEMORRHOIDAL LIGATION/PEXY EXAM UNDER ANESTHESIA WITH  POSSIBLE HEMORRHOIDECTOMY ;  Surgeon: Michael Boston, MD;  Location: [facility];  Service: General;   Laterality: N/A;    Social History   Socioeconomic History  . Marital status: Divorced    Spouse name: Not on file  . Number of children: 1  . Years of education: Not on file  . Highest education level: Not on file  Occupational History  . Occupation: Museum/gallery conservator at [employer]  . Smoking status: Never Smoker  . Smokeless tobacco: Never Used  Vaping Use  . Vaping Use: Never used  Substance and Sexual Activity  . Alcohol use: No    Alcohol/week: 0.0 standard drinks  . Drug use: No  . Sexual activity: Not on file  Other Topics Concern  . Not on file  Social History Narrative   Mr Tuzzolino lives with his wife in [place], 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       He is originally form [place]   Social Determinants of Radio broadcast assistant Strain:   . Difficulty of Paying Living Expenses:   Food Insecurity:   . Worried About Charity fundraiser in the Last Year:   . Arboriculturist in the Last Year:   Transportation Needs:   . Film/video editor (Medical):   Marland Kitchen Lack of Transportation (Non-Medical):   Physical Activity:   . Days of Exercise per Week:   . Minutes of Exercise per Session:   Stress:   . Feeling of Stress :   Social Connections:   . Frequency of Communication with Friends and Family:   . Frequency of Social Gatherings with Friends and Family:   . Attends Religious Services:   . Active Member of Clubs or Organizations:   . Attends Archivist Meetings:   Marland Kitchen Marital Status:   Intimate Partner Violence:   . Fear of Current or Ex-Partner:   . Emotionally Abused:   Marland Kitchen Physically Abused:   . Sexually Abused:     Family History  Problem Relation Age of Onset  . Cancer Father 31       colon  . Colon cancer Father   . Dementia Mother   . Hypertension Other      Review of Systems  Constitutional: Negative.  Negative for chills and fever.  HENT: Negative for congestion.   Respiratory: Negative.   Negative for cough and shortness of breath.   Cardiovascular: Negative.  Negative for chest pain.  Gastrointestinal: Negative.  Negative for abdominal pain, nausea and vomiting.  Genitourinary: Negative.  Negative for dysuria and hematuria.  Musculoskeletal: Negative for myalgias.  Skin: Positive for rash.  All other systems reviewed and are negative.    Physical Exam Vitals reviewed.  Constitutional:      Appearance: Normal appearance.  HENT:     Head: Normocephalic.     Mouth/Throat:     Mouth: Mucous membranes are moist.     Pharynx:  Oropharynx is clear. No oropharyngeal exudate or posterior oropharyngeal erythema.  Eyes:     Extraocular Movements: Extraocular movements intact.     Pupils: Pupils are equal, round, and reactive to light.  Cardiovascular:     Rate and Rhythm: Normal rate and regular rhythm.     Pulses: Normal pulses.     Heart sounds: Normal heart sounds.  Pulmonary:     Effort: Pulmonary effort is normal.     Breath sounds: Normal breath sounds.  Abdominal:     Palpations: Abdomen is soft.     Tenderness: There is no abdominal tenderness.  Musculoskeletal:     Cervical back: Normal range of motion and neck supple. No tenderness.  Lymphadenopathy:     Cervical: No cervical adenopathy.  Skin:    General: Skin is warm and dry.     Capillary Refill: Capillary refill takes less than 2 seconds.     Findings: Rash present.     Comments: Erythematous rash to left groin area including left scrotal area  Neurological:     General: No focal deficit present.     Mental Status: He is alert and oriented to person, place, and time.  Psychiatric:        Mood and Affect: Mood normal.        Behavior: Behavior normal.    A total of 30 minutes was spent with the patient, greater than 50% of which was in counseling/coordination of care regarding differential diagnosis of throat discomfort and groin rash, review of most recent office visit notes, review of most recent  blood work results, need for ENT evaluation, prognosis and need for follow-up with Dr. Pamella Pert.   ASSESSMENT & PLAN: Sargent was seen today for throat problem and rash.  Diagnoses and all orders for this visit:  Rash and nonspecific skin eruption -     clotrimazole-betamethasone (LOTRISONE) cream; Apply 1 application topically 2 (two) times daily.  Dysphagia, unspecified type -     Ambulatory referral to ENT  Neck discomfort -     Ambulatory referral to ENT  Need for diphtheria-tetanus-pertussis (Tdap) vaccine -     Tdap vaccine greater than or equal to 7yo IM    Patient Instructions       If you have lab work done today you will be contacted with your lab results within the next 2 weeks.  If you have not heard from [place] then please contact us. The fastest way to get your results is to register for My Chart.   IF you received an x-ray today, you will receive an invoice from [facility]. Please contact [facility] at [phone] with questions or concerns regarding your invoice.   IF you received labwork today, you will receive an invoice from [place]. Please contact [facility] at [phone] with questions or concerns regarding your invoice.   Our billing staff will not be able to assist you with questions regarding bills from these companies.  You will be contacted with the lab results as soon as they are available. The fastest way to get your results is to activate your My Chart account. Instructions are located on the last page of this paperwork. If you have not heard from [place] regarding the results in 2 weeks, please contact this office.     Health Maintenance, Male Adopting a healthy lifestyle and getting preventive care are important in promoting health and wellness. Ask your health care provider about:  The right schedule for you to have regular tests  and exams.  Things you can do on your own to prevent diseases and keep yourself healthy. What  should I know about diet, weight, and exercise? Eat a healthy diet   Eat a diet that includes plenty of vegetables, fruits, low-fat dairy products, and lean protein.  Do not eat a lot of foods that are high in solid fats, added sugars, or sodium. Maintain a healthy weight Body mass index (BMI) is a measurement that can be used to identify possible weight problems. It estimates body fat based on height and weight. Your health care provider can help determine your BMI and help you achieve or maintain a healthy weight. Get regular exercise Get regular exercise. This is one of the most important things you can do for your health. Most adults should:  Exercise for at least 150 minutes each week. The exercise should increase your heart rate and make you sweat (moderate-intensity exercise).  Do strengthening exercises at least twice a week. This is in addition to the moderate-intensity exercise.  Spend less time sitting. Even light physical activity can be beneficial. Watch cholesterol and blood lipids Have your blood tested for lipids and cholesterol at 57 years of age, then have this test every 5 years. You may need to have your cholesterol levels checked more often if:  Your lipid or cholesterol levels are high.  You are older than 57 years of age.  You are at high risk for heart disease. What should I know about cancer screening? Many types of cancers can be detected early and may often be prevented. Depending on your health history and family history, you may need to have cancer screening at various ages. This may include screening for:  Colorectal cancer.  Prostate cancer.  Skin cancer.  Lung cancer. What should I know about heart disease, diabetes, and high blood pressure? Blood pressure and heart disease  High blood pressure causes heart disease and increases the risk of stroke. This is more likely to develop in people who have high blood pressure readings, are of African  descent, or are overweight.  Talk with your health care provider about your target blood pressure readings.  Have your blood pressure checked: ? Every 3-5 years if you are 40-54 years of age. ? Every year if you are 55 years old or older.  If you are between the ages of 18 and 67 and are a current or former smoker, ask your health care provider if you should have a one-time screening for abdominal aortic aneurysm (AAA). Diabetes Have regular diabetes screenings. This checks your fasting blood sugar level. Have the screening done:  Once every three years after age 4 if you are at a normal weight and have a low risk for diabetes.  More often and at a younger age if you are overweight or have a high risk for diabetes. What should I know about preventing infection? Hepatitis B If you have a higher risk for hepatitis B, you should be screened for this virus. Talk with your health care provider to find out if you are at risk for hepatitis B infection. Hepatitis C Blood testing is recommended for:  Everyone born from [birth year] through 1965.  Anyone with known risk factors for hepatitis C. Sexually transmitted infections (STIs)  You should be screened each year for STIs, including gonorrhea and chlamydia, if: ? You are sexually active and are younger than 57 years of age. ? You are older than 57 years of age and your health care  provider tells you that you are at risk for this type of infection. ? Your sexual activity has changed since you were last screened, and you are at increased risk for chlamydia or gonorrhea. Ask your health care provider if you are at risk.  Ask your health care provider about whether you are at high risk for HIV. Your health care provider may recommend a prescription medicine to help prevent HIV infection. If you choose to take medicine to prevent HIV, you should first get tested for HIV. You should then be tested every 3 months for as long as you are taking the  medicine. Follow these instructions at home: Lifestyle  Do not use any products that contain nicotine or tobacco, such as cigarettes, e-cigarettes, and chewing tobacco. If you need help quitting, ask your health care provider.  Do not use street drugs.  Do not share needles.  Ask your health care provider for help if you need support or information about quitting drugs. Alcohol use  Do not drink alcohol if your health care provider tells you not to drink.  If you drink alcohol: ? Limit how much you have to 0-2 drinks a day. ? Be aware of how much alcohol is in your drink. In the [place] one drink equals one 12 oz bottle of beer (355 mL), one 5 oz glass of wine (148 mL), or one 1 oz glass of hard liquor (44 mL). General instructions  Schedule regular health, dental, and eye exams.  Stay current with your vaccines.  Tell your health care provider if: ? You often feel depressed. ? You have ever been abused or do not feel safe at home. Summary  Adopting a healthy lifestyle and getting preventive care are important in promoting health and wellness.  Follow your health care provider's instructions about healthy diet, exercising, and getting tested or screened for diseases.  Follow your health care provider's instructions on monitoring your cholesterol and blood pressure. This information is not intended to replace advice given to you by your health care provider. Make sure you discuss any questions you have with your health care provider. Document Revised: 10/06/2018 Document Reviewed: 10/06/2018 Elsevier Patient Education  2020 Elsevier Inc.      Agustina Caroli, MD [facility]

## 2020-04-24 ENCOUNTER — Ambulatory Visit: Payer: Managed Care, Other (non HMO) | Admitting: Internal Medicine

## 2020-04-24 ENCOUNTER — Other Ambulatory Visit: Payer: Self-pay | Admitting: Family Medicine

## 2020-04-24 DIAGNOSIS — M5431 Sciatica, right side: Secondary | ICD-10-CM

## 2020-05-02 ENCOUNTER — Other Ambulatory Visit: Payer: Self-pay | Admitting: *Deleted

## 2020-05-02 ENCOUNTER — Other Ambulatory Visit: Payer: Self-pay | Admitting: Emergency Medicine

## 2020-05-02 DIAGNOSIS — R21 Rash and other nonspecific skin eruption: Secondary | ICD-10-CM

## 2020-05-02 NOTE — Telephone Encounter (Signed)
Requested medication (s) are due for refill today - unknown  Requested medication (s) are on the active medication list -yes  Future visit scheduled -no  Last refill: 04/20/20  Notes to clinic: Request RF of medication not assigned protocol  Requested Prescriptions  Pending Prescriptions Disp Refills   clotrimazole-betamethasone (LOTRISONE) cream [Pharmacy Med Name: CLOTRIMAZOLE-BETAMETH 1-0.05% CREAM] 30 g 0    Sig: APPLY ONE APPLICATION TOPICALLY TWO TIMES A DAY      Off-Protocol Failed - 05/02/2020  9:17 AM      Failed - Medication not assigned to a protocol, review manually.      Passed - Valid encounter within last 12 months    Recent Outpatient Visits           2 weeks ago Rash and nonspecific skin eruption   Primary Care at [facility], [place], [place]   3 months ago Sciatica of right side   [facility] at [facility], Lilia Argue, MD   6 months ago Sciatica of right side   [facility] Care at [facility], Fenton Malling, MD   9 months ago Annual physical exam   [facility] at [facility], Lilia Argue, MD   2 years ago Dermatitis   [facility] at [facility], Lilia Argue, MD                  Requested Prescriptions  Pending Prescriptions Disp Refills   clotrimazole-betamethasone ([place]) cream [Pharmacy Med Name: CLOTRIMAZOLE-BETAMETH 1-0.05% CREAM] 30 g 0    Sig: APPLY ONE APPLICATION TOPICALLY TWO TIMES A DAY      Off-Protocol Failed - 05/02/2020  9:17 AM      Failed - Medication not assigned to a protocol, review manually.      Passed - Valid encounter within last 12 months    Recent Outpatient Visits           2 weeks ago Rash and nonspecific skin eruption   [facility] at [facility], [name], MD   3 months ago Sciatica of right side   [facility] at [facility], Lilia Argue, MD   6 months ago Sciatica of right side   Primary Care at [facility], Fenton Malling, MD   9 months ago Annual physical exam   [facility] at [facility], Lilia Argue, MD    2 years ago Dermatitis   [facility] at [facility], Lilia Argue, MD

## 2020-05-18 ENCOUNTER — Other Ambulatory Visit: Payer: Self-pay | Admitting: Internal Medicine

## 2020-05-18 ENCOUNTER — Telehealth: Payer: Self-pay | Admitting: Internal Medicine

## 2020-05-18 MED ORDER — TRULANCE 3 MG PO TABS: 1.0000 | ORAL_TABLET | Freq: Every day | ORAL | 0 refills | Status: DC

## 2020-05-18 NOTE — Telephone Encounter (Signed)
Rx sent 

## 2020-05-28 ENCOUNTER — Ambulatory Visit (INDEPENDENT_AMBULATORY_CARE_PROVIDER_SITE_OTHER): Payer: Managed Care, Other (non HMO)

## 2020-05-28 ENCOUNTER — Ambulatory Visit (INDEPENDENT_AMBULATORY_CARE_PROVIDER_SITE_OTHER): Payer: Managed Care, Other (non HMO) | Admitting: Registered Nurse

## 2020-05-28 ENCOUNTER — Other Ambulatory Visit: Payer: Self-pay

## 2020-05-28 ENCOUNTER — Encounter: Payer: Self-pay | Admitting: Registered Nurse

## 2020-05-28 VITALS — BP 137/81 | HR 75 | Temp 97.6°F | Resp 16 | Ht 65.0 in | Wt 161.0 lb

## 2020-05-28 DIAGNOSIS — M47816 Spondylosis without myelopathy or radiculopathy, lumbar region: Secondary | ICD-10-CM | POA: Diagnosis not present

## 2020-05-28 DIAGNOSIS — R21 Rash and other nonspecific skin eruption: Secondary | ICD-10-CM | POA: Diagnosis not present

## 2020-05-28 DIAGNOSIS — R1310 Dysphagia, unspecified: Secondary | ICD-10-CM | POA: Diagnosis not present

## 2020-05-28 DIAGNOSIS — M5431 Sciatica, right side: Secondary | ICD-10-CM

## 2020-05-28 MED ORDER — CLOTRIMAZOLE-BETAMETHASONE 1-0.05 % EX CREA: TOPICAL_CREAM | CUTANEOUS | 0 refills | Status: DC

## 2020-05-28 MED ORDER — CLINDAMYCIN PHOS-BENZOYL PEROX 1-5 % EX GEL: Freq: Two times a day (BID) | CUTANEOUS | 0 refills | Status: AC

## 2020-05-28 MED ORDER — OMEPRAZOLE 40 MG PO CPDR: 40.0000 mg | DELAYED_RELEASE_CAPSULE | Freq: Every day | ORAL | 10 refills | Status: DC

## 2020-05-28 MED ORDER — METHOCARBAMOL 500 MG PO TABS: ORAL_TABLET | ORAL | 0 refills | Status: DC

## 2020-05-28 MED ORDER — PREDNISONE 20 MG PO TABS: ORAL_TABLET | ORAL | 0 refills | Status: DC

## 2020-05-28 MED ORDER — SUCRALFATE 1 GM/10ML PO SUSP: 1.0000 g | Freq: Three times a day (TID) | ORAL | 0 refills | Status: DC

## 2020-05-28 NOTE — Progress Notes (Signed)
Established Patient Office Visit  Subjective:  Patient ID: [name], male    DOB: 1961-08-07  Age: 60 y.o. MRN: [id number]  CC:  Chief Complaint  Patient presents with   Rash    in the groin areas is still there and cream not helping and patient has another issue with bowel movement/urinating his feet burns [name] WILL EXPLAIN    HPI [name] presents for ongoing rash, ongoing dysphagia, and foot pain  Ongoing rash: had been given lotrisone cream. No effect. Recently took abx for dental infection which seemed to help rash. Curious as to if rash may be bacterial.  Dysphagia: seems to be gradually worsening. No difficulty breathing, though states [name] sometimes gets the feeling like it should be hard to breathe. Has upcoming appt with specialist on 06/11/20 for this.  Foot pain: burning pain on soles of both feet when urinating or having a bowel movement. Of note, had l spine surgery in the past with on L4-L5. No new acute injury. No saddle anesthesia or incontinence. No known history of neuropathies.  Otherwise [name] is feeling ok  Past Medical History:  Diagnosis Date   Anxiety    ARM PAIN, LEFT 11/20/2009   Arthritis    BIPOLAR DISORDER UNSPECIFIED 12/13/2007   patient denies   Blood transfusion without reported diagnosis    CHEST PAIN 03/23/2009   currently having    DEPRESSION 12/13/2007   FATIGUE 03/23/2009   GERD (gastroesophageal reflux disease)    GLAUCOMA 09/03/2009   Headache    HEMORRHOIDS 09/03/2009   HOARSENESS 09/03/2009   HYPERCHOLESTEROLEMIA 03/11/2010   MYALGIA 08/13/2009   NECK PAIN 01/18/2009   Palpitations 03/23/2009   Polydipsia 01/24/2008   Shortness of breath 03/23/2009   THYROID NODULE 08/28/2008   benign on Bx   THYROIDITIS 12/13/2007   Tubular adenoma of colon     Past Surgical History:  Procedure Laterality Date   BACK SURGERY     x 3, lumbar   EYE SURGERY     2008   FINGER SURGERY     TRANSANAL HEMORRHOIDAL  DEARTERIALIZATION N/A 03/07/2016   Procedure: TRANSANAL HEMORRHOIDAL DEARTERIALIZATION HEMORRHOIDAL LIGATION/PEXY EXAM UNDER ANESTHESIA WITH  POSSIBLE HEMORRHOIDECTOMY ;  Surgeon: Michael Boston, MD;  Location: [facility];  Service: General;  Laterality: N/A;    Family History  Problem Relation Age of Onset   Cancer Father 13       colon   Colon cancer Father    Dementia Mother    Hypertension Other     Social History   Socioeconomic History   Marital status: Divorced    Spouse name: Not on file   Number of children: 1   Years of education: Not on file   Highest education level: Not on file  Occupational History   Occupation: Museum/gallery conservator at [employer] Use   Smoking status: Never Smoker   Smokeless tobacco: Never Used  Scientific laboratory technician Use: Never used  Substance and Sexual Activity   Alcohol use: No    Alcohol/week: 0.0 standard drinks   Drug use: No   Sexual activity: Not on file  Other Topics Concern   Not on file  Social History Narrative   [name] lives with his wife in [place], 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       [name] is originally form [place]   Social Determinants of Radio broadcast assistant Strain:    Difficulty of Paying  Living Expenses:   Food Insecurity:    Worried About Charity fundraiser in the Last Year:    Arboriculturist in the Last Year:   Transportation Needs:    Film/video editor (Medical):    Lack of Transportation (Non-Medical):   Physical Activity:    Days of Exercise per Week:    Minutes of Exercise per Session:   Stress:    Feeling of Stress :   Social Connections:    Frequency of Communication with Friends and Family:    Frequency of Social Gatherings with Friends and Family:    Attends Religious Services:    Active Member of Clubs or Organizations:    Attends Music therapist:    Marital Status:   Intimate Partner Violence:    Fear of Current  or Ex-Partner:    Emotionally Abused:    Physically Abused:    Sexually Abused:     Outpatient Medications Prior to Visit  Medication Sig Dispense Refill   atorvastatin (LIPITOR) 20 MG tablet Take 1 tablet (20 mg total) by mouth daily. 90 tablet 2   diclofenac sodium (VOLTAREN) 1 % GEL Apply 2 g topically 4 (four) times daily. 100 g 11   escitalopram (LEXAPRO) 10 MG tablet Take 10 mg by mouth daily.     HYDROcodone-acetaminophen (NORCO) 5-325 MG tablet Take 1 tablet by mouth every 6 (six) hours as needed for moderate pain. 30 tablet 0   mirtazapine (REMERON) 45 MG tablet      Multiple Vitamin (MULTIVITAMIN) tablet Take 1 tablet by mouth daily.       Plecanatide (TRULANCE) 3 MG TABS Take 1 tablet by mouth daily. MUST HAVE OFFICE VISIT FOR FURTHER REFILLS 30 tablet 0   sildenafil (VIAGRA) 100 MG tablet Take 0.5-1 tablets (50-100 mg total) by mouth daily as needed for erectile dysfunction. 30 tablet 3   Travoprost (TRAVATAN OP) Apply to eye at bedtime.     methocarbamol (ROBAXIN) 500 MG tablet TAKE ONE TABLET BY MOUTH EVERY MORNING AND AFTERNOON. TAKE TWO TABLETS BY MOUTH EVERY NIGHT AT BEDTIME AS NEEDED FOR MUSCLE RELAXATION 40 tablet 0   omeprazole (PRILOSEC) 40 MG capsule Take 1 capsule (40 mg total) by mouth daily. 30 capsule 10   fluticasone (FLONASE) 50 MCG/ACT nasal spray Place 1 spray into both nostrils 2 (two) times daily. (Patient not taking: Reported on 05/28/2020) 16 g 6   clotrimazole-betamethasone (LOTRISONE) cream APPLY ONE APPLICATION TOPICALLY TWO TIMES A DAY (Patient not taking: Reported on 05/28/2020) 30 g 0   predniSONE (DELTASONE) 20 MG tablet Take 3 daily for 3 days, then 2 daily for 3 days, then 1 daily for 3 days, then one half daily (Patient not taking: Reported on 04/16/2020) 20 tablet 0   No facility-administered medications prior to visit.    No Known Allergies  ROS Review of Systems Per hpi     Objective:    Physical Exam Vitals and nursing  note reviewed.  Constitutional:      Appearance: [name] is normal weight.  Cardiovascular:     Rate and Rhythm: Normal rate and regular rhythm.  Musculoskeletal:     Cervical back: Normal range of motion. No rigidity or tenderness.  Lymphadenopathy:     Cervical: No cervical adenopathy.  Skin:    General: Skin is warm and dry.     Findings: Rash (hypopigmented rash on inner L thigh, extending onto scrotom) present.  Neurological:     General: No focal  deficit present.     Mental Status: [name] is alert and oriented to person, place, and time. Mental status is at baseline.     Cranial Nerves: No cranial nerve deficit.     Motor: No weakness.     Gait: Gait normal.  Psychiatric:        Mood and Affect: Mood normal.        Behavior: Behavior normal.        Thought Content: Thought content normal.        Judgment: Judgment normal.     BP 137/81    Pulse 75    Temp 97.6 F (36.4 C) (Temporal)    Resp 16    Ht 5\' 5"  (1.651 m)    Wt 161 lb (73 kg)    SpO2 96%    BMI 26.79 kg/m  Wt Readings from Last 3 Encounters:  05/28/20 161 lb (73 kg)  04/16/20 165 lb (74.8 kg)  11/01/19 170 lb (77.1 kg)     Health Maintenance Due  Topic Date Due   INFLUENZA VACCINE  05/27/2020    There are no preventive care reminders to display for this patient.  Lab Results  Component Value Date   TSH 1.930 07/18/2019   Lab Results  Component Value Date   WBC 5.7 05/16/2019   HGB 13.6 05/16/2019   HCT 41.0 05/16/2019   MCV 85.7 05/16/2019   PLT 269.0 05/16/2019   Lab Results  Component Value Date   NA 139 07/18/2019   K 4.1 07/18/2019   CO2 20 07/18/2019   GLUCOSE 97 07/18/2019   BUN 13 07/18/2019   CREATININE 0.96 07/18/2019   BILITOT 0.5 07/18/2019   ALKPHOS 134 (H) 07/18/2019   AST 32 07/18/2019   ALT 36 07/18/2019   PROT 6.7 07/18/2019   ALBUMIN 4.5 07/18/2019   CALCIUM 9.1 07/18/2019   ANIONGAP 6 03/03/2016   GFR 92.51 05/16/2019   Lab Results  Component Value Date   CHOL 155  07/18/2019   Lab Results  Component Value Date   HDL 80 07/18/2019   Lab Results  Component Value Date   LDLCALC 50 07/18/2019   Lab Results  Component Value Date   TRIG 152 (H) 07/18/2019   Lab Results  Component Value Date   CHOLHDL 1.9 07/18/2019   No results found for: HGBA1C    Assessment & Plan:   Problem List Items Addressed This Visit    None    Visit Diagnoses    Dysphagia, unspecified type    -  Primary   Relevant Medications   omeprazole (PRILOSEC) 40 MG capsule   sucralfate (CARAFATE) 1 GM/10ML suspension   Sciatica of right side       Relevant Medications   methocarbamol (ROBAXIN) 500 MG tablet   predniSONE (DELTASONE) 20 MG tablet   Other Relevant Orders   DG Lumbar Spine Complete (Completed)   Osteoarthritis of lumbar spine, unspecified spinal osteoarthritis complication status       Relevant Medications   methocarbamol (ROBAXIN) 500 MG tablet   predniSONE (DELTASONE) 20 MG tablet   Other Relevant Orders   Ambulatory referral to Neurosurgery   Rash and nonspecific skin eruption       Relevant Medications   clindamycin-benzoyl peroxide (BENZACLIN) gel   clotrimazole-betamethasone (LOTRISONE) cream      Meds ordered this encounter  Medications   omeprazole (PRILOSEC) 40 MG capsule    Sig: Take 1 capsule (40 mg total) by mouth daily.  Dispense:  30 capsule    Refill:  10   methocarbamol (ROBAXIN) 500 MG tablet    Sig: TAKE ONE TABLET BY MOUTH EVERY MORNING AND AFTERNOON. TAKE TWO TABLETS BY MOUTH EVERY NIGHT AT BEDTIME AS NEEDED FOR MUSCLE RELAXATION    Dispense:  40 tablet    Refill:  0   predniSONE (DELTASONE) 20 MG tablet    Sig: Take 3 daily for 3 days, then 2 daily for 3 days, then 1 daily for 3 days, then one half daily    Dispense:  20 tablet    Refill:  0    Order Specific Question:   Supervising Provider    Answer:   Carlota Raspberry, JEFFREY R [[id number]]   clindamycin-benzoyl peroxide (BENZACLIN) gel    Sig: Apply topically 2 (two)  times daily.    Dispense:  25 g    Refill:  0    Order Specific Question:   Supervising Provider    Answer:   Carlota Raspberry, JEFFREY R [[id number]]   clotrimazole-betamethasone (LOTRISONE) cream    Sig: APPLY ONE APPLICATION TOPICALLY once daily    Dispense:  30 g    Refill:  0    Order Specific Question:   Supervising Provider    Answer:   Carlota Raspberry, JEFFREY R [[id number]]   sucralfate (CARAFATE) 1 GM/10ML suspension    Sig: Take 10 mLs (1 g total) by mouth 4 (four) times daily -  with meals and at bedtime.    Dispense:  420 mL    Refill:  0    Order Specific Question:   Supervising Provider    Answer:   Carlota Raspberry, JEFFREY R [[id number]]    Follow-up: No follow-ups on file.   PLAN  Refill lotrisone and add clinadmycin-benzoyl peroxide for rash.   Sucralfate tid in addition to omeprazole po qd for dysphagia - follow with specialist as scheduled  DG lumbar spine reveals advancing degenerative changes. Will refer to neurosurgery. reviewed symptoms of spinal stenosis and reasons to present to ED.  Patient encouraged to call clinic with any questions, comments, or concerns.   Maximiano Coss, NP

## 2020-05-28 NOTE — Patient Instructions (Signed)
° ° ° °  If you have lab work done today you will be contacted with your lab results within the next 2 weeks.  If you have not heard from us then please contact us. The fastest way to get your results is to register for My Chart. ° ° °IF you received an x-ray today, you will receive an invoice from [facility]. Please contact [facility] at [phone] with questions or concerns regarding your invoice.  ° °IF you received labwork today, you will receive an invoice from [facility]. Please contact [facility] at [phone] with questions or concerns regarding your invoice.  ° °Our billing staff will not be able to assist you with questions regarding bills from these companies. ° °You will be contacted with the lab results as soon as they are available. The fastest way to get your results is to activate your My Chart account. Instructions are located on the last page of this paperwork. If you have not heard from us regarding the results in 2 weeks, please contact this office. °  ° ° ° °

## 2020-06-01 ENCOUNTER — Ambulatory Visit: Payer: Managed Care, Other (non HMO) | Admitting: Nurse Practitioner

## 2020-06-04 ENCOUNTER — Telehealth: Payer: Self-pay | Admitting: Internal Medicine

## 2020-06-04 MED ORDER — TRULANCE 3 MG PO TABS: 1.0000 | ORAL_TABLET | Freq: Every day | ORAL | 0 refills | Status: DC

## 2020-06-04 NOTE — Telephone Encounter (Signed)
Refill on trulance

## 2020-06-12 ENCOUNTER — Other Ambulatory Visit: Payer: Self-pay | Admitting: Family Medicine

## 2020-07-06 NOTE — Telephone Encounter (Signed)
Patient calling to follow up on request for refill

## 2020-07-08 NOTE — Telephone Encounter (Signed)
Left message advising patient that he does need to keep his 08/15/20 office appointment for refills of Trulance. It has been over 1 year since seeing him in office (was to have 2-3 month follow up) and he has cancelled his last 3 visits with [place].

## 2020-07-16 ENCOUNTER — Ambulatory Visit: Payer: Managed Care, Other (non HMO) | Admitting: Physician Assistant

## 2020-07-16 ENCOUNTER — Telehealth: Payer: Self-pay | Admitting: Internal Medicine

## 2020-08-03 ENCOUNTER — Encounter: Payer: Self-pay | Admitting: Family Medicine

## 2020-08-03 ENCOUNTER — Other Ambulatory Visit: Payer: Self-pay

## 2020-08-03 ENCOUNTER — Ambulatory Visit (INDEPENDENT_AMBULATORY_CARE_PROVIDER_SITE_OTHER): Payer: Managed Care, Other (non HMO) | Admitting: Family Medicine

## 2020-08-03 VITALS — BP 134/79 | HR 73 | Temp 97.9°F | Ht 65.0 in | Wt 165.0 lb

## 2020-08-03 DIAGNOSIS — M5431 Sciatica, right side: Secondary | ICD-10-CM

## 2020-08-03 DIAGNOSIS — Z0001 Encounter for general adult medical examination with abnormal findings: Secondary | ICD-10-CM | POA: Diagnosis not present

## 2020-08-03 DIAGNOSIS — K219 Gastro-esophageal reflux disease without esophagitis: Secondary | ICD-10-CM | POA: Diagnosis not present

## 2020-08-03 DIAGNOSIS — Z125 Encounter for screening for malignant neoplasm of prostate: Secondary | ICD-10-CM

## 2020-08-03 DIAGNOSIS — E041 Nontoxic single thyroid nodule: Secondary | ICD-10-CM

## 2020-08-03 DIAGNOSIS — Z86010 Personal history of colonic polyps: Secondary | ICD-10-CM

## 2020-08-03 DIAGNOSIS — E7800 Pure hypercholesterolemia, unspecified: Secondary | ICD-10-CM

## 2020-08-03 DIAGNOSIS — Z1211 Encounter for screening for malignant neoplasm of colon: Secondary | ICD-10-CM

## 2020-08-03 DIAGNOSIS — Z0000 Encounter for general adult medical examination without abnormal findings: Secondary | ICD-10-CM

## 2020-08-03 MED ORDER — METHOCARBAMOL 500 MG PO TABS: ORAL_TABLET | ORAL | 0 refills | Status: DC

## 2020-08-03 MED ORDER — HYDROCODONE-ACETAMINOPHEN 5-325 MG PO TABS: 1.0000 | ORAL_TABLET | Freq: Four times a day (QID) | ORAL | 0 refills | Status: DC | PRN

## 2020-08-03 MED ORDER — DICLOFENAC SODIUM 1 % EX GEL: 2.0000 g | Freq: Four times a day (QID) | CUTANEOUS | 5 refills | Status: AC

## 2020-08-03 MED ORDER — OMEPRAZOLE 40 MG PO CPDR: 40.0000 mg | DELAYED_RELEASE_CAPSULE | Freq: Every day | ORAL | 10 refills | Status: DC

## 2020-08-03 MED ORDER — ATORVASTATIN CALCIUM 20 MG PO TABS: 20.0000 mg | ORAL_TABLET | Freq: Every day | ORAL | 2 refills | Status: DC

## 2020-08-03 NOTE — Progress Notes (Signed)
10/8/202111:20 AM  [name] February 17, 1961, 59 y.o., male [id number]  Chief Complaint  Patient presents with  . Annual Exam    HPI:   Patient is a 59 y.o. male with past medical history significant for HLP, bipolar disorder, thyroid nodule - benign, thyroiditis, chronic constipation, GERD, ED, colonic polyps, sciatica who presents today for CPE  Last CPE sept 2020  [facility], [facility], Dr Hilarie Fredrickson  He has been having flare up of his right sided sciatica and now having neck pain with numbness/tingling down both arms, sees neurosurg in nov, has h/o lumbar fusion  Health Maintenance  Topic Date Due  . COLONOSCOPY  10/15/2020  . TETANUS/TDAP  04/16/2030  . INFLUENZA VACCINE  Completed  . Hepatitis C Screening  Completed  . HIV Screening  Completed   There are no preventive care reminders to display for this patient.  Depression screen [facility] 2/9 05/28/2020 04/16/2020 01/05/2020  Decreased Interest 0 0 0  Down, Depressed, Hopeless 0 0 0  PHQ - 2 Score 0 0 0    Fall Risk  05/28/2020 04/16/2020 01/05/2020 11/01/2019 07/18/2019  Falls in the past year? 0 0 0 0 0  Number falls in past yr: - - 0 0 0  Injury with Fall? - - 0 0 0  Comment - - - - -  Follow up Falls evaluation completed Falls evaluation completed - Falls evaluation completed Falls evaluation completed     No Known Allergies  Prior to Admission medications   Medication Sig Start Date End Date Taking? Authorizing Provider  atorvastatin (LIPITOR) 20 MG tablet TAKE ONE TABLET BY MOUTH DAILY 06/12/20  Yes Rutherford Guys, MD  clindamycin-benzoyl peroxide [facility]) gel Apply topically 2 (two) times daily. 05/28/20  Yes Maximiano Coss, NP  clotrimazole-betamethasone (LOTRISONE) cream APPLY ONE APPLICATION TOPICALLY once daily 05/28/20  Yes Maximiano Coss, NP  diclofenac sodium (VOLTAREN) 1 % GEL Apply 2 g topically 4 (four) times daily. 07/12/18  Yes Renato Shin, MD  escitalopram (LEXAPRO) 10 MG tablet Take 10 mg by mouth daily.    Yes [provider]  fluticasone (FLONASE) 50 MCG/ACT nasal spray Place 1 spray into both nostrils 2 (two) times daily. 01/05/20  Yes Rutherford Guys, MD  HYDROcodone-acetaminophen (NORCO) 5-325 MG tablet Take 1 tablet by mouth every 6 (six) hours as needed for moderate pain. 01/05/20  Yes Rutherford Guys, MD  methocarbamol (ROBAXIN) 500 MG tablet TAKE ONE TABLET BY MOUTH EVERY MORNING AND AFTERNOON. TAKE TWO TABLETS BY MOUTH EVERY NIGHT AT BEDTIME AS NEEDED FOR MUSCLE RELAXATION 05/28/20  Yes Maximiano Coss, NP  mirtazapine (REMERON) 45 MG tablet  06/02/17  Yes [provider]  Multiple Vitamin (MULTIVITAMIN) tablet Take 1 tablet by mouth daily.     Yes [provider]  omeprazole (PRILOSEC) 40 MG capsule Take 1 capsule (40 mg total) by mouth daily. 05/28/20  Yes Maximiano Coss, NP  Plecanatide (TRULANCE) 3 MG TABS Take 1 tablet by mouth daily. MUST KEEP 07/2020 OFFICE VISIT FOR FURTHER REFILLS 06/04/20  Yes Pyrtle, Lajuan Lines, MD  sildenafil (VIAGRA) 100 MG tablet Take 0.5-1 tablets (50-100 mg total) by mouth daily as needed for erectile dysfunction. 04/16/20  Yes Sagardia, [name], MD  Travoprost (TRAVATAN OP) Apply to eye at bedtime.   Yes [provider]    Past Medical History:  Diagnosis Date  . Anxiety   . ARM PAIN, LEFT 11/20/2009  . Arthritis   . BIPOLAR DISORDER UNSPECIFIED 12/13/2007   patient  denies  . Blood transfusion without reported diagnosis   . CHEST PAIN 03/23/2009   currently having   . DEPRESSION 12/13/2007  . FATIGUE 03/23/2009  . GERD (gastroesophageal reflux disease)   . GLAUCOMA 09/03/2009  . Headache   . HEMORRHOIDS 09/03/2009  . HOARSENESS 09/03/2009  . HYPERCHOLESTEROLEMIA 03/11/2010  . MYALGIA 08/13/2009  . NECK PAIN 01/18/2009  . Palpitations 03/23/2009  . Polydipsia 01/24/2008  . Shortness of breath 03/23/2009  . THYROID NODULE 08/28/2008   benign on Bx  . THYROIDITIS 12/13/2007  . Tubular adenoma of colon     Past Surgical  History:  Procedure Laterality Date  . BACK SURGERY     x 3, lumbar  . EYE SURGERY     2008  . FINGER SURGERY    . TRANSANAL HEMORRHOIDAL DEARTERIALIZATION N/A 03/07/2016   Procedure: TRANSANAL HEMORRHOIDAL DEARTERIALIZATION HEMORRHOIDAL LIGATION/PEXY EXAM UNDER ANESTHESIA WITH  POSSIBLE HEMORRHOIDECTOMY ;  Surgeon: Michael Boston, MD;  Location: [facility];  Service: General;  Laterality: N/A;    Social History   Tobacco Use  . Smoking status: Never Smoker  . Smokeless tobacco: Never Used  Substance Use Topics  . Alcohol use: No    Alcohol/week: 0.0 standard drinks    Family History  Problem Relation Age of Onset  . Cancer Father 35       colon  . Colon cancer Father   . Dementia Mother   . Hypertension Other     Review of Systems  Constitutional: Negative for chills, diaphoresis, fever and malaise/fatigue.  HENT: Positive for congestion. Negative for hearing loss, sore throat and tinnitus.   Eyes: Negative for blurred vision and double vision.  Respiratory: Negative for cough and shortness of breath.   Cardiovascular: Negative for chest pain, palpitations and leg swelling.  Gastrointestinal: Positive for constipation and heartburn. Negative for abdominal pain, blood in stool, melena, nausea and vomiting.  Genitourinary: Negative for dysuria and hematuria.  Musculoskeletal: Positive for back pain and neck pain.  Neurological: Positive for tingling. Negative for dizziness, focal weakness and headaches.  Endo/Heme/Allergies: Negative for polydipsia.     OBJECTIVE:  Today's Vitals   08/03/20 1059  BP: 134/79  Pulse: 73  Temp: 97.9 F (36.6 C)  SpO2: 97%  Weight: 165 lb (74.8 kg)  Height: '5\' 5"'  (1.651 m)   Body mass index is 27.46 kg/m.   Hearing Screening   '125Hz'  '250Hz'  '500Hz'  '1000Hz'  '2000Hz'  '3000Hz'  '4000Hz'  '6000Hz'  '8000Hz'   Right ear:           Left ear:           Vision Screening Comments: Pt has glaucoma cannot complete vision test. See's eye doc twice a  yr   Physical Exam Vitals and nursing note reviewed.  Constitutional:      Appearance: He is well-developed.  HENT:     Head: Normocephalic and atraumatic.     Right Ear: Hearing, tympanic membrane, ear canal and external ear normal.     Left Ear: Hearing, tympanic membrane, ear canal and external ear normal.     Mouth/Throat:     Pharynx: No oropharyngeal exudate.  Eyes:     Extraocular Movements: Extraocular movements intact.     Conjunctiva/sclera: Conjunctivae normal.     Pupils: Pupils are equal, round, and reactive to light.  Neck:     Thyroid: No thyromegaly.  Cardiovascular:     Rate and Rhythm: Normal rate and regular rhythm.     Heart sounds: Normal heart sounds. No murmur  heard.  No friction rub. No gallop.   Pulmonary:     Effort: Pulmonary effort is normal.     Breath sounds: Normal breath sounds. No wheezing, rhonchi or rales.  Abdominal:     General: Bowel sounds are normal. There is no distension.     Palpations: Abdomen is soft. There is no mass.     Tenderness: There is no abdominal tenderness.  Musculoskeletal:        General: Normal range of motion.     Cervical back: Neck supple.     Right lower leg: No edema.     Left lower leg: No edema.  Lymphadenopathy:     Cervical: No cervical adenopathy.  Skin:    General: Skin is warm and dry.  Neurological:     Mental Status: He is alert and oriented to person, place, and time.     Cranial Nerves: No cranial nerve deficit.     Coordination: Coordination normal.     Gait: Gait normal.     Deep Tendon Reflexes: Reflexes are normal and symmetric.  Psychiatric:        Mood and Affect: Mood normal.        Behavior: Behavior normal.     No results found for this or any previous visit (from the past 24 hour(s)).  No results found.   ASSESSMENT and PLAN  1. Annual physical exam Routine HCM labs ordered. HCM reviewed/discussed. Anticipatory guidance regarding healthy weight, lifestyle and choices given.    2. Sciatica of right side Worsening, has upcoming appt with neurosurg .takes meds prn flare ups. pmp reviewed - HYDROcodone-acetaminophen (NORCO) 5-325 MG tablet; Take 1 tablet by mouth every 6 (six) hours as needed for moderate pain. - methocarbamol (ROBAXIN) 500 MG tablet; TAKE ONE TABLET BY MOUTH EVERY MORNING AND AFTERNOON. TAKE TWO TABLETS BY MOUTH EVERY NIGHT AT BEDTIME AS NEEDED FOR MUSCLE RELAXATION  3. Gastroesophageal reflux disease without esophagitis Has upcoming appt with GI, overall stable. - omeprazole (PRILOSEC) 40 MG capsule; Take 1 capsule (40 mg total) by mouth daily.  4. HYPERCHOLESTEROLEMIA Checking labs today, medications will be adjusted as needed.  - Lipid panel - CMP14+EGFR  5. THYROID NODULE - TSH  6. Screening for prostate cancer - PSA  7. Colon cancer screening 8. Personal history of colonic polyps - Ambulatory referral to Gastroenterology  Other orders - atorvastatin (LIPITOR) 20 MG tablet; Take 1 tablet (20 mg total) by mouth daily. - diclofenac Sodium (VOLTAREN) 1 % GEL; Apply 2 g topically 4 (four) times daily.  Return in about 6 months (around 02/01/2021) for [facility] - Dr Carlota Raspberry.    Rutherford Guys, MD Primary [facility] at [place], [place] [address] Ph.  [phone] Fax [phone]

## 2020-08-03 NOTE — Patient Instructions (Addendum)
   If you have lab work done today you will be contacted with your lab results within the next 2 weeks.  If you have not heard from us then please contact us. The fastest way to get your results is to register for My Chart.   IF you received an x-ray today, you will receive an invoice from [facility]. Please contact [facility] at [phone] with questions or concerns regarding your invoice.   IF you received labwork today, you will receive an invoice from [facility]. Please contact [facility] at [phone] with questions or concerns regarding your invoice.   Our billing staff will not be able to assist you with questions regarding bills from these companies.  You will be contacted with the lab results as soon as they are available. The fastest way to get your results is to activate your My Chart account. Instructions are located on the last page of this paperwork. If you have not heard from us regarding the results in 2 weeks, please contact this office.     Preventive Care 40-64 Years Old, Male Preventive care refers to lifestyle choices and visits with your health care provider that can promote health and wellness. This includes:  A yearly physical exam. This is also called an annual well check.  Regular dental and eye exams.  Immunizations.  Screening for certain conditions.  Healthy lifestyle choices, such as eating a healthy diet, getting regular exercise, not using drugs or products that contain nicotine and tobacco, and limiting alcohol use. What can I expect for my preventive care visit? Physical exam Your health care provider will check:  Height and weight. These may be used to calculate body mass index (BMI), which is a measurement that tells if you are at a healthy weight.  Heart rate and blood pressure.  Your skin for abnormal spots. Counseling Your health care provider may ask you questions about:  Alcohol, tobacco, and drug use.  Emotional  well-being.  Home and relationship well-being.  Sexual activity.  Eating habits.  Work and work environment. What immunizations do I need?  Influenza (flu) vaccine  This is recommended every year. Tetanus, diphtheria, and pertussis (Tdap) vaccine  You may need a Td booster every 10 years. Varicella (chickenpox) vaccine  You may need this vaccine if you have not already been vaccinated. Zoster (shingles) vaccine  You may need this after age 60. Measles, mumps, and rubella (MMR) vaccine  You may need at least one dose of MMR if you were born in 1957 or later. You may also need a second dose. Pneumococcal conjugate (PCV13) vaccine  You may need this if you have certain conditions and were not previously vaccinated. Pneumococcal polysaccharide (PPSV23) vaccine  You may need one or two doses if you smoke cigarettes or if you have certain conditions. Meningococcal conjugate (MenACWY) vaccine  You may need this if you have certain conditions. Hepatitis A vaccine  You may need this if you have certain conditions or if you travel or work in places where you may be exposed to hepatitis A. Hepatitis B vaccine  You may need this if you have certain conditions or if you travel or work in places where you may be exposed to hepatitis B. Haemophilus influenzae type b (Hib) vaccine  You may need this if you have certain risk factors. Human papillomavirus (HPV) vaccine  If recommended by your health care provider, you may need three doses over 6 months. You may receive vaccines as individual doses   or as more than one vaccine together in one shot (combination vaccines). Talk with your health care provider about the risks and benefits of combination vaccines. What tests do I need? Blood tests  Lipid and cholesterol levels. These may be checked every 5 years, or more frequently if you are over 62 years old.  Hepatitis C test.  Hepatitis B test. Screening  Lung cancer screening.  You may have this screening every year starting at age 62 if you have a 30-pack-year history of smoking and currently smoke or have quit within the past 15 years.  Prostate cancer screening. Recommendations will vary depending on your family history and other risks.  Colorectal cancer screening. All adults should have this screening starting at age 63 and continuing until age 34. Your health care provider may recommend screening at age 43 if you are at increased risk. You will have tests every 1-10 years, depending on your results and the type of screening test.  Diabetes screening. This is done by checking your blood sugar (glucose) after you have not eaten for a while (fasting). You may have this done every 1-3 years.  Sexually transmitted disease (STD) testing. Follow these instructions at home: Eating and drinking  Eat a diet that includes fresh fruits and vegetables, whole grains, lean protein, and low-fat dairy products.  Take vitamin and mineral supplements as recommended by your health care provider.  Do not drink alcohol if your health care provider tells you not to drink.  If you drink alcohol: ? Limit how much you have to 0-2 drinks a day. ? Be aware of how much alcohol is in your drink. In the [place] one drink equals one 12 oz bottle of beer (355 mL), one 5 oz glass of wine (148 mL), or one 1 oz glass of hard liquor (44 mL). Lifestyle  Take daily care of your teeth and gums.  Stay active. Exercise for at least 30 minutes on 5 or more days each week.  Do not use any products that contain nicotine or tobacco, such as cigarettes, e-cigarettes, and chewing tobacco. If you need help quitting, ask your health care provider.  If you are sexually active, practice safe sex. Use a condom or other form of protection to prevent STIs (sexually transmitted infections).  Talk with your health care provider about taking a low-dose aspirin every day starting at age 70. What's next?  Go  to your health care provider once a year for a well check visit.  Ask your health care provider how often you should have your eyes and teeth checked.  Stay up to date on all vaccines. This information is not intended to replace advice given to you by your health care provider. Make sure you discuss any questions you have with your health care provider. Document Revised: 10/07/2018 Document Reviewed: 10/07/2018 Elsevier Patient Education  2020 [employer].

## 2020-08-04 LAB — CMP14+EGFR
ALT: 35 IU/L (ref 0–44)
AST: 29 IU/L (ref 0–40)
Albumin/Globulin Ratio: 1.8 (ref 1.2–2.2)
Albumin: 4.6 g/dL (ref 3.8–4.9)
Alkaline Phosphatase: 129 IU/L — ABNORMAL HIGH (ref 44–121)
BUN/Creatinine Ratio: 11 (ref 9–20)
BUN: 10 mg/dL (ref 6–24)
Bilirubin Total: 0.8 mg/dL (ref 0.0–1.2)
CO2: 25 mmol/L (ref 20–29)
Calcium: 9.8 mg/dL (ref 8.7–10.2)
Chloride: 100 mmol/L (ref 96–106)
Creatinine, Ser: 0.89 mg/dL (ref 0.76–1.27)
GFR calc Af Amer: 110 mL/min/{1.73_m2} (ref >59)
GFR calc non Af Amer: 95 mL/min/{1.73_m2} (ref >59)
Globulin, Total: 2.6 g/dL (ref 1.5–4.5)
Glucose: 84 mg/dL (ref 65–99)
Potassium: 4.5 mmol/L (ref 3.5–5.2)
Sodium: 139 mmol/L (ref 134–144)
Total Protein: 7.2 g/dL (ref 6.0–8.5)

## 2020-08-04 LAB — LIPID PANEL
Chol/HDL Ratio: 2.2 ratio (ref 0.0–5.0)
Cholesterol, Total: 182 mg/dL (ref 100–199)
HDL: 82 mg/dL (ref >39)
LDL Chol Calc (NIH): 73 mg/dL (ref 0–99)
Triglycerides: 165 mg/dL — ABNORMAL HIGH (ref 0–149)
VLDL Cholesterol Cal: 27 mg/dL (ref 5–40)

## 2020-08-04 LAB — TSH: TSH: 1.27 u[IU]/mL (ref 0.450–4.500)

## 2020-08-04 LAB — PSA: Prostate Specific Ag, Serum: 0.8 ng/mL (ref 0.0–4.0)

## 2020-08-15 ENCOUNTER — Ambulatory Visit (INDEPENDENT_AMBULATORY_CARE_PROVIDER_SITE_OTHER): Payer: Managed Care, Other (non HMO) | Admitting: Internal Medicine

## 2020-08-15 ENCOUNTER — Encounter: Payer: Self-pay | Admitting: Internal Medicine

## 2020-08-15 VITALS — BP 124/74 | HR 65 | Ht 65.0 in | Wt 171.0 lb

## 2020-08-15 DIAGNOSIS — Z860100 Personal history of colon polyps, unspecified: Secondary | ICD-10-CM

## 2020-08-15 DIAGNOSIS — K219 Gastro-esophageal reflux disease without esophagitis: Secondary | ICD-10-CM

## 2020-08-15 DIAGNOSIS — K5904 Chronic idiopathic constipation: Secondary | ICD-10-CM

## 2020-08-15 DIAGNOSIS — R140 Abdominal distension (gaseous): Secondary | ICD-10-CM

## 2020-08-15 MED ORDER — SUPREP BOWEL PREP KIT 17.5-3.13-1.6 GM/177ML PO SOLN: 1.0000 | ORAL | 0 refills | Status: DC

## 2020-08-15 MED ORDER — TRULANCE 3 MG PO TABS: 1.0000 | ORAL_TABLET | Freq: Every day | ORAL | 0 refills | Status: DC

## 2020-08-15 NOTE — Patient Instructions (Signed)
You have been scheduled for a colonoscopy. Please follow written instructions given to you at your visit today.  Please pick up your prep supplies at the pharmacy within the next 1-3 days. If you use inhalers (even only as needed), please bring them with you on the day of your procedure.  We have sent the following medications to your pharmacy for you to pick up at your convenience: Trulance  If you are age 57 or younger, your body mass index should be between 19-25. Your Body mass index is 28.46 kg/m. If this is out of the aformentioned range listed, please consider follow up with your Primary Care Provider.   Due to recent changes in healthcare laws, you may see the results of your imaging and laboratory studies on MyChart before your provider has had a chance to review them.  We understand that in some cases there may be results that are confusing or concerning to you. Not all laboratory results come back in the same time frame and the provider may be waiting for multiple results in order to interpret others.  Please give [place] 48 hours in order for your provider to thoroughly review all the results before contacting the office for clarification of your results.

## 2020-08-15 NOTE — Progress Notes (Signed)
   Subjective:    Patient ID: [name], male    DOB: Aug 03, 1961, 59 y.o.   MRN: [id number]  HPI [name] is a 59 year old male with a past medical history of chronic constipation, history of colonic adenoma, internal hemorrhoids with prior hemorrhoidectomy in May 2017, GERD who is here for follow-up.  He was last seen in July 2020.  His last colonoscopy was December 2016, with a 5-year recall.  We reviewed colonoscopy results today.  He reports that he had been taking Trulance 3 mg daily which was working very well for his chronic constipation but also his abdominal bloating symptom.  He ran out of this medication and so for 6 weeks or so was rationing this and taking it only about every other day.  When he reduced frequency of this medicine and now that he is off it entirely he is having issues with abdominal bloating as well as constipation.  He is also had some perianal irritation and what he believes is a fissure.  He denies blood in stool or melena.  His reflux is well controlled on omeprazole 40 mg daily.  No dysphagia or odynophagia.   Review of Systems As per HPI, otherwise negative  Current Medications, Allergies, Past Medical History, Past Surgical History, Family History and Social History were reviewed in [employer] record.     Objective:   Physical Exam BP 124/74   Pulse 65   Ht 5\' 5"  (1.651 m)   Wt 171 lb (77.6 kg)   BMI 28.46 kg/m  Gen: awake, alert, NAD HEENT: anicteric, op clear CV: RRR, no mrg Pulm: CTA b/l Abd: soft, NT/ND, +BS throughout Ext: no c/c/e Neuro: nonfocal      Assessment & Plan:  59 year old male with a past medical history of chronic constipation, history of colonic adenoma, internal hemorrhoids with prior hemorrhoidectomy in May 2017, GERD who is here for follow-up.  1.  Chronic constipation --symptoms had improved with Trulance but this medication ran out.  We will resume 3 mg daily, #90 with refills  2.  GERD  --well-controlled on omeprazole 40 mg daily.  Upper endoscopy recommended for Barrett's screening.  We discussed the risk, benefits and alternatives and he is agreeable and wishes to proceed  3.  History of adenomatous colon polyp --surveillance colonoscopy recommended.  We discussed the risk, benefits and alternatives and he is agreeable and wishes to proceed

## 2020-10-01 ENCOUNTER — Encounter: Payer: Self-pay | Admitting: Internal Medicine

## 2020-10-01 ENCOUNTER — Ambulatory Visit (AMBULATORY_SURGERY_CENTER): Payer: Managed Care, Other (non HMO) | Admitting: Internal Medicine

## 2020-10-01 ENCOUNTER — Other Ambulatory Visit: Payer: Self-pay

## 2020-10-01 VITALS — BP 130/85 | HR 71 | Temp 97.5°F | Resp 21 | Ht 65.0 in | Wt 171.0 lb

## 2020-10-01 DIAGNOSIS — K317 Polyp of stomach and duodenum: Secondary | ICD-10-CM | POA: Diagnosis not present

## 2020-10-01 DIAGNOSIS — K2970 Gastritis, unspecified, without bleeding: Secondary | ICD-10-CM | POA: Diagnosis not present

## 2020-10-01 DIAGNOSIS — D122 Benign neoplasm of ascending colon: Secondary | ICD-10-CM | POA: Diagnosis not present

## 2020-10-01 DIAGNOSIS — K219 Gastro-esophageal reflux disease without esophagitis: Secondary | ICD-10-CM | POA: Diagnosis not present

## 2020-10-01 DIAGNOSIS — D120 Benign neoplasm of cecum: Secondary | ICD-10-CM | POA: Diagnosis not present

## 2020-10-01 DIAGNOSIS — D128 Benign neoplasm of rectum: Secondary | ICD-10-CM | POA: Diagnosis not present

## 2020-10-01 DIAGNOSIS — Z86010 Personal history of colonic polyps: Secondary | ICD-10-CM | POA: Diagnosis not present

## 2020-10-01 DIAGNOSIS — K229 Disease of esophagus, unspecified: Secondary | ICD-10-CM

## 2020-10-01 MED ORDER — SODIUM CHLORIDE 0.9 % IV SOLN: 500.0000 mL | Freq: Once | INTRAVENOUS | Status: DC

## 2020-10-01 NOTE — Op Note (Signed)
[place] Patient Name: [name] Procedure Date: 10/01/2020 3:02 PM MRN: [id number] Endoscopist: Jerene Bears , MD Age: 59 Referring MD:  Date of Birth: 10-23-1961 Gender: Male Account #: [id number] Procedure:                Upper GI endoscopy Indications:              Gastro-esophageal reflux disease Medicines:                Monitored Anesthesia Care Procedure:                Pre-Anesthesia Assessment:                           - Prior to the procedure, a History and Physical                            was performed, and patient medications and                            allergies were reviewed. The patient's tolerance of                            previous anesthesia was also reviewed. The risks                            and benefits of the procedure and the sedation                            options and risks were discussed with the patient.                            All questions were answered, and informed consent                            was obtained. Prior Anticoagulants: The patient has                            taken no previous anticoagulant or antiplatelet                            agents. ASA Grade Assessment: II - A patient with                            mild systemic disease. After reviewing the risks                            and benefits, the patient was deemed in                            satisfactory condition to undergo the procedure.                           After obtaining informed consent, the endoscope was  passed under direct vision. Throughout the                            procedure, the patient's blood pressure, pulse, and                            oxygen saturations were monitored continuously. The                            Endoscope was introduced through the mouth, and                            advanced to the second part of duodenum. The upper                            GI endoscopy was  accomplished without difficulty.                            The patient tolerated the procedure well. Scope In: Scope Out: Findings:                 One tongue of salmon-colored mucosa was present at                            38 cm. The maximum longitudinal extent of these                            esophageal mucosal changes was 1 cm in length.                            Biopsies were taken with a cold forceps for                            histology.                           The exam of the esophagus was otherwise normal.                           A single 6 mm sessile polyp was found in the                            gastric body. The polyp was removed with a hot                            snare. Resection and retrieval were complete.                           Mild inflammation characterized by erythema was                            found in the gastric body. Biopsies were taken with  a cold forceps for histology and Helicobacter                            pylori testing.                           The examined duodenum was normal. Complications:            No immediate complications. Estimated Blood Loss:     Estimated blood loss was minimal. Impression:               - Salmon-colored mucosa suspicious for                            short-segment Barrett's esophagus. Biopsied.                           - A single gastric polyp. Resected and retrieved.                           - Gastritis. Biopsied.                           - Normal examined duodenum. Recommendation:           - Patient has a contact number available for                            emergencies. The signs and symptoms of potential                            delayed complications were discussed with the                            patient. Return to normal activities tomorrow.                            Written discharge instructions were provided to the                            patient.                            - Resume previous diet.                           - Continue present medications.                           - Await pathology results. Jerene Bears, MD 10/01/2020 3:50:25 PM This report has been signed electronically.

## 2020-10-01 NOTE — Op Note (Signed)
[name] Procedure Date: 10/01/2020 3:01 PM MRN: [id number] Endoscopist: Jerene Bears , MD Age: 59 Referring MD:  Date of Birth: Jun 24, 1961 Gender: Male Account #: [id number] Procedure:                Colonoscopy Indications:              High risk colon cancer surveillance: Personal                            history of non-advanced adenoma, Last colonoscopy:                            December 2016 Medicines:                Monitored Anesthesia Care Procedure:                Pre-Anesthesia Assessment:                           - Prior to the procedure, a History and Physical                            was performed, and patient medications and                            allergies were reviewed. The patient's tolerance of                            previous anesthesia was also reviewed. The risks                            and benefits of the procedure and the sedation                            options and risks were discussed with the patient.                            All questions were answered, and informed consent                            was obtained. Prior Anticoagulants: The patient has                            taken no previous anticoagulant or antiplatelet                            agents. ASA Grade Assessment: II - A patient with                            mild systemic disease. After reviewing the risks                            and benefits, the patient was deemed in  satisfactory condition to undergo the procedure.                           After obtaining informed consent, the colonoscope                            was passed under direct vision. Throughout the                            procedure, the patient's blood pressure, pulse, and                            oxygen saturations were monitored continuously. The                            Colonoscope was introduced through the anus and                             advanced to the cecum, identified by appendiceal                            orifice and ileocecal valve. The colonoscopy was                            performed without difficulty. The patient tolerated                            the procedure well. The quality of the bowel                            preparation was good. The ileocecal valve,                            appendiceal orifice, and rectum were photographed. Scope In: 3:27:11 PM Scope Out: 3:45:53 PM Scope Withdrawal Time: 0 hours 15 minutes 6 seconds  Total Procedure Duration: 0 hours 18 minutes 42 seconds  Findings:                 The digital rectal exam was normal.                           A 2 mm polyp was found in the cecum. The polyp was                            sessile. The polyp was removed with a cold biopsy                            forceps. Resection and retrieval were complete.                           Three sessile polyps were found in the ascending                            colon. The polyps were 3  to 5 mm in size. These                            polyps were removed with a cold snare. Resection                            and retrieval were complete.                           A 4 mm polyp was found in the rectum. The polyp was                            sessile. The polyp was removed with a cold snare.                            Resection and retrieval were complete.                           Scarring from prior hemorrhoidectomy in the distal                            rectum. No additional abnormalities were found on                            retroflexion. Complications:            No immediate complications. Estimated Blood Loss:     Estimated blood loss was minimal. Impression:               - One 2 mm polyp in the cecum, removed with a cold                            biopsy forceps. Resected and retrieved.                           - Three 3 to 5 mm polyps in the ascending colon,                             removed with a cold snare. Resected and retrieved.                           - One 4 mm polyp in the rectum, removed with a cold                            snare. Resected and retrieved. Recommendation:           - Patient has a contact number available for                            emergencies. The signs and symptoms of potential                            delayed complications were discussed with the  patient. Return to normal activities tomorrow.                            Written discharge instructions were provided to the                            patient.                           - Resume previous diet.                           - Continue present medications.                           - Lotrisone cream applied twice daily to perianal                            skin x 1 week for itching.                           - Await pathology results.                           - Repeat colonoscopy is recommended for                            surveillance. The colonoscopy date will be                            determined after pathology results from today's                            exam become available for review. Jerene Bears, MD 10/01/2020 3:54:23 PM This report has been signed electronically.

## 2020-10-01 NOTE — Progress Notes (Signed)
To PACU, VSS. Report to [email or web address] 

## 2020-10-01 NOTE — Patient Instructions (Signed)
Discharge instructions given. Handouts on Gastritis and polyps. Resume previous medications. lotrisone cream applied twice daily to perianal skin times 1 week for itching. YOU HAD AN ENDOSCOPIC PROCEDURE TODAY AT [facility]:   Refer to the procedure report that was given to you for any specific questions about what was found during the examination.  If the procedure report does not answer your questions, please call your gastroenterologist to clarify.  If you requested that your care partner not be given the details of your procedure findings, then the procedure report has been included in a sealed envelope for you to review at your convenience later.  YOU SHOULD EXPECT: Some feelings of bloating in the abdomen. Passage of more gas than usual.  Walking can help get rid of the air that was put into your GI tract during the procedure and reduce the bloating. If you had a lower endoscopy (such as a colonoscopy or flexible sigmoidoscopy) you may notice spotting of blood in your stool or on the toilet paper. If you underwent a bowel prep for your procedure, you may not have a normal bowel movement for a few days.  Please Note:  You might notice some irritation and congestion in your nose or some drainage.  This is from the oxygen used during your procedure.  There is no need for concern and it should clear up in a day or so.  SYMPTOMS TO REPORT IMMEDIATELY:   Following lower endoscopy (colonoscopy or flexible sigmoidoscopy):  Excessive amounts of blood in the stool  Significant tenderness or worsening of abdominal pains  Swelling of the abdomen that is new, acute  Fever of 100F or higher   Following upper endoscopy (EGD)  Vomiting of blood or coffee ground material  New chest pain or pain under the shoulder blades  Painful or persistently difficult swallowing  New shortness of breath  Fever of 100F or higher  Black, tarry-looking stools  For urgent or emergent issues, a  gastroenterologist can be reached at any hour by calling [phone]. Do not use MyChart messaging for urgent concerns.    DIET:  We do recommend a small meal at first, but then you may proceed to your regular diet.  Drink plenty of fluids but you should avoid alcoholic beverages for 24 hours.  ACTIVITY:  You should plan to take it easy for the rest of today and you should NOT DRIVE or use heavy machinery until tomorrow (because of the sedation medicines used during the test).    FOLLOW UP: Our staff will call the number listed on your records 48-72 hours following your procedure to check on you and address any questions or concerns that you may have regarding the information given to you following your procedure. If we do not reach you, we will leave a message.  We will attempt to reach you two times.  During this call, we will ask if you have developed any symptoms of COVID 19. If you develop any symptoms (ie: fever, flu-like symptoms, shortness of breath, cough etc.) before then, please call [phone].  If you test positive for Covid 19 in the 2 weeks post procedure, please call and report this information to [place].    If any biopsies were taken you will be contacted by phone or by letter within the next 1-3 weeks.  Please call us at [phone] if you have not heard about the biopsies in 3 weeks.    SIGNATURES/CONFIDENTIALITY: You and/or your care partner have  signed paperwork which will be entered into your electronic medical record.  These signatures attest to the fact that that the information above on your After Visit Summary has been reviewed and is understood.  Full responsibility of the confidentiality of this discharge information lies with you and/or your care-partner.

## 2020-10-01 NOTE — Progress Notes (Signed)
Pt's states no medical or surgical changes since previsit or office visit. 

## 2020-10-01 NOTE — Progress Notes (Signed)
Called to room to assist during endoscopic procedure.  Patient ID and intended procedure confirmed with present staff. Received instructions for my participation in the procedure from the performing physician.  

## 2020-10-03 ENCOUNTER — Telehealth: Payer: Self-pay

## 2020-10-03 ENCOUNTER — Telehealth: Payer: Self-pay | Admitting: *Deleted

## 2020-10-03 NOTE — Telephone Encounter (Signed)
Called [phone] and left a message we tried to reach pt for a follow up call. maw

## 2020-10-03 NOTE — Telephone Encounter (Signed)
  Follow up Call-  Call back number 10/01/2020  Post procedure Call Back phone  # [phone]  Permission to leave phone message Yes  Some recent data might be hidden     No answer at 2nd attempt follow up phone call.  Left message on voicemail.

## 2020-10-09 ENCOUNTER — Encounter: Payer: Self-pay | Admitting: Internal Medicine

## 2020-12-18 ENCOUNTER — Other Ambulatory Visit: Payer: Self-pay | Admitting: Internal Medicine

## 2020-12-18 DIAGNOSIS — R140 Abdominal distension (gaseous): Secondary | ICD-10-CM

## 2020-12-18 DIAGNOSIS — K5904 Chronic idiopathic constipation: Secondary | ICD-10-CM

## 2020-12-18 DIAGNOSIS — K219 Gastro-esophageal reflux disease without esophagitis: Secondary | ICD-10-CM

## 2021-01-28 ENCOUNTER — Encounter: Payer: Self-pay | Admitting: Family Medicine

## 2021-02-06 ENCOUNTER — Telehealth: Payer: Self-pay | Admitting: Endocrinology

## 2021-02-06 NOTE — Telephone Encounter (Signed)
Patient called wanting to schedule an appointment with Dr Loanne Drilling, but he has not been seen in almost exactly 3 years. I sent Dr Loanne Drilling a secured chat asking if patient would need a referral because patient was seen previously for primary care here.  If patient has an Endocrine DX, he will need a referral sent from PCP. Other than that, based on Dr Cordelia Pen response, he will need to see his PCP for anything else. FYI in case patient calls back. I did try to call him at his work (as he requested) and was unable to get through to him.

## 2021-03-22 ENCOUNTER — Ambulatory Visit: Payer: Managed Care, Other (non HMO) | Admitting: Endocrinology

## 2021-03-29 ENCOUNTER — Other Ambulatory Visit: Payer: Self-pay

## 2021-03-29 ENCOUNTER — Ambulatory Visit: Payer: Managed Care, Other (non HMO) | Admitting: Endocrinology

## 2021-03-29 DIAGNOSIS — R6889 Other general symptoms and signs: Secondary | ICD-10-CM | POA: Insufficient documentation

## 2021-03-29 MED ORDER — LOSARTAN POTASSIUM-HCTZ 50-12.5 MG PO TABS: 0.5000 | ORAL_TABLET | Freq: Every day | ORAL | 0 refills | Status: DC

## 2021-03-29 NOTE — Progress Notes (Signed)
Subjective:    Patient ID: [name], male    DOB: Feb 12, 1961, 60 y.o.   MRN: [id number]  HPI Pt states few mos of sensation of something stuck inside the throat.   Pt requests rx for HTN and eval of other sxs.   Past Medical History:  Diagnosis Date  . Anxiety   . ARM PAIN, LEFT 11/20/2009  . Arthritis   . BIPOLAR DISORDER UNSPECIFIED 12/13/2007   patient denies  . Blood transfusion without reported diagnosis   . CHEST PAIN 03/23/2009   currently having   . DEPRESSION 12/13/2007  . FATIGUE 03/23/2009  . GERD (gastroesophageal reflux disease)   . GLAUCOMA 09/03/2009  . Headache   . HEMORRHOIDS 09/03/2009  . HOARSENESS 09/03/2009  . HYPERCHOLESTEROLEMIA 03/11/2010  . MYALGIA 08/13/2009  . NECK PAIN 01/18/2009  . Palpitations 03/23/2009  . Polydipsia 01/24/2008  . Shortness of breath 03/23/2009  . THYROID NODULE 08/28/2008   benign on Bx  . THYROIDITIS 12/13/2007  . Tubular adenoma of colon     Past Surgical History:  Procedure Laterality Date  . BACK SURGERY     x 3, lumbar  . EYE SURGERY     2008  . FINGER SURGERY    . TRANSANAL HEMORRHOIDAL DEARTERIALIZATION N/A 03/07/2016   Procedure: TRANSANAL HEMORRHOIDAL DEARTERIALIZATION HEMORRHOIDAL LIGATION/PEXY EXAM UNDER ANESTHESIA WITH  POSSIBLE HEMORRHOIDECTOMY ;  Surgeon: Michael Boston, MD;  Location: [facility];  Service: General;  Laterality: N/A;    Social History   Socioeconomic History  . Marital status: Divorced    Spouse name: Not on file  . Number of children: 1  . Years of education: Not on file  . Highest education level: Not on file  Occupational History  . Occupation: Museum/gallery conservator at [employer]  . Smoking status: Never Smoker  . Smokeless tobacco: Never Used  Vaping Use  . Vaping Use: Never used  Substance and Sexual Activity  . Alcohol use: No    Alcohol/week: 0.0 standard drinks  . Drug use: No  . Sexual activity: Not on file  Other Topics Concern  . Not on file  Social History Narrative   Mr  Bodmer lives with his wife in [place], 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       He is originally form [place]   Social Determinants of Radio broadcast assistant Strain: Not on [employer] Insecurity: Not on file  Transportation Needs: Not on file  Physical Activity: Not on file  Stress: Not on file  Social Connections: Not on file  Intimate Partner Violence: Not on file    Current Outpatient Medications on File Prior to Visit  Medication Sig Dispense Refill  . atorvastatin (LIPITOR) 20 MG tablet Take 1 tablet (20 mg total) by mouth daily. 90 tablet 2  . clindamycin-benzoyl peroxide (BENZACLIN) gel Apply topically 2 (two) times daily. 25 g 0  . clotrimazole-betamethasone (LOTRISONE) cream APPLY ONE APPLICATION TOPICALLY once daily 30 g 0  . diclofenac Sodium (VOLTAREN) 1 % GEL Apply 2 g topically 4 (four) times daily. 100 g 5  . escitalopram (LEXAPRO) 10 MG tablet Take 10 mg by mouth daily.    . fluticasone (FLONASE) 50 MCG/ACT nasal spray Place 1 spray into both nostrils 2 (two) times daily. 16 g 6  . HYDROcodone-acetaminophen (NORCO) 5-325 MG tablet Take 1 tablet by mouth every 6 (six) hours as needed for moderate pain. 30 tablet 0  . methocarbamol (ROBAXIN) 500  MG tablet TAKE ONE TABLET BY MOUTH EVERY MORNING AND AFTERNOON. TAKE TWO TABLETS BY MOUTH EVERY NIGHT AT BEDTIME AS NEEDED FOR MUSCLE RELAXATION 40 tablet 0  . mirtazapine (REMERON) 45 MG tablet     . Multiple Vitamin (MULTIVITAMIN) tablet Take 1 tablet by mouth daily.    Marland Kitchen omeprazole (PRILOSEC) 40 MG capsule Take 1 capsule (40 mg total) by mouth daily. 30 capsule 10  . sildenafil (VIAGRA) 100 MG tablet Take 0.5-1 tablets (50-100 mg total) by mouth daily as needed for erectile dysfunction. 30 tablet 3  . Travoprost (TRAVATAN OP) Apply to eye at bedtime.    . Travoprost, BAK Free, (TRAVATAN) 0.004 % SOLN ophthalmic solution     . TRULANCE 3 MG TABS TAKE ONE TABLET BY MOUTH DAILY 90 tablet 0   No  current facility-administered medications on file prior to visit.    No Known Allergies  Family History  Problem Relation Age of Onset  . Cancer Father 25       colon  . Colon cancer Father   . Dementia Mother   . Hypertension Other     BP (!) 170/86 (BP Location: Right Arm, Patient Position: Sitting, Cuff Size: Normal)   Pulse 78   Ht 5\' 5"  (1.651 m)   Wt 171 lb 6.4 oz (77.7 kg)   SpO2 97%   BMI 28.52 kg/m   Review of Systems No weight change.      Objective:   Physical Exam VITAL SIGNS:  See vs page GENERAL: no distress NECK: There is no palpable thyroid enlargement.  No thyroid nodule is palpable.  No palpable lymphadenopathy at the anterior neck.         Assessment & Plan:  HTN: uncontrolled Throat sensation, new, uncertain etiology and prognosis.   Patient Instructions  Your blood pressure is high today.  I have sent a prescription to your pharmacy, for this. Please see your new primary care provider as scheduled, to have it rechecked.   Please see an ear-nose-throat specialist.  you will receive a phone call, about a day and time for an appointment.

## 2021-03-29 NOTE — Patient Instructions (Addendum)
Your blood pressure is high today.  I have sent a prescription to your pharmacy, for this. Please see your new primary care provider as scheduled, to have it rechecked.   Please see an ear-nose-throat specialist.  you will receive a phone call, about a day and time for an appointment.

## 2021-04-02 ENCOUNTER — Other Ambulatory Visit: Payer: Self-pay | Admitting: Internal Medicine

## 2021-04-02 DIAGNOSIS — K219 Gastro-esophageal reflux disease without esophagitis: Secondary | ICD-10-CM

## 2021-04-02 DIAGNOSIS — R140 Abdominal distension (gaseous): Secondary | ICD-10-CM

## 2021-04-02 DIAGNOSIS — K5904 Chronic idiopathic constipation: Secondary | ICD-10-CM

## 2021-05-13 ENCOUNTER — Ambulatory Visit (INDEPENDENT_AMBULATORY_CARE_PROVIDER_SITE_OTHER): Payer: Managed Care, Other (non HMO) | Admitting: Otolaryngology

## 2021-06-10 ENCOUNTER — Ambulatory Visit: Payer: Managed Care, Other (non HMO) | Admitting: Emergency Medicine

## 2021-06-10 ENCOUNTER — Other Ambulatory Visit: Payer: Self-pay

## 2021-06-10 ENCOUNTER — Encounter: Payer: Self-pay | Admitting: Emergency Medicine

## 2021-06-10 VITALS — BP 128/80 | HR 83 | Temp 97.7°F | Ht 65.0 in | Wt 170.0 lb

## 2021-06-10 DIAGNOSIS — M5442 Lumbago with sciatica, left side: Secondary | ICD-10-CM | POA: Diagnosis not present

## 2021-06-10 DIAGNOSIS — G8929 Other chronic pain: Secondary | ICD-10-CM

## 2021-06-10 DIAGNOSIS — Z8739 Personal history of other diseases of the musculoskeletal system and connective tissue: Secondary | ICD-10-CM | POA: Diagnosis not present

## 2021-06-10 DIAGNOSIS — Z8719 Personal history of other diseases of the digestive system: Secondary | ICD-10-CM

## 2021-06-10 DIAGNOSIS — E042 Nontoxic multinodular goiter: Secondary | ICD-10-CM | POA: Diagnosis not present

## 2021-06-10 DIAGNOSIS — Z7689 Persons encountering health services in other specified circumstances: Secondary | ICD-10-CM

## 2021-06-10 MED ORDER — FLUTICASONE PROPIONATE 50 MCG/ACT NA SUSP: 1.0000 | Freq: Two times a day (BID) | NASAL | 6 refills | Status: DC

## 2021-06-10 NOTE — Patient Instructions (Signed)
Health Maintenance, Male Adopting a healthy lifestyle and getting preventive care are important in promoting health and wellness. Ask your health care provider about: The right schedule for you to have regular tests and exams. Things you can do on your own to prevent diseases and keep yourself healthy. What should I know about diet, weight, and exercise? Eat a healthy diet  Eat a diet that includes plenty of vegetables, fruits, low-fat dairy products, and lean protein. Do not eat a lot of foods that are high in solid fats, added sugars, or sodium.  Maintain a healthy weight Body mass index (BMI) is a measurement that can be used to identify possible weight problems. It estimates body fat based on height and weight. Your health care provider can help determine your BMI and help you achieve or maintain ahealthy weight. Get regular exercise Get regular exercise. This is one of the most important things you can do for your health. Most adults should: Exercise for at least 150 minutes each week. The exercise should increase your heart rate and make you sweat (moderate-intensity exercise). Do strengthening exercises at least twice a week. This is in addition to the moderate-intensity exercise. Spend less time sitting. Even light physical activity can be beneficial. Watch cholesterol and blood lipids Have your blood tested for lipids and cholesterol at 58 years of age, then havethis test every 5 years. You may need to have your cholesterol levels checked more often if: Your lipid or cholesterol levels are high. You are older than 58 years of age. You are at high risk for heart disease. What should I know about cancer screening? Many types of cancers can be detected early and may often be prevented. Depending on your health history and family history, you may need to have cancer screening at various ages. This may include screening for: Colorectal cancer. Prostate cancer. Skin cancer. Lung  cancer. What should I know about heart disease, diabetes, and high blood pressure? Blood pressure and heart disease High blood pressure causes heart disease and increases the risk of stroke. This is more likely to develop in people who have high blood pressure readings, are of African descent, or are overweight. Talk with your health care provider about your target blood pressure readings. Have your blood pressure checked: Every 3-5 years if you are 18-39 years of age. Every year if you are 40 years old or older. If you are between the ages of 65 and 75 and are a current or former smoker, ask your health care provider if you should have a one-time screening for abdominal aortic aneurysm (AAA). Diabetes Have regular diabetes screenings. This checks your fasting blood sugar level. Have the screening done: Once every three years after age 45 if you are at a normal weight and have a low risk for diabetes. More often and at a younger age if you are overweight or have a high risk for diabetes. What should I know about preventing infection? Hepatitis B If you have a higher risk for hepatitis B, you should be screened for this virus. Talk with your health care provider to find out if you are at risk forhepatitis B infection. Hepatitis C Blood testing is recommended for: Everyone born from 1945 through 1965. Anyone with known risk factors for hepatitis C. Sexually transmitted infections (STIs) You should be screened each year for STIs, including gonorrhea and chlamydia, if: You are sexually active and are younger than 58 years of age. You are older than 58 years of age   and your health care provider tells you that you are at risk for this type of infection. Your sexual activity has changed since you were last screened, and you are at increased risk for chlamydia or gonorrhea. Ask your health care provider if you are at risk. Ask your health care provider about whether you are at high risk for HIV.  Your health care provider may recommend a prescription medicine to help prevent HIV infection. If you choose to take medicine to prevent HIV, you should first get tested for HIV. You should then be tested every 3 months for as long as you are taking the medicine. Follow these instructions at home: Lifestyle Do not use any products that contain nicotine or tobacco, such as cigarettes, e-cigarettes, and chewing tobacco. If you need help quitting, ask your health care provider. Do not use street drugs. Do not share needles. Ask your health care provider for help if you need support or information about quitting drugs. Alcohol use Do not drink alcohol if your health care provider tells you not to drink. If you drink alcohol: Limit how much you have to 0-2 drinks a day. Be aware of how much alcohol is in your drink. In the [place] one drink equals one 12 oz bottle of beer (355 mL), one 5 oz glass of wine (148 mL), or one 1 oz glass of hard liquor (44 mL). General instructions Schedule regular health, dental, and eye exams. Stay current with your vaccines. Tell your health care provider if: You often feel depressed. You have ever been abused or do not feel safe at home. Summary Adopting a healthy lifestyle and getting preventive care are important in promoting health and wellness. Follow your health care provider's instructions about healthy diet, exercising, and getting tested or screened for diseases. Follow your health care provider's instructions on monitoring your cholesterol and blood pressure. This information is not intended to replace advice given to you by your health care provider. Make sure you discuss any questions you have with your healthcare provider. Document Revised: 10/06/2018 Document Reviewed: 10/06/2018 Elsevier Patient Education  2022 [employer]  

## 2021-06-10 NOTE — Progress Notes (Signed)
[name] 60 y.o.   Chief Complaint  Patient presents with   New Patient (Initial Visit)    Back pain, and pt states feet feel hot.    HISTORY OF PRESENT ILLNESS: This is a 61 y.o. male Patient is a 60 y.o. male with past medical history significant for HLP, bipolar disorder, thyroid nodule - benign, thyroiditis, chronic constipation, GERD, ED, colonic polyps, sciatica who presents today to establish care with me. Has history of chronic low back pain.  Status post 3 back surgeries. Sometimes "feet feel hot". No other complaints or medical concerns today.  HPI   Prior to Admission medications   Medication Sig Start Date End Date Taking? Authorizing Provider  atorvastatin (LIPITOR) 20 MG tablet Take 1 tablet (20 mg total) by mouth daily. 08/03/20  Yes [name], Stephen Argue, MD  clindamycin-benzoyl peroxide [facility]) gel Apply topically 2 (two) times daily. 05/28/20  Yes Stephen Coss, NP  clotrimazole-betamethasone (LOTRISONE) cream APPLY ONE APPLICATION TOPICALLY once daily 05/28/20  Yes Stephen Coss, NP  diclofenac Sodium (VOLTAREN) 1 % GEL Apply 2 g topically 4 (four) times daily. 08/03/20  Yes [name], Stephen M, MD  escitalopram (LEXAPRO) 10 MG tablet Take 10 mg by mouth daily.   Yes [provider]  losartan-hydrochlorothiazide (HYZAAR) 50-12.5 MG tablet Take 0.5 tablets by mouth daily. 03/29/21  Yes Stephen Shin, MD  mirtazapine (REMERON) 45 MG tablet  06/02/17  Yes [provider]  Multiple Vitamin (MULTIVITAMIN) tablet Take 1 tablet by mouth daily.   Yes [provider]  omeprazole (PRILOSEC) 40 MG capsule Take 1 capsule (40 mg total) by mouth daily. 08/03/20  Yes [name], Stephen Argue, MD  sildenafil (VIAGRA) 100 MG tablet Take 0.5-1 tablets (50-100 mg total) by mouth daily as needed for erectile dysfunction. 04/16/20  Yes [name], Stephen Bloomer, MD  Travoprost (TRAVATAN OP) Apply to eye at bedtime.   Yes [provider]  Travoprost, BAK  Free, (TRAVATAN) 0.004 % SOLN ophthalmic solution  09/28/20  Yes [provider]  TRULANCE 3 MG TABS TAKE ONE TABLET BY MOUTH DAILY 04/02/21  Yes [name], Lajuan Lines, MD  fluticasone (FLONASE) 50 MCG/ACT nasal spray Place 1 spray into both nostrils 2 (two) times daily. Patient not taking: Reported on 06/10/2021 01/05/20   [name], Stephen Argue, MD  HYDROcodone-acetaminophen [facility]) 5-325 MG tablet Take 1 tablet by mouth every 6 (six) hours as needed for moderate pain. Patient not taking: Reported on 06/10/2021 08/03/20   [name], Stephen Argue, MD  methocarbamol (ROBAXIN) 500 MG tablet TAKE ONE TABLET BY MOUTH EVERY MORNING AND AFTERNOON. TAKE TWO TABLETS BY MOUTH EVERY NIGHT AT BEDTIME AS NEEDED FOR MUSCLE RELAXATION Patient not taking: Reported on 06/10/2021 08/03/20   [name], Stephen Argue, MD    No Known Allergies  Patient Active Problem List   Diagnosis Date Noted   Prolapsed internal hemorrhoids, grade 3, s/p [facility] ligation/pexy 03/07/2016 03/07/2016   Depression 08/16/2015   Erectile dysfunction 04/25/2015   Disorder of liver 10/13/2011   HYPERCHOLESTEROLEMIA 03/11/2010   GLAUCOMA 09/03/2009   THYROID NODULE 08/28/2008   Thyroiditis 12/13/2007   BIPOLAR DISORDER UNSPECIFIED 12/13/2007    Past Medical History:  Diagnosis Date   Anxiety    ARM PAIN, LEFT 11/20/2009   Arthritis    BIPOLAR DISORDER UNSPECIFIED 12/13/2007   patient denies   Blood transfusion without reported diagnosis    CHEST PAIN 03/23/2009   currently having    DEPRESSION 12/13/2007   FATIGUE 03/23/2009  GERD (gastroesophageal reflux disease)    GLAUCOMA 09/03/2009   Headache    HEMORRHOIDS 09/03/2009   HOARSENESS 09/03/2009   HYPERCHOLESTEROLEMIA 03/11/2010   MYALGIA 08/13/2009   NECK PAIN 01/18/2009   Palpitations 03/23/2009   Polydipsia 01/24/2008   Shortness of breath 03/23/2009   THYROID NODULE 08/28/2008   benign on Bx   THYROIDITIS 12/13/2007   Tubular adenoma of colon     Past Surgical History:  Procedure  Laterality Date   BACK SURGERY     x 3, lumbar   EYE SURGERY     2008   FINGER SURGERY     TRANSANAL HEMORRHOIDAL DEARTERIALIZATION N/A 03/07/2016   Procedure: TRANSANAL HEMORRHOIDAL DEARTERIALIZATION HEMORRHOIDAL LIGATION/PEXY EXAM UNDER ANESTHESIA WITH  POSSIBLE HEMORRHOIDECTOMY ;  Surgeon: Stephen Boston, MD;  Location: [facility];  Service: General;  Laterality: N/A;    Social History   Socioeconomic History   Marital status: Divorced    Spouse name: Not on file   Number of children: 1   Years of education: Not on file   Highest education level: Not on file  Occupational History   Occupation: Museum/gallery conservator at [employer] Use   Smoking status: Never   Smokeless tobacco: Never  Vaping Use   Vaping Use: Never used  Substance and Sexual Activity   Alcohol use: No    Alcohol/week: 0.0 standard drinks   Drug use: No   Sexual activity: Not on file  Other Topics Concern   Not on file  Social History Narrative   Mr [name] lives with his wife in [place], 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       He is originally form [place]   Social Determinants of Sales executive: Not on [employer] Insecurity: Not on file  Transportation Needs: Not on file  Physical Activity: Not on file  Stress: Not on file  Social Connections: Not on file  Intimate Partner Violence: Not on file    Family History  Problem Relation Age of Onset   Cancer Father 48       colon   Colon cancer Father    Dementia Mother    Hypertension Other      Review of Systems  Constitutional: Negative.  Negative for chills and fever.  HENT: Negative.  Negative for congestion and sore throat.   Respiratory: Negative.  Negative for cough and shortness of breath.   Cardiovascular:  Negative for chest pain and palpitations.  Gastrointestinal:  Negative for abdominal pain, diarrhea, nausea and vomiting.  Genitourinary: Negative.  Negative for dysuria and hematuria.   Musculoskeletal:  Positive for back pain.  Skin: Negative.  Negative for rash.  Neurological:  Negative for dizziness and headaches.  All other systems reviewed and are negative.   Physical Exam Vitals reviewed.  Constitutional:      Appearance: Normal appearance.  HENT:     Head: Normocephalic.  Eyes:     Extraocular Movements: Extraocular movements intact.     Conjunctiva/sclera: Conjunctivae normal.     Pupils: Pupils are equal, round, and reactive to light.  Cardiovascular:     Rate and Rhythm: Normal rate and regular rhythm.     Pulses: Normal pulses.     Heart sounds: Normal heart sounds.  Pulmonary:     Effort: Pulmonary effort is normal.     Breath sounds: Normal breath sounds.  Musculoskeletal:        General: Normal range of motion.  Cervical back: Normal range of motion and neck supple.  Skin:    General: Skin is warm and dry.     Capillary Refill: Capillary refill takes less than 2 seconds.  Neurological:     General: No focal deficit present.     Mental Status: He is alert and oriented to person, place, and time.  Psychiatric:        Mood and Affect: Mood normal.        Behavior: Behavior normal.     ASSESSMENT & PLAN: Clinically stable.  No medical concerns identified during this visit.  Past medical history reviewed with patient.  Medications reviewed with patient.  Patient states he has an upcoming annual exam in October or September.  Brndon was seen today for new patient (initial visit).  Diagnoses and all orders for this visit:  Chronic bilateral low back pain with left-sided sciatica  Encounter to establish care  History of chronic back pain  Multiple thyroid nodules  History of gastroesophageal reflux (GERD)  Other orders -     fluticasone (FLONASE) 50 MCG/ACT nasal spray; Place 1 spray into both nostrils 2 (two) times daily. Patient Instructions  Health Maintenance, Male Adopting a healthy lifestyle and getting preventive care are  important in promoting health and wellness. Ask your health care provider about: The right schedule for you to have regular tests and exams. Things you can do on your own to prevent diseases and keep yourself healthy. What should I know about diet, weight, and exercise? Eat a healthy diet  Eat a diet that includes plenty of vegetables, fruits, low-fat dairy products, and lean protein. Do not eat a lot of foods that are high in solid fats, added sugars, or sodium.  Maintain a healthy weight Body mass index (BMI) is a measurement that can be used to identify possible weight problems. It estimates body fat based on height and weight. Your health care provider can help determine your BMI and help you achieve or maintain ahealthy weight. Get regular exercise Get regular exercise. This is one of the most important things you can do for your health. Most adults should: Exercise for at least 150 minutes each week. The exercise should increase your heart rate and make you sweat (moderate-intensity exercise). Do strengthening exercises at least twice a week. This is in addition to the moderate-intensity exercise. Spend less time sitting. Even light physical activity can be beneficial. Watch cholesterol and blood lipids Have your blood tested for lipids and cholesterol at 58 years of age, then havethis test every 5 years. You may need to have your cholesterol levels checked more often if: Your lipid or cholesterol levels are high. You are older than 58 years of age. You are at high risk for heart disease. What should I know about cancer screening? Many types of cancers can be detected early and may often be prevented. Depending on your health history and family history, you may need to have cancer screening at various ages. This may include screening for: Colorectal cancer. Prostate cancer. Skin cancer. Lung cancer. What should I know about heart disease, diabetes, and high blood pressure? Blood  pressure and heart disease High blood pressure causes heart disease and increases the risk of stroke. This is more likely to develop in people who have high blood pressure readings, are of African descent, or are overweight. Talk with your health care provider about your target blood pressure readings. Have your blood pressure checked: Every 3-5 years if you are 18-39  years of age. Every year if you are 76 years old or older. If you are between the ages of 83 and 56 and are a current or former smoker, ask your health care provider if you should have a one-time screening for abdominal aortic aneurysm (AAA). Diabetes Have regular diabetes screenings. This checks your fasting blood sugar level. Have the screening done: Once every three years after age 67 if you are at a normal weight and have a low risk for diabetes. More often and at a younger age if you are overweight or have a high risk for diabetes. What should I know about preventing infection? Hepatitis B If you have a higher risk for hepatitis B, you should be screened for this virus. Talk with your health care provider to find out if you are at risk forhepatitis B infection. Hepatitis C Blood testing is recommended for: Everyone born from [birth year] through 1965. Anyone with known risk factors for hepatitis C. Sexually transmitted infections (STIs) You should be screened each year for STIs, including gonorrhea and chlamydia, if: You are sexually active and are younger than 58 years of age. You are older than 58 years of age and your health care provider tells you that you are at risk for this type of infection. Your sexual activity has changed since you were last screened, and you are at increased risk for chlamydia or gonorrhea. Ask your health care provider if you are at risk. Ask your health care provider about whether you are at high risk for HIV. Your health care provider may recommend a prescription medicine to help prevent HIV infection.  If you choose to take medicine to prevent HIV, you should first get tested for HIV. You should then be tested every 3 months for as long as you are taking the medicine. Follow these instructions at home: Lifestyle Do not use any products that contain nicotine or tobacco, such as cigarettes, e-cigarettes, and chewing tobacco. If you need help quitting, ask your health care provider. Do not use street drugs. Do not share needles. Ask your health care provider for help if you need support or information about quitting drugs. Alcohol use Do not drink alcohol if your health care provider tells you not to drink. If you drink alcohol: Limit how much you have to 0-2 drinks a day. Be aware of how much alcohol is in your drink. In the [place] one drink equals one 12 oz bottle of beer (355 mL), one 5 oz glass of wine (148 mL), or one 1 oz glass of hard liquor (44 mL). General instructions Schedule regular health, dental, and eye exams. Stay current with your vaccines. Tell your health care provider if: You often feel depressed. You have ever been abused or do not feel safe at home. Summary Adopting a healthy lifestyle and getting preventive care are important in promoting health and wellness. Follow your health care provider's instructions about healthy diet, exercising, and getting tested or screened for diseases. Follow your health care provider's instructions on monitoring your cholesterol and blood pressure. This information is not intended to replace advice given to you by your health care provider. Make sure you discuss any questions you have with your healthcare provider. Document Revised: 10/06/2018 Document Reviewed: 10/06/2018 Elsevier Patient Education  2022 [place], MD [facility]

## 2021-06-19 ENCOUNTER — Ambulatory Visit: Payer: Managed Care, Other (non HMO) | Admitting: Endocrinology

## 2021-06-21 ENCOUNTER — Telehealth: Payer: Self-pay | Admitting: Emergency Medicine

## 2021-06-21 NOTE — Telephone Encounter (Signed)
1.Medication Requested:atorvastatin (LIPITOR) 20 MG tablet  2. Pharmacy (Name, [place]): [id number] - [place], [place]  Phone:  [phone] Fax:  [phone]   3. On Med List: yes  4. Last Visit with PCP: 08.15.22  5. Next visit date with PCP: 10.17.22   Agent: Please be advised that RX refills may take up to 3 business days. We ask that you follow-up with your pharmacy.

## 2021-06-24 ENCOUNTER — Other Ambulatory Visit: Payer: Self-pay | Admitting: Emergency Medicine

## 2021-06-24 MED ORDER — ATORVASTATIN CALCIUM 20 MG PO TABS: 20.0000 mg | ORAL_TABLET | Freq: Every day | ORAL | 2 refills | Status: DC

## 2021-06-24 NOTE — Telephone Encounter (Signed)
Please advise as the pt has called stating he is out of his medication and is in need of a rx refill.  Medication: atorvastatin (LIPITOR) 20 MG tablet.

## 2021-06-24 NOTE — Telephone Encounter (Signed)
New prescription sent to pharmacy of record

## 2021-07-04 ENCOUNTER — Telehealth: Payer: Self-pay | Admitting: Orthopaedic Surgery

## 2021-07-04 NOTE — Telephone Encounter (Signed)
Pt states he is going out of town and he needs a letter stating he has metal in his back from surgery. He states the surgery was in 2005. He would like it emailed. I verified e-mail on file.

## 2021-07-04 NOTE — Telephone Encounter (Signed)
Patient had lumbar instrumented fusion 03/28/2005 with Dr. Lorin Mercy. Note entered that patient had this surgery and emailed to patient.

## 2021-07-15 ENCOUNTER — Ambulatory Visit (INDEPENDENT_AMBULATORY_CARE_PROVIDER_SITE_OTHER): Payer: Managed Care, Other (non HMO) | Admitting: Otolaryngology

## 2021-07-23 ENCOUNTER — Telehealth: Payer: Self-pay | Admitting: Emergency Medicine

## 2021-07-23 DIAGNOSIS — K219 Gastro-esophageal reflux disease without esophagitis: Secondary | ICD-10-CM

## 2021-07-23 NOTE — Telephone Encounter (Signed)
1.Medication Requested: atorvastatin (LIPITOR) 20 MG tablet  2. Pharmacy (Name, [place] [place] [id number] - [place], [place]  3. On Med List: yes   4. Last Visit with PCP: 8.15.22  5. Next visit date with PCP: 10.17.22   Agent: Please be advised that RX refills may take up to 3 business days. We ask that you follow-up with your pharmacy.

## 2021-07-24 ENCOUNTER — Telehealth: Payer: Self-pay | Admitting: Emergency Medicine

## 2021-07-24 NOTE — Telephone Encounter (Signed)
1.Medication Requested: omeprazole (PRILOSEC) 40 MG capsule  2. Pharmacy (Name, [place]): [facility] [id number] - [place], [place]  Phone:  [phone] Fax:  [phone]   3. On Med List: yes  4. Last Visit with PCP: 08.05.22  5. Next visit date with PCP: 10.17.22   Agent: Please be advised that RX refills may take up to 3 business days. We ask that you follow-up with your pharmacy.

## 2021-07-25 MED ORDER — OMEPRAZOLE 40 MG PO CPDR: 40.0000 mg | DELAYED_RELEASE_CAPSULE | Freq: Every day | ORAL | 10 refills | Status: DC

## 2021-07-25 NOTE — Telephone Encounter (Signed)
A request for atorstatin (Lipitor) was sent in, but there are still refills on medication. Medication was not refilled.

## 2021-07-25 NOTE — Telephone Encounter (Signed)
Refilled medication

## 2021-08-12 ENCOUNTER — Encounter: Payer: Managed Care, Other (non HMO) | Admitting: Emergency Medicine

## 2021-08-19 ENCOUNTER — Encounter: Payer: Self-pay | Admitting: Emergency Medicine

## 2021-08-19 ENCOUNTER — Other Ambulatory Visit: Payer: Self-pay | Admitting: Emergency Medicine

## 2021-08-19 ENCOUNTER — Other Ambulatory Visit: Payer: Self-pay

## 2021-08-19 ENCOUNTER — Ambulatory Visit (INDEPENDENT_AMBULATORY_CARE_PROVIDER_SITE_OTHER): Payer: Managed Care, Other (non HMO) | Admitting: Emergency Medicine

## 2021-08-19 VITALS — BP 130/80 | HR 77 | Temp 98.4°F | Ht 65.0 in | Wt 170.0 lb

## 2021-08-19 DIAGNOSIS — Z130 Encounter for screening for diseases of the blood and blood-forming organs and certain disorders involving the immune mechanism: Secondary | ICD-10-CM | POA: Diagnosis not present

## 2021-08-19 DIAGNOSIS — Z1329 Encounter for screening for other suspected endocrine disorder: Secondary | ICD-10-CM

## 2021-08-19 DIAGNOSIS — R21 Rash and other nonspecific skin eruption: Secondary | ICD-10-CM

## 2021-08-19 DIAGNOSIS — M5431 Sciatica, right side: Secondary | ICD-10-CM

## 2021-08-19 DIAGNOSIS — Z13220 Encounter for screening for lipoid disorders: Secondary | ICD-10-CM | POA: Diagnosis not present

## 2021-08-19 DIAGNOSIS — M5442 Lumbago with sciatica, left side: Secondary | ICD-10-CM

## 2021-08-19 DIAGNOSIS — Z0000 Encounter for general adult medical examination without abnormal findings: Secondary | ICD-10-CM

## 2021-08-19 DIAGNOSIS — Z125 Encounter for screening for malignant neoplasm of prostate: Secondary | ICD-10-CM

## 2021-08-19 DIAGNOSIS — M5441 Lumbago with sciatica, right side: Secondary | ICD-10-CM

## 2021-08-19 DIAGNOSIS — Z13228 Encounter for screening for other metabolic disorders: Secondary | ICD-10-CM

## 2021-08-19 DIAGNOSIS — G8929 Other chronic pain: Secondary | ICD-10-CM

## 2021-08-19 LAB — LIPID PANEL
Cholesterol: 146 mg/dL (ref 0–200)
HDL: 71.5 mg/dL (ref >39.00)
LDL Cholesterol: 47 mg/dL (ref 0–99)
NonHDL: 74.45
Total CHOL/HDL Ratio: 2
Triglycerides: 135 mg/dL (ref 0.0–149.0)
VLDL: 27 mg/dL (ref 0.0–40.0)

## 2021-08-19 LAB — COMPREHENSIVE METABOLIC PANEL
ALT: 33 U/L (ref 0–53)
AST: 25 U/L (ref 0–37)
Albumin: 4.5 g/dL (ref 3.5–5.2)
Alkaline Phosphatase: 119 U/L — ABNORMAL HIGH (ref 39–117)
BUN: 13 mg/dL (ref 6–23)
CO2: 30 mEq/L (ref 19–32)
Calcium: 9.3 mg/dL (ref 8.4–10.5)
Chloride: 106 mEq/L (ref 96–112)
Creatinine, Ser: 0.92 mg/dL (ref 0.40–1.50)
GFR: 91.94 mL/min (ref >60.00)
Glucose, Bld: 81 mg/dL (ref 70–99)
Potassium: 4.2 mEq/L (ref 3.5–5.1)
Sodium: 143 mEq/L (ref 135–145)
Total Bilirubin: 0.5 mg/dL (ref 0.2–1.2)
Total Protein: 7.1 g/dL (ref 6.0–8.3)

## 2021-08-19 LAB — CBC WITH DIFFERENTIAL/PLATELET
Basophils Absolute: 0 10*3/uL (ref 0.0–0.1)
Basophils Relative: 0.5 % (ref 0.0–3.0)
Eosinophils Absolute: 0.1 10*3/uL (ref 0.0–0.7)
Eosinophils Relative: 1.4 % (ref 0.0–5.0)
HCT: 41 % (ref 39.0–52.0)
Hemoglobin: 13.6 g/dL (ref 13.0–17.0)
Lymphocytes Relative: 53.4 % — ABNORMAL HIGH (ref 12.0–46.0)
Lymphs Abs: 3 10*3/uL (ref 0.7–4.0)
MCHC: 33.1 g/dL (ref 30.0–36.0)
MCV: 86 fl (ref 78.0–100.0)
Monocytes Absolute: 0.5 10*3/uL (ref 0.1–1.0)
Monocytes Relative: 8.3 % (ref 3.0–12.0)
Neutro Abs: 2.1 10*3/uL (ref 1.4–7.7)
Neutrophils Relative %: 36.4 % — ABNORMAL LOW (ref 43.0–77.0)
Platelets: 277 10*3/uL (ref 150.0–400.0)
RBC: 4.77 Mil/uL (ref 4.22–5.81)
RDW: 13.8 % (ref 11.5–15.5)
WBC: 5.6 10*3/uL (ref 4.0–10.5)

## 2021-08-19 LAB — HEMOGLOBIN A1C: Hgb A1c MFr Bld: 6.3 % (ref 4.6–6.5)

## 2021-08-19 LAB — PSA: PSA: 0.53 ng/mL (ref 0.10–4.00)

## 2021-08-19 MED ORDER — FLUTICASONE PROPIONATE 50 MCG/ACT NA SUSP: 1.0000 | Freq: Two times a day (BID) | NASAL | 6 refills | Status: DC

## 2021-08-19 MED ORDER — METHOCARBAMOL 500 MG PO TABS: ORAL_TABLET | ORAL | 0 refills | Status: DC

## 2021-08-19 MED ORDER — CLOTRIMAZOLE-BETAMETHASONE 1-0.05 % EX CREA: TOPICAL_CREAM | CUTANEOUS | 0 refills | Status: DC

## 2021-08-19 MED ORDER — SILDENAFIL CITRATE 100 MG PO TABS: 50.0000 mg | ORAL_TABLET | Freq: Every day | ORAL | 3 refills | Status: DC | PRN

## 2021-08-19 NOTE — Patient Instructions (Signed)
Health Maintenance, Male Adopting a healthy lifestyle and getting preventive care are important in promoting health and wellness. Ask your health care provider about: The right schedule for you to have regular tests and exams. Things you can do on your own to prevent diseases and keep yourself healthy. What should I know about diet, weight, and exercise? Eat a healthy diet  Eat a diet that includes plenty of vegetables, fruits, low-fat dairy products, and lean protein. Do not eat a lot of foods that are high in solid fats, added sugars, or sodium. Maintain a healthy weight Body mass index (BMI) is a measurement that can be used to identify possible weight problems. It estimates body fat based on height and weight. Your health care provider can help determine your BMI and help you achieve or maintain a healthy weight. Get regular exercise Get regular exercise. This is one of the most important things you can do for your health. Most adults should: Exercise for at least 150 minutes each week. The exercise should increase your heart rate and make you sweat (moderate-intensity exercise). Do strengthening exercises at least twice a week. This is in addition to the moderate-intensity exercise. Spend less time sitting. Even light physical activity can be beneficial. Watch cholesterol and blood lipids Have your blood tested for lipids and cholesterol at 58 years of age, then have this test every 5 years. You may need to have your cholesterol levels checked more often if: Your lipid or cholesterol levels are high. You are older than 58 years of age. You are at high risk for heart disease. What should I know about cancer screening? Many types of cancers can be detected early and may often be prevented. Depending on your health history and family history, you may need to have cancer screening at various ages. This may include screening for: Colorectal cancer. Prostate cancer. Skin cancer. Lung  cancer. What should I know about heart disease, diabetes, and high blood pressure? Blood pressure and heart disease High blood pressure causes heart disease and increases the risk of stroke. This is more likely to develop in people who have high blood pressure readings, are of African descent, or are overweight. Talk with your health care provider about your target blood pressure readings. Have your blood pressure checked: Every 3-5 years if you are 18-39 years of age. Every year if you are 40 years old or older. If you are between the ages of 65 and 75 and are a current or former smoker, ask your health care provider if you should have a one-time screening for abdominal aortic aneurysm (AAA). Diabetes Have regular diabetes screenings. This checks your fasting blood sugar level. Have the screening done: Once every three years after age 45 if you are at a normal weight and have a low risk for diabetes. More often and at a younger age if you are overweight or have a high risk for diabetes. What should I know about preventing infection? Hepatitis B If you have a higher risk for hepatitis B, you should be screened for this virus. Talk with your health care provider to find out if you are at risk for hepatitis B infection. Hepatitis C Blood testing is recommended for: Everyone born from 1945 through 1965. Anyone with known risk factors for hepatitis C. Sexually transmitted infections (STIs) You should be screened each year for STIs, including gonorrhea and chlamydia, if: You are sexually active and are younger than 58 years of age. You are older than 58 years   of age and your health care provider tells you that you are at risk for this type of infection. Your sexual activity has changed since you were last screened, and you are at increased risk for chlamydia or gonorrhea. Ask your health care provider if you are at risk. Ask your health care provider about whether you are at high risk for HIV.  Your health care provider may recommend a prescription medicine to help prevent HIV infection. If you choose to take medicine to prevent HIV, you should first get tested for HIV. You should then be tested every 3 months for as long as you are taking the medicine. Follow these instructions at home: Lifestyle Do not use any products that contain nicotine or tobacco, such as cigarettes, e-cigarettes, and chewing tobacco. If you need help quitting, ask your health care provider. Do not use street drugs. Do not share needles. Ask your health care provider for help if you need support or information about quitting drugs. Alcohol use Do not drink alcohol if your health care provider tells you not to drink. If you drink alcohol: Limit how much you have to 0-2 drinks a day. Be aware of how much alcohol is in your drink. In the [place] one drink equals one 12 oz bottle of beer (355 mL), one 5 oz glass of wine (148 mL), or one 1 oz glass of hard liquor (44 mL). General instructions Schedule regular health, dental, and eye exams. Stay current with your vaccines. Tell your health care provider if: You often feel depressed. You have ever been abused or do not feel safe at home. Summary Adopting a healthy lifestyle and getting preventive care are important in promoting health and wellness. Follow your health care provider's instructions about healthy diet, exercising, and getting tested or screened for diseases. Follow your health care provider's instructions on monitoring your cholesterol and blood pressure. This information is not intended to replace advice given to you by your health care provider. Make sure you discuss any questions you have with your health care provider. Document Revised: 12/21/2020 Document Reviewed: 10/06/2018 Elsevier Patient Education  2022 [employer] Inc[employer]  

## 2021-08-19 NOTE — Progress Notes (Signed)
[name] 60 y.o.   Chief Complaint  Patient presents with   Annual Exam    HISTORY OF PRESENT ILLNESS: This is a 60 y.o. male here for annual exam. Complaining of left-sided lumbar chronic pain with sciatica left more than right. Has history of lumbar surgery about 10 years ago by Dr. Lorin Mercy. No other complaints or medical concerns today.  HPI   Prior to Admission medications   Medication Sig Start Date End Date Taking? Authorizing Provider  atorvastatin (LIPITOR) 20 MG tablet Take 1 tablet (20 mg total) by mouth daily. 06/24/21  Yes Marios Gaiser, [name], MD  clindamycin-benzoyl peroxide [facility]) gel Apply topically 2 (two) times daily. 05/28/20  Yes Maximiano Coss, NP  clotrimazole-betamethasone (LOTRISONE) cream APPLY ONE APPLICATION TOPICALLY once daily 05/28/20  Yes Maximiano Coss, NP  diclofenac Sodium (VOLTAREN) 1 % GEL Apply 2 g topically 4 (four) times daily. 08/03/20  Yes Jacelyn Pi, Irma M, MD  escitalopram (LEXAPRO) 10 MG tablet Take 10 mg by mouth daily.   Yes [provider]  fluticasone (FLONASE) 50 MCG/ACT nasal spray Place 1 spray into both nostrils 2 (two) times daily. 06/10/21  Yes Janiaya Ryser, [name], MD  HYDROcodone-acetaminophen (NORCO) 5-325 MG tablet Take 1 tablet by mouth every 6 (six) hours as needed for moderate pain. 08/03/20  Yes Jacelyn Pi, Lilia Argue, MD  losartan-hydrochlorothiazide [facility]) 50-12.5 MG tablet Take 0.5 tablets by mouth daily. 03/29/21  Yes Renato Shin, MD  methocarbamol (ROBAXIN) 500 MG tablet TAKE ONE TABLET BY MOUTH EVERY MORNING AND AFTERNOON. TAKE TWO TABLETS BY MOUTH EVERY NIGHT AT BEDTIME AS NEEDED FOR MUSCLE RELAXATION 08/03/20  Yes Jacelyn Pi, Irma M, MD  mirtazapine (REMERON) 45 MG tablet  06/02/17  Yes [provider]  Multiple Vitamin (MULTIVITAMIN) tablet Take 1 tablet by mouth daily.   Yes [provider]  omeprazole (PRILOSEC) 40 MG capsule Take 1 capsule (40 mg total) by mouth daily. 07/25/21   Yes Rashied Corallo, [name], MD  sildenafil (VIAGRA) 100 MG tablet Take 0.5-1 tablets (50-100 mg total) by mouth daily as needed for erectile dysfunction. 04/16/20  Yes Seline Enzor, [name], MD  Travoprost (TRAVATAN OP) Apply to eye at bedtime.   Yes [provider]  Travoprost, BAK Free, (TRAVATAN) 0.004 % SOLN ophthalmic solution  09/28/20  Yes [provider]  TRULANCE 3 MG TABS TAKE ONE TABLET BY MOUTH DAILY 04/02/21  Yes Pyrtle, Lajuan Lines, MD    No Known Allergies  Patient Active Problem List   Diagnosis Date Noted   Prolapsed internal hemorrhoids, grade 3, s/p [facility] ligation/pexy 03/07/2016 03/07/2016   Depression 08/16/2015   Erectile dysfunction 04/25/2015   Disorder of liver 10/13/2011   HYPERCHOLESTEROLEMIA 03/11/2010   GLAUCOMA 09/03/2009   THYROID NODULE 08/28/2008   Thyroiditis 12/13/2007   BIPOLAR DISORDER UNSPECIFIED 12/13/2007    Past Medical History:  Diagnosis Date   Anxiety    ARM PAIN, LEFT 11/20/2009   Arthritis    BIPOLAR DISORDER UNSPECIFIED 12/13/2007   patient denies   Blood transfusion without reported diagnosis    CHEST PAIN 03/23/2009   currently having    DEPRESSION 12/13/2007   FATIGUE 03/23/2009   GERD (gastroesophageal reflux disease)    GLAUCOMA 09/03/2009   Headache    HEMORRHOIDS 09/03/2009   HOARSENESS 09/03/2009   HYPERCHOLESTEROLEMIA 03/11/2010   MYALGIA 08/13/2009   NECK PAIN 01/18/2009   Palpitations 03/23/2009   Polydipsia 01/24/2008   Shortness of breath 03/23/2009   THYROID NODULE 08/28/2008   benign on  Bx   THYROIDITIS 12/13/2007   Tubular adenoma of colon     Past Surgical History:  Procedure Laterality Date   BACK SURGERY     x 3, lumbar   EYE SURGERY     2008   FINGER SURGERY     TRANSANAL HEMORRHOIDAL DEARTERIALIZATION N/A 03/07/2016   Procedure: TRANSANAL HEMORRHOIDAL DEARTERIALIZATION HEMORRHOIDAL LIGATION/PEXY EXAM UNDER ANESTHESIA WITH  POSSIBLE HEMORRHOIDECTOMY ;  Surgeon: Michael Boston, MD;  Location: [facility];   Service: General;  Laterality: N/A;    Social History   Socioeconomic History   Marital status: Divorced    Spouse name: Not on file   Number of children: 1   Years of education: Not on file   Highest education level: Not on file  Occupational History   Occupation: Museum/gallery conservator at [employer]   Smoking status: Never   Smokeless tobacco: Never  Vaping Use   Vaping Use: Never used  Substance and Sexual Activity   Alcohol use: No    Alcohol/week: 0.0 standard drinks   Drug use: No   Sexual activity: Not on file  Other Topics Concern   Not on file  Social History Narrative   Mr Mercier lives with his wife in [place], 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       He is originally form [place]   Social Determinants of Sales executive: Not on [employer] Insecurity: Not on file  Transportation Needs: Not on file  Physical Activity: Not on file  Stress: Not on file  Social Connections: Not on file  Intimate Partner Violence: Not on file    Family History  Problem Relation Age of Onset   Cancer Father 32       colon   Colon cancer Father    Dementia Mother    Hypertension Other      Review of Systems  Constitutional: Negative.  Negative for chills and fever.  HENT: Negative.  Negative for congestion and sore throat.   Respiratory: Negative.  Negative for cough and shortness of breath.   Cardiovascular: Negative.  Negative for chest pain and palpitations.  Gastrointestinal: Negative.  Negative for abdominal pain, blood in stool, diarrhea, melena, nausea and vomiting.  Genitourinary: Negative.  Negative for dysuria and hematuria.  Musculoskeletal:  Positive for back pain.  Skin: Negative.  Negative for rash.  Neurological:  Negative for dizziness and headaches.  All other systems reviewed and are negative.  Today's Vitals   08/19/21 1306  BP: 130/80  Pulse: 77  Temp: 98.4 F (36.9 C)  TempSrc: Oral  SpO2: 98%   Weight: 170 lb (77.1 kg)  Height: 5\' 5"  (1.651 m)   Body mass index is 28.29 kg/m.  Physical Exam Vitals reviewed.  Constitutional:      Appearance: Normal appearance.  HENT:     Head: Normocephalic.     Right Ear: Tympanic membrane, ear canal and external ear normal.     Left Ear: Tympanic membrane, ear canal and external ear normal.     Mouth/Throat:     Mouth: Mucous membranes are moist.     Pharynx: Oropharynx is clear.  Eyes:     Extraocular Movements: Extraocular movements intact.     Conjunctiva/sclera: Conjunctivae normal.     Pupils: Pupils are equal, round, and reactive to light.  Cardiovascular:     Rate and Rhythm: Normal rate and regular rhythm.     Pulses: Normal pulses.  Heart sounds: Normal heart sounds.  Pulmonary:     Effort: Pulmonary effort is normal.     Breath sounds: Normal breath sounds.  Abdominal:     General: Bowel sounds are normal. There is no distension.     Palpations: Abdomen is soft. There is no mass.     Tenderness: There is no abdominal tenderness.  Musculoskeletal:        General: Normal range of motion.     Cervical back: Normal range of motion and neck supple. No tenderness.     Right lower leg: No edema.     Left lower leg: No edema.     Comments: Old surgical scar lumbar area. Mild tenderness to left paraspinal lumbar area  Lymphadenopathy:     Cervical: No cervical adenopathy.  Skin:    General: Skin is warm and dry.     Capillary Refill: Capillary refill takes less than 2 seconds.     Findings: No rash.  Neurological:     General: No focal deficit present.     Mental Status: He is alert and oriented to person, place, and time.     Comments: Bilateral hyporeflexia both knees and ankles  Psychiatric:        Mood and Affect: Mood normal.        Behavior: Behavior normal.     ASSESSMENT & PLAN: Huck was seen today for annual exam.  Diagnoses and all orders for this visit:  Routine general medical examination at a  health care facility  Prostate cancer screening -     PSA(Must document that pt has been informed of limitations of PSA testing.)  Screening for deficiency anemia -     CBC with Differential  Screening for lipoid disorders -     Lipid panel  Screening for endocrine, metabolic and immunity disorder -     Comprehensive metabolic panel -     Hemoglobin A1c  Chronic left-sided low back pain with bilateral sciatica -     Ambulatory referral to Orthopedic Surgery  Modifiable risk factors discussed with patient. Anticipatory guidance according to age provided. The following topics were also discussed: Social Determinants of Health Smoking.  Non-smoker Diet and nutrition Benefits of exercise Cancer screening and review of most recent colonoscopy report Vaccinations recommendations Chronic lumbar pain and sciatica and need to follow-up with orthopedist Cardiovascular risk assessment Mental health including depression and anxiety Fall and accident prevention  Patient Instructions  Health Maintenance, Male Adopting a healthy lifestyle and getting preventive care are important in promoting health and wellness. Ask your health care provider about: The right schedule for you to have regular tests and exams. Things you can do on your own to prevent diseases and keep yourself healthy. What should I know about diet, weight, and exercise? Eat a healthy diet  Eat a diet that includes plenty of vegetables, fruits, low-fat dairy products, and lean protein. Do not eat a lot of foods that are high in solid fats, added sugars, or sodium. Maintain a healthy weight Body mass index (BMI) is a measurement that can be used to identify possible weight problems. It estimates body fat based on height and weight. Your health care provider can help determine your BMI and help you achieve or maintain a healthy weight. Get regular exercise Get regular exercise. This is one of the most important things you  can do for your health. Most adults should: Exercise for at least 150 minutes each week. The exercise should increase your  heart rate and make you sweat (moderate-intensity exercise). Do strengthening exercises at least twice a week. This is in addition to the moderate-intensity exercise. Spend less time sitting. Even light physical activity can be beneficial. Watch cholesterol and blood lipids Have your blood tested for lipids and cholesterol at 58 years of age, then have this test every 5 years. You may need to have your cholesterol levels checked more often if: Your lipid or cholesterol levels are high. You are older than 58 years of age. You are at high risk for heart disease. What should I know about cancer screening? Many types of cancers can be detected early and may often be prevented. Depending on your health history and family history, you may need to have cancer screening at various ages. This may include screening for: Colorectal cancer. Prostate cancer. Skin cancer. Lung cancer. What should I know about heart disease, diabetes, and high blood pressure? Blood pressure and heart disease High blood pressure causes heart disease and increases the risk of stroke. This is more likely to develop in people who have high blood pressure readings, are of African descent, or are overweight. Talk with your health care provider about your target blood pressure readings. Have your blood pressure checked: Every 3-5 years if you are 50-74 years of age. Every year if you are 82 years old or older. If you are between the ages of 21 and 49 and are a current or former smoker, ask your health care provider if you should have a one-time screening for abdominal aortic aneurysm (AAA). Diabetes Have regular diabetes screenings. This checks your fasting blood sugar level. Have the screening done: Once every three years after age 68 if you are at a normal weight and have a low risk for diabetes. More  often and at a younger age if you are overweight or have a high risk for diabetes. What should I know about preventing infection? Hepatitis B If you have a higher risk for hepatitis B, you should be screened for this virus. Talk with your health care provider to find out if you are at risk for hepatitis B infection. Hepatitis C Blood testing is recommended for: Everyone born from [birth year] through 1963. Anyone with known risk factors for hepatitis C. Sexually transmitted infections (STIs) You should be screened each year for STIs, including gonorrhea and chlamydia, if: You are sexually active and are younger than 58 years of age. You are older than 58 years of age and your health care provider tells you that you are at risk for this type of infection. Your sexual activity has changed since you were last screened, and you are at increased risk for chlamydia or gonorrhea. Ask your health care provider if you are at risk. Ask your health care provider about whether you are at high risk for HIV. Your health care provider may recommend a prescription medicine to help prevent HIV infection. If you choose to take medicine to prevent HIV, you should first get tested for HIV. You should then be tested every 3 months for as long as you are taking the medicine. Follow these instructions at home: Lifestyle Do not use any products that contain nicotine or tobacco, such as cigarettes, e-cigarettes, and chewing tobacco. If you need help quitting, ask your health care provider. Do not use street drugs. Do not share needles. Ask your health care provider for help if you need support or information about quitting drugs. Alcohol use Do not drink alcohol if your health care  provider tells you not to drink. If you drink alcohol: Limit how much you have to 0-2 drinks a day. Be aware of how much alcohol is in your drink. In the [place] one drink equals one 12 oz bottle of beer (355 mL), one 5 oz glass of wine (148 mL), or  one 1 oz glass of hard liquor (44 mL). General instructions Schedule regular health, dental, and eye exams. Stay current with your vaccines. Tell your health care provider if: You often feel depressed. You have ever been abused or do not feel safe at home. Summary Adopting a healthy lifestyle and getting preventive care are important in promoting health and wellness. Follow your health care provider's instructions about healthy diet, exercising, and getting tested or screened for diseases. Follow your health care provider's instructions on monitoring your cholesterol and blood pressure. This information is not intended to replace advice given to you by your health care provider. Make sure you discuss any questions you have with your health care provider. Document Revised: 12/21/2020 Document Reviewed: 10/06/2018 Elsevier Patient Education  2022 [place], MD [facility]

## 2021-09-02 ENCOUNTER — Encounter: Payer: Managed Care, Other (non HMO) | Admitting: Family

## 2021-09-24 ENCOUNTER — Encounter: Payer: Self-pay | Admitting: Orthopaedic Surgery

## 2021-09-24 ENCOUNTER — Other Ambulatory Visit: Payer: Self-pay

## 2021-09-24 ENCOUNTER — Ambulatory Visit (INDEPENDENT_AMBULATORY_CARE_PROVIDER_SITE_OTHER): Payer: Managed Care, Other (non HMO)

## 2021-09-24 ENCOUNTER — Ambulatory Visit: Payer: Self-pay

## 2021-09-24 ENCOUNTER — Other Ambulatory Visit: Payer: Self-pay | Admitting: Internal Medicine

## 2021-09-24 ENCOUNTER — Ambulatory Visit: Payer: Managed Care, Other (non HMO) | Admitting: Orthopaedic Surgery

## 2021-09-24 VITALS — BP 139/93 | Ht 65.0 in | Wt 170.0 lb

## 2021-09-24 DIAGNOSIS — G8929 Other chronic pain: Secondary | ICD-10-CM | POA: Diagnosis not present

## 2021-09-24 DIAGNOSIS — M545 Low back pain, unspecified: Secondary | ICD-10-CM | POA: Diagnosis not present

## 2021-09-24 DIAGNOSIS — M25562 Pain in left knee: Secondary | ICD-10-CM | POA: Diagnosis not present

## 2021-09-24 DIAGNOSIS — M25561 Pain in right knee: Secondary | ICD-10-CM

## 2021-09-24 DIAGNOSIS — M5431 Sciatica, right side: Secondary | ICD-10-CM | POA: Diagnosis not present

## 2021-09-24 DIAGNOSIS — K5904 Chronic idiopathic constipation: Secondary | ICD-10-CM

## 2021-09-24 DIAGNOSIS — Z981 Arthrodesis status: Secondary | ICD-10-CM

## 2021-09-24 DIAGNOSIS — R140 Abdominal distension (gaseous): Secondary | ICD-10-CM

## 2021-09-24 DIAGNOSIS — K219 Gastro-esophageal reflux disease without esophagitis: Secondary | ICD-10-CM

## 2021-09-24 DIAGNOSIS — M48061 Spinal stenosis, lumbar region without neurogenic claudication: Secondary | ICD-10-CM

## 2021-09-24 MED ORDER — METHOCARBAMOL 500 MG PO TABS: ORAL_TABLET | ORAL | 0 refills | Status: DC

## 2021-09-24 NOTE — Addendum Note (Signed)
Addended by: Meyer Cory on: 09/24/2021 09:42 AM   Modules accepted: Orders

## 2021-09-24 NOTE — Progress Notes (Addendum)
z  Office Visit Note   Patient: [name]           Date of Birth: 1960-12-22           MRN: [id number] Visit Date: 09/24/2021              Requested by: Horald Pollen, [place],  [place] [id number] PCP: Horald Pollen, MD   Assessment & Plan: Visit Diagnoses:  1. Acute left-sided low back pain, unspecified whether sciatica present   2. Chronic pain of both knees   3. History of fusion of lumbar spine   4. Sciatica of right side   5. Spinal stenosis of lumbar region, unspecified whether neurogenic claudication present     Plan: Patient has adjacent level degeneration L3-4 now 17 years after fusion L4-5.  We will set him up for some physical therapy follow-up in 6 weeks.  If he does not respond to therapy he will need MRI scan.  His symptoms are gradually progressing making work more difficult for him.  Follow-Up Instructions: Return in about 6 weeks (around 11/05/2021).   Orders:  Orders Placed This Encounter  Procedures   XR Lumbar Spine 2-3 Views   XR KNEE 3 VIEW LEFT   XR KNEE 3 VIEW RIGHT   Meds ordered this encounter  Medications   methocarbamol (ROBAXIN) 500 MG tablet    Sig: TAKE ONE TABLET BY MOUTH EVERY MORNING AND AFTERNOON. TAKE TWO TABLETS BY MOUTH EVERY NIGHT AT BEDTIME AS NEEDED FOR MUSCLE RELAXATION    Dispense:  40 tablet    Refill:  0       Procedures: No procedures performed   Clinical Data: No additional findings.   Subjective: Chief Complaint  Patient presents with   Lower Back - Pain   Right Knee - Pain   Left Knee - Pain    HPI 60 year old male reestablished patient who had single level L4-5 fusion by me in 2006 which was 17 years ago.  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  His fusion is completely healed and he still been working at [employer] where he is on his feet during his shift.  He has gradually  had increased pain in his back into his thighs pain around his knees.  He denies numbness or tingling.  Pain is worse when he is on his feet longer.  Time he gets some relief sitting down.  He has some pain in his knees going up and down stairs.  He is taken some Robaxin and thinks it might of helped to some degree.  Other problems include bipolar disorder glaucoma and high cholesterol.  Review of Systems negative for chills or fever.  Positive bipolar disorder on medication.   Objective: Vital Signs: BP (!) 139/93   Ht 5\' 5"  (1.651 m)   Wt 170 lb (77.1 kg)   BMI 28.29 kg/m   Physical Exam Constitutional:      Appearance: He is well-developed.  HENT:     Head: Normocephalic and atraumatic.     Right Ear: External ear normal.     Left Ear: External ear normal.  Eyes:     Pupils: Pupils are equal, round, and reactive to light.  Neck:     Thyroid: No thyromegaly.     Trachea: No tracheal deviation.  Cardiovascular:     Rate and Rhythm: Normal  rate.  Pulmonary:     Effort: Pulmonary effort is normal.     Breath sounds: No wheezing.  Abdominal:     General: Bowel sounds are normal.     Palpations: Abdomen is soft.  Musculoskeletal:     Cervical back: Neck supple.  Skin:    General: Skin is warm and dry.     Capillary Refill: Capillary refill takes less than 2 seconds.  Neurological:     Mental Status: He is alert and oriented to person, place, and time.  Psychiatric:        Behavior: Behavior normal.        Thought Content: Thought content normal.        Judgment: Judgment normal.    Ortho Exam patient gets from sitting standing able to heel and toe walk.  Lower extremity reflexes are 2+ symmetrical medial joint line tenderness right left knee mild crepitus with knee extension.  Well-healed lumbar incision and right posterior iliac bone graft incision.  Adductors are strong as well as abductors.  Specialty Comments:  No specialty comments available.  Imaging: XR KNEE 3  VIEW LEFT  Result Date: 09/24/2021 Standing AP both knees lateral left knee sunrise patella x-ray demonstrates mild medial joint line narrowing minimal spurring mild patellofemoral spurring. 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  XR KNEE 3 VIEW RIGHT  Result Date: 09/24/2021 Standing x-rays both knees lateral right knee sunrise patella x-ray demonstrates mild spurring patellofemoral joint medial compartment right knee which is symmetrical with the left. Impression: Mild osteoarthritis medial patellofemoral compartment with mild spurring.  XR Lumbar Spine 2-3 Views  Result Date: 09/24/2021 AP lateral lumbar images are obtained and reviewed this shows well-healed L4-5 interbody fusion with instrumentation.  Patient has adjacent level collapse of the L3-4 space with 2 mm disc space endplate spurring anterior and posteriorly and facet arthropathy. Impression: Previous L4-5 fusion with adjacent level L3-4 disc degeneration.    PMFS History: Patient Active Problem List   Diagnosis Date Noted   History of fusion of lumbar spine 09/24/2021   Spinal stenosis of lumbar region 09/24/2021   Prolapsed internal hemorrhoids, grade 3, s/p [facility] ligation/pexy 03/07/2016 03/07/2016   Depression 08/16/2015   Erectile dysfunction 04/25/2015   Disorder of liver 10/13/2011   HYPERCHOLESTEROLEMIA 03/11/2010   GLAUCOMA 09/03/2009   THYROID NODULE 08/28/2008   Thyroiditis 12/13/2007   BIPOLAR DISORDER UNSPECIFIED 12/13/2007   Past Medical History:  Diagnosis Date   Anxiety    ARM PAIN, LEFT 11/20/2009   Arthritis    BIPOLAR DISORDER UNSPECIFIED 12/13/2007   patient denies   Blood transfusion without reported diagnosis    CHEST PAIN 03/23/2009   currently having    DEPRESSION 12/13/2007   FATIGUE 03/23/2009   GERD (gastroesophageal reflux disease)    GLAUCOMA 09/03/2009   Headache    HEMORRHOIDS 09/03/2009   HOARSENESS 09/03/2009   HYPERCHOLESTEROLEMIA 03/11/2010    MYALGIA 08/13/2009   NECK PAIN 01/18/2009   Palpitations 03/23/2009   Polydipsia 01/24/2008   Shortness of breath 03/23/2009   THYROID NODULE 08/28/2008   benign on Bx   THYROIDITIS 12/13/2007   Tubular adenoma of colon     Family History  Problem Relation Age of Onset   Cancer Father 56       colon   Colon cancer Father    Dementia Mother    Hypertension Other     Past Surgical History:  Procedure Laterality Date   BACK SURGERY  x 3, lumbar   EYE SURGERY     2008   FINGER SURGERY     TRANSANAL HEMORRHOIDAL DEARTERIALIZATION N/A 03/07/2016   Procedure: TRANSANAL HEMORRHOIDAL DEARTERIALIZATION HEMORRHOIDAL LIGATION/PEXY EXAM UNDER ANESTHESIA WITH  POSSIBLE HEMORRHOIDECTOMY ;  Surgeon: Michael Boston, MD;  Location: [facility];  Service: General;  Laterality: N/A;   Social History   Occupational History   Occupation: Museum/gallery conservator at [employer]  Tobacco Use   Smoking status: Never   Smokeless tobacco: Never  Vaping Use   Vaping Use: Never used  Substance and Sexual Activity   Alcohol use: No    Alcohol/week: 0.0 standard drinks   Drug use: No   Sexual activity: Not on file

## 2021-10-07 ENCOUNTER — Telehealth: Payer: Self-pay | Admitting: Orthopaedic Surgery

## 2021-10-07 NOTE — Telephone Encounter (Signed)
Pt previously saw Dr. Lorin Mercy and was told to attend P.T then come back for a 6 month appt but pt states his copay for P.T would be $100 per visit and he can not afford that. Since his next appt in Jan was for a 6 week follow up, pt stated he wanted to cancel that appt at this time and will call back to resch later on. The best call back number is [phone].

## 2021-10-07 NOTE — Telephone Encounter (Signed)
FYI

## 2021-10-29 ENCOUNTER — Ambulatory Visit: Payer: Managed Care, Other (non HMO) | Admitting: Endocrinology

## 2021-11-04 ENCOUNTER — Ambulatory Visit: Payer: Managed Care, Other (non HMO) | Admitting: Endocrinology

## 2021-11-04 ENCOUNTER — Other Ambulatory Visit: Payer: Self-pay

## 2021-11-04 VITALS — BP 140/82 | HR 91 | Ht 65.0 in | Wt 167.4 lb

## 2021-11-04 DIAGNOSIS — E041 Nontoxic single thyroid nodule: Secondary | ICD-10-CM

## 2021-11-04 LAB — TSH: TSH: 1.25 u[IU]/mL (ref 0.35–5.50)

## 2021-11-04 NOTE — Patient Instructions (Signed)
Blood tests are requested for you today.  We'll let you know about the results.  Please see an ear-nose-throat specialist.  you will receive a phone call, about a day and time for an appointment.

## 2021-11-04 NOTE — Progress Notes (Signed)
Subjective:    Patient ID: [name], male    DOB: [birth year], 61 y.o.   MRN: [id number]  HPI Pt returns for f/u of thyroid nodule ([place] (2019): greater than 5 year stability of the nodule versus pseudonodule in the right inferior gland consistent with a benign process). He did not see ENT, due to high copay, and neck sxs resolved.  He now has 1 month of sensation of a FB in the throat.   Past Medical History:  Diagnosis Date   Anxiety    ARM PAIN, LEFT 11/20/2009   Arthritis    BIPOLAR DISORDER UNSPECIFIED 12/13/2007   patient denies   Blood transfusion without reported diagnosis    CHEST PAIN 03/23/2009   currently having    DEPRESSION 12/13/2007   FATIGUE 03/23/2009   GERD (gastroesophageal reflux disease)    GLAUCOMA 09/03/2009   Headache    HEMORRHOIDS 09/03/2009   HOARSENESS 09/03/2009   HYPERCHOLESTEROLEMIA 03/11/2010   MYALGIA 08/13/2009   NECK PAIN 01/18/2009   Palpitations 03/23/2009   Polydipsia 01/24/2008   Shortness of breath 03/23/2009   THYROID NODULE 08/28/2008   benign on Bx   THYROIDITIS 12/13/2007   Tubular adenoma of colon     Past Surgical History:  Procedure Laterality Date   BACK SURGERY     x 3, lumbar   EYE SURGERY     2008   FINGER SURGERY     TRANSANAL HEMORRHOIDAL DEARTERIALIZATION N/A 03/07/2016   Procedure: TRANSANAL HEMORRHOIDAL DEARTERIALIZATION HEMORRHOIDAL LIGATION/PEXY EXAM UNDER ANESTHESIA WITH  POSSIBLE HEMORRHOIDECTOMY ;  Surgeon: Michael Boston, MD;  Location: [facility];  Service: General;  Laterality: N/A;    Social History   Socioeconomic History   Marital status: Divorced    Spouse name: Not on file   Number of children: 1   Years of education: Not on file   Highest education level: Not on file  Occupational History   Occupation: Museum/gallery conservator at [employer] Use   Smoking status: Never   Smokeless tobacco: Never  Vaping Use   Vaping Use: Never used  Substance and Sexual Activity   Alcohol use: No    Alcohol/week: 0.0 standard  drinks   Drug use: No   Sexual activity: Not on file  Other Topics Concern   Not on file  Social History Narrative   Mr Kaser lives with his wife in [place], 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       He is originally form [place]   Social Determinants of Sales executive: Not on [employer] Insecurity: Not on file  Transportation Needs: Not on file  Physical Activity: Not on file  Stress: Not on file  Social Connections: Not on file  Intimate Partner Violence: Not on file    Current Outpatient Medications on File Prior to Visit  Medication Sig Dispense Refill   atorvastatin (LIPITOR) 20 MG tablet Take 1 tablet (20 mg total) by mouth daily. 90 tablet 2   clindamycin-benzoyl peroxide (BENZACLIN) gel Apply topically 2 (two) times daily. 25 g 0   clotrimazole-betamethasone (LOTRISONE) cream APPLY ONE APPLICATION TOPICALLY once daily 30 g 0   diclofenac Sodium (VOLTAREN) 1 % GEL Apply 2 g topically 4 (four) times daily. 100 g 5   escitalopram (LEXAPRO) 10 MG tablet Take 10 mg by mouth daily.     fluticasone (FLONASE) 50 MCG/ACT nasal spray Place 1 spray into both nostrils 2 (two) times daily. 16 g  6   HYDROcodone-acetaminophen (NORCO) 5-325 MG tablet Take 1 tablet by mouth every 6 (six) hours as needed for moderate pain. 30 tablet 0   losartan-hydrochlorothiazide (HYZAAR) 50-12.5 MG tablet Take 0.5 tablets by mouth daily. 45 tablet 0   methocarbamol (ROBAXIN) 500 MG tablet TAKE ONE TABLET BY MOUTH EVERY MORNING AND AFTERNOON. TAKE TWO TABLETS BY MOUTH EVERY NIGHT AT BEDTIME AS NEEDED FOR MUSCLE RELAXATION 40 tablet 0   mirtazapine (REMERON) 45 MG tablet      Multiple Vitamin (MULTIVITAMIN) tablet Take 1 tablet by mouth daily.     omeprazole (PRILOSEC) 40 MG capsule Take 1 capsule (40 mg total) by mouth daily. 30 capsule 10   Plecanatide (TRULANCE) 3 MG TABS Take 1 tablet by mouth daily. **PLEASE CONTACT OFFICE TO SCHEDULE FOLLOW UP 90 tablet 0    sildenafil (VIAGRA) 100 MG tablet Take 0.5-1 tablets (50-100 mg total) by mouth daily as needed for erectile dysfunction. 30 tablet 3   Travoprost (TRAVATAN OP) Apply to eye at bedtime.     Travoprost, BAK Free, (TRAVATAN) 0.004 % SOLN ophthalmic solution      Vilazodone HCl (VIIBRYD) 10 MG TABS Take 10 mg by mouth daily as needed. (Patient not taking: Reported on 11/04/2021)     No current facility-administered medications on file prior to visit.    No Known Allergies  Family History  Problem Relation Age of Onset   Cancer Father 21       colon   Colon cancer Father    Dementia Mother    Hypertension Other     BP 140/82    Pulse 91    Ht 5\' 5"  (1.651 m)    Wt 167 lb 6.4 oz (75.9 kg)    SpO2 96%    BMI 27.86 kg/m   Review of Systems     Objective:   Physical Exam VITAL SIGNS:  See vs page.   GENERAL: no distress.   NECK: thyroid is slightly enlarged, but I cannot appreciate a nodule.        Assessment & Plan:  Throat sensation, uncertain etiology and prognosis.  Thyr nod.  Due for f/u labs.  No medication is needed.   Patient Instructions  Blood tests are requested for you today.  We'll let you know about the results.  Please see an ear-nose-throat specialist.  you will receive a phone call, about a day and time for an appointment.

## 2021-11-05 ENCOUNTER — Ambulatory Visit: Payer: Managed Care, Other (non HMO) | Admitting: Orthopaedic Surgery

## 2021-12-30 ENCOUNTER — Telehealth: Payer: Self-pay

## 2021-12-30 NOTE — Telephone Encounter (Signed)
Spoke with pt and he stated that he has not heard any thing regarding the referral that you placed so he would like to be referred to Dr. Benjamine Mola on [facility] since he has been to him before. ?

## 2021-12-31 NOTE — Telephone Encounter (Signed)
Referral was placed to a provider that is no longer practicing.  Updated referral and faxed. ?

## 2022-01-06 ENCOUNTER — Ambulatory Visit: Payer: Managed Care, Other (non HMO) | Admitting: Physician Assistant

## 2022-01-23 ENCOUNTER — Ambulatory Visit: Payer: Managed Care, Other (non HMO) | Admitting: Emergency Medicine

## 2022-01-30 ENCOUNTER — Encounter: Payer: Self-pay | Admitting: Nurse Practitioner

## 2022-01-30 ENCOUNTER — Ambulatory Visit: Payer: Managed Care, Other (non HMO) | Admitting: Nurse Practitioner

## 2022-01-30 VITALS — BP 136/84 | HR 80 | Temp 98.3°F | Ht 65.0 in | Wt 161.4 lb

## 2022-01-30 DIAGNOSIS — R011 Cardiac murmur, unspecified: Secondary | ICD-10-CM | POA: Diagnosis not present

## 2022-01-30 DIAGNOSIS — H9313 Tinnitus, bilateral: Secondary | ICD-10-CM | POA: Insufficient documentation

## 2022-01-30 DIAGNOSIS — M5431 Sciatica, right side: Secondary | ICD-10-CM | POA: Diagnosis not present

## 2022-01-30 DIAGNOSIS — R634 Abnormal weight loss: Secondary | ICD-10-CM | POA: Diagnosis not present

## 2022-01-30 LAB — COMPREHENSIVE METABOLIC PANEL
ALT: 38 U/L (ref 0–53)
AST: 31 U/L (ref 0–37)
Albumin: 4.7 g/dL (ref 3.5–5.2)
Alkaline Phosphatase: 115 U/L (ref 39–117)
BUN: 19 mg/dL (ref 6–23)
CO2: 28 mEq/L (ref 19–32)
Calcium: 9.1 mg/dL (ref 8.4–10.5)
Chloride: 104 mEq/L (ref 96–112)
Creatinine, Ser: 0.85 mg/dL (ref 0.40–1.50)
GFR: 95.74 mL/min (ref >60.00)
Glucose, Bld: 104 mg/dL — ABNORMAL HIGH (ref 70–99)
Potassium: 4 mEq/L (ref 3.5–5.1)
Sodium: 139 mEq/L (ref 135–145)
Total Bilirubin: 0.5 mg/dL (ref 0.2–1.2)
Total Protein: 7.3 g/dL (ref 6.0–8.3)

## 2022-01-30 LAB — HEMOGLOBIN A1C: Hgb A1c MFr Bld: 6.4 % (ref 4.6–6.5)

## 2022-01-30 LAB — CBC
HCT: 41.2 % (ref 39.0–52.0)
Hemoglobin: 13.7 g/dL (ref 13.0–17.0)
MCHC: 33.3 g/dL (ref 30.0–36.0)
MCV: 86.8 fl (ref 78.0–100.0)
Platelets: 249 10*3/uL (ref 150.0–400.0)
RBC: 4.75 Mil/uL (ref 4.22–5.81)
RDW: 13.7 % (ref 11.5–15.5)
WBC: 5.5 10*3/uL (ref 4.0–10.5)

## 2022-01-30 LAB — POC HEMOCCULT BLD/STL (OFFICE/1-CARD/DIAGNOSTIC): Fecal Occult Blood, POC: NEGATIVE

## 2022-01-30 LAB — TSH: TSH: 2.07 u[IU]/mL (ref 0.35–5.50)

## 2022-01-30 MED ORDER — METHOCARBAMOL 500 MG PO TABS: ORAL_TABLET | ORAL | 0 refills | Status: DC

## 2022-01-30 MED ORDER — CLOTRIMAZOLE-BETAMETHASONE 1-0.05 % EX CREA: TOPICAL_CREAM | CUTANEOUS | 0 refills | Status: DC

## 2022-01-30 MED ORDER — HYDROCODONE-ACETAMINOPHEN 5-325 MG PO TABS: 1.0000 | ORAL_TABLET | Freq: Three times a day (TID) | ORAL | 0 refills | Status: AC | PRN

## 2022-01-30 NOTE — Assessment & Plan Note (Signed)
Acute, etiology unclear.  Recommend he discuss this with ENT when he sees him next month.  Could consider recommending white noise machine if this affects his sleep, however he denied any insomnia at this time. ?

## 2022-01-30 NOTE — Assessment & Plan Note (Signed)
Incidentally noted on exam.  He denies knowledge of murmur, appears to be asymptomatic.  Referral to cardiology made today for further evaluation. ?

## 2022-01-30 NOTE — Assessment & Plan Note (Signed)
Chronic, stable.  He has been experiencing what sounds like possible neuropathy in bilateral feet.  I think it is more likely related to his sciatica and low back pain.  He is requesting refill on his pain medication that he has taken in the past (methocarbamol as well as hydrocodone-acetaminophen), [place] controlled substance database reviewed.  We will provide him with 3-day supply of hydrocodone with acetaminophen as well as refill of methocarbamol.  I to encouraged him to consider physical therapy if he is able to afford this or attend.  In the meantime we discussed general good practices with lifting heavy objects, placing a pillow in between his knees with sleeping, and avoiding over extension of the back.  He is agreeable to this, encouraged to let us know if symptoms persist or worsen. ?

## 2022-01-30 NOTE — Progress Notes (Signed)
? ? ? ?Subjective:  ?Patient ID: [name], male    DOB: 13-Jul-1961  Age: 61 y.o. MRN: [id number] ? ?CC:  ?Chief Complaint  ?Patient presents with  ? Weight Loss  ? throat pain   ?  Feels like something is stuck in his throat   ? Tinnitus  ?  Pt feels like his ears are ringing ?  ?  ? ? ?HPI  ?This patient arrives today for the above. ? ?Weight loss: Patient reports that he has noticed that his face looks thinner and that his close are fitting looser.  He tells me that he has made dietary changes recently because at his annual physical in October 2022 he was found to be prediabetic.  He has tried over-the-counter diet pill as well as reduce intake of carbohydrates.  He also mentions a feeling of a foreign substance in his throat when he swallows intermittently.  He does have a history of thyroid nodules and is being evaluated by endocrinology as well as had referral to ENT for this.  He has not been seen by ENT yet but reports office visit is next month.  He has no pain with swallowing and no difficulty with swallowing or coughing/choking when eating or drinking.  He is up-to-date with colon cancer screening (he is recommended to go every 3 years, he has history of not advanced adenomas), he does report dark stools in the office today he denies any iron supplements.  He is up-to-date with prostate cancer screening (0.53 in 10/22, 0.57 in 9/19, and 0.48 in 8/18).  Per chart review he has had a 9 pound weight loss over the last 5 months.  Prior to that weight was stable at around 170. ? ?Tinnitus: He noted this about 3 weeks ago.  It is continuous and sounds like either water drops or a whistle blowing.  He denies any vertigo, but does report sometimes he will feel a little bit dizzy.  The dizziness seems to spontaneously resolve within a few moments, no specific triggers identified today. ? ?Numbness in bilateral feet: He has been experiencing burning in his feet for greater than 1 year.  Triggers for this  include sitting/laying down as well as when he has a bowel movement or urinates.  He denies any incontinence of urine or bowels.  He does have a history of low back pain and has had 3 lumbar back surgeries in the past.  He has seen his neurosurgeon for his back pain and was told to go to physical therapy for sciatica, however he cannot afford time away from work nor can he afford the treatments themselves.  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  He denies any weakness in his lower extremities.  He is concerned he may have neuropathy from diabetes. ? ?Past Medical History:  ?Diagnosis Date  ? Anxiety   ? ARM PAIN, LEFT 11/20/2009  ? Arthritis   ? BIPOLAR DISORDER UNSPECIFIED 12/13/2007  ? patient denies  ? Blood transfusion without reported diagnosis   ? CHEST PAIN 03/23/2009  ? currently having   ? DEPRESSION 12/13/2007  ? FATIGUE 03/23/2009  ? GERD (gastroesophageal reflux disease)   ? GLAUCOMA 09/03/2009  ? Headache   ? HEMORRHOIDS 09/03/2009  ? HOARSENESS 09/03/2009  ? HYPERCHOLESTEROLEMIA 03/11/2010  ? MYALGIA 08/13/2009  ? NECK PAIN 01/18/2009  ? Palpitations 03/23/2009  ? Polydipsia 01/24/2008  ? Shortness of breath 03/23/2009  ? THYROID NODULE 08/28/2008  ? benign on Bx  ? THYROIDITIS  12/13/2007  ? Tubular adenoma of colon   ? ? ? ? ?Family History  ?Problem Relation Age of Onset  ? Cancer Father 66  ?     colon  ? Colon cancer Father   ? Dementia Mother   ? Hypertension Other   ? ? ?Social History  ? ?Social History Narrative  ? Mr Bautch lives with his wife in [place], 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   ?   ? He is originally form [place]  ? ?Social History  ? ?Tobacco Use  ? Smoking status: Never  ? Smokeless tobacco: Never  ?Substance Use Topics  ? Alcohol use: No  ?  Alcohol/week: 0.0 standard drinks  ? ? ? ?Current Meds  ?Medication Sig  ? atorvastatin (LIPITOR) 20 MG tablet Take 1 tablet (20 mg total) by mouth daily.  ? clindamycin-benzoyl peroxide (BENZACLIN) gel Apply topically 2 (two)  times daily.  ? diclofenac Sodium (VOLTAREN) 1 % GEL Apply 2 g topically 4 (four) times daily.  ? escitalopram (LEXAPRO) 10 MG tablet Take 10 mg by mouth daily.  ? fluticasone (FLONASE) 50 MCG/ACT nasal spray Place 1 spray into both nostrils 2 (two) times daily.  ? losartan-hydrochlorothiazide (HYZAAR) 50-12.5 MG tablet Take 0.5 tablets by mouth daily.  ? mirtazapine (REMERON) 45 MG tablet   ? Multiple Vitamin (MULTIVITAMIN) tablet Take 1 tablet by mouth daily.  ? omeprazole (PRILOSEC) 40 MG capsule Take 1 capsule (40 mg total) by mouth daily.  ? Plecanatide (TRULANCE) 3 MG TABS Take 1 tablet by mouth daily. **PLEASE CONTACT OFFICE TO SCHEDULE FOLLOW UP  ? sildenafil (VIAGRA) 100 MG tablet Take 0.5-1 tablets (50-100 mg total) by mouth daily as needed for erectile dysfunction.  ? Travoprost (TRAVATAN OP) Apply to eye at bedtime.  ? Travoprost, BAK Free, (TRAVATAN) 0.004 % SOLN ophthalmic solution   ? Vilazodone HCl (VIIBRYD) 10 MG TABS Take 10 mg by mouth daily as needed.  ? [DISCONTINUED] clotrimazole-betamethasone (LOTRISONE) cream APPLY ONE APPLICATION TOPICALLY once daily  ? [DISCONTINUED] HYDROcodone-acetaminophen (NORCO) 5-325 MG tablet Take 1 tablet by mouth every 6 (six) hours as needed for moderate pain.  ? [DISCONTINUED] methocarbamol (ROBAXIN) 500 MG tablet TAKE ONE TABLET BY MOUTH EVERY MORNING AND AFTERNOON. TAKE TWO TABLETS BY MOUTH EVERY NIGHT AT BEDTIME AS NEEDED FOR MUSCLE RELAXATION  ? ? ?ROS:  ?Review of Systems  ?HENT:  Positive for hearing loss and tinnitus.   ?Respiratory:  Negative for cough and shortness of breath.   ?Cardiovascular:  Positive for chest pain.  ?Gastrointestinal:  Positive for melena. Negative for blood in stool.  ?     (-) odynophagia, (-) dysphagia, (+) sensation of foreign body in throat intermittently  ?Neurological:  Positive for dizziness (vertigo, but somes and goes).  ? ? ?Objective:  ? ?Today's Vitals: BP 136/84   Pulse 80   Temp 98.3 ?F (36.8 ?C)   Ht '5\' 5"'$  (1.651  m)   Wt 161 lb 6 oz (73.2 kg)   SpO2 92%   BMI 26.85 kg/m?  ? ?  01/30/2022  ? 10:20 AM 11/04/2021  ?  1:03 PM 09/24/2021  ?  8:50 AM  ?Vitals with BMI  ?Height '5\' 5"'$  '5\' 5"'$  '5\' 5"'$   ?Weight 161 lbs 6 oz 167 lbs 6 oz 170 lbs  ?BMI 26.85 27.86 28.29  ?Systolic 443 154 008  ?Diastolic 84 82 93  ?Pulse 80 91   ?  ? ?Physical Exam ?Vitals reviewed. Exam conducted  with a chaperone present.  ?Constitutional:   ?   Appearance: Normal appearance.  ?HENT:  ?   Head: Normocephalic and atraumatic.  ?   Right Ear: Hearing, ear canal and external ear normal. There is impacted cerumen.  ?   Left Ear: Hearing, tympanic membrane, ear canal and external ear normal.  ?Cardiovascular:  ?   Rate and Rhythm: Normal rate and regular rhythm.  ?   Pulses:     ?     Dorsalis pedis pulses are 2+ on the right side and 2+ on the left side.  ?   Heart sounds: Murmur heard.  ?Pulmonary:  ?   Effort: Pulmonary effort is normal.  ?   Breath sounds: Normal breath sounds.  ?Genitourinary: ?   Prostate: Normal.  ?   Rectum: Normal. Guaiac result negative. No mass or tenderness. Normal anal tone.  ?Musculoskeletal:  ?   Cervical back: Normal and neck supple.  ?   Thoracic back: Normal.  ?   Lumbar back: Normal.  ?   Right foot: Normal.  ?   Left foot: Normal.  ?Feet:  ?   Right foot:  ?   Protective Sensation: 10 sites tested.  10 sites sensed.  ?   Skin integrity: Skin integrity normal.  ?   Toenail Condition: Right toenails are normal.  ?   Left foot:  ?   Protective Sensation: 10 sites tested.  10 sites sensed.  ?   Skin integrity: Skin integrity normal.  ?   Toenail Condition: Left toenails are normal.  ?Skin: ?   General: Skin is warm and dry.  ?Neurological:  ?   Mental Status: He is alert and oriented to person, place, and time.  ?   Sensory: Sensation is intact.  ?   Motor: Motor function is intact.  ?   Gait: Gait is intact.  ?Psychiatric:     ?   Mood and Affect: Mood normal.     ?   Behavior: Behavior normal.     ?   Thought Content: Thought  content normal.     ?   Judgment: Judgment normal.  ? ? ? ? ? ? ? ?Assessment and Plan  ? ?1. Unintentional weight loss   ?2. Sciatica of right side   ?3. Murmur   ?4. Tinnitus of both ears   ? ? ? ?Plan: ?See plan

## 2022-01-30 NOTE — Assessment & Plan Note (Signed)
He has had about 9 pound weight loss over the last 5 months.  This does sound more intentional as opposed unintentional to me considering he has been taking over-the-counter diet medication and has reduced his carbohydrate intake.  Guaiac test in the office was negative as well today.  Patient offered reassurance.  We will also check some blood work for further evaluation, further recommendations may be made based upon these results. ?

## 2022-01-31 LAB — RPR: RPR Ser Ql: NONREACTIVE

## 2022-01-31 LAB — HIV ANTIBODY (ROUTINE TESTING W REFLEX): HIV 1&2 Ab, 4th Generation: NONREACTIVE

## 2022-02-03 ENCOUNTER — Other Ambulatory Visit: Payer: Self-pay

## 2022-02-03 ENCOUNTER — Emergency Department (HOSPITAL_COMMUNITY): Payer: Managed Care, Other (non HMO)

## 2022-02-03 ENCOUNTER — Encounter (HOSPITAL_COMMUNITY): Payer: Self-pay

## 2022-02-03 ENCOUNTER — Emergency Department (HOSPITAL_COMMUNITY)
Admission: EM | Admit: 2022-02-03 | Discharge: 2022-02-04 | Disposition: A | Discharge status: Home / Self Care | Payer: Managed Care, Other (non HMO) | Attending: Emergency Medicine | Admitting: Emergency Medicine

## 2022-02-03 DIAGNOSIS — H9311 Tinnitus, right ear: Secondary | ICD-10-CM | POA: Diagnosis present

## 2022-02-03 LAB — CBC WITH DIFFERENTIAL/PLATELET
Abs Immature Granulocytes: 0.01 10*3/uL (ref 0.00–0.07)
Basophils Absolute: 0 10*3/uL (ref 0.0–0.1)
Basophils Relative: 0 %
Eosinophils Absolute: 0.1 10*3/uL (ref 0.0–0.5)
Eosinophils Relative: 1 %
HCT: 41.1 % (ref 39.0–52.0)
Hemoglobin: 13.4 g/dL (ref 13.0–17.0)
Immature Granulocytes: 0 %
Lymphocytes Relative: 62 %
Lymphs Abs: 3.5 10*3/uL (ref 0.7–4.0)
MCH: 28.8 pg (ref 26.0–34.0)
MCHC: 32.6 g/dL (ref 30.0–36.0)
MCV: 88.2 fL (ref 80.0–100.0)
Monocytes Absolute: 0.4 10*3/uL (ref 0.1–1.0)
Monocytes Relative: 6 %
Neutro Abs: 1.8 10*3/uL (ref 1.7–7.7)
Neutrophils Relative %: 31 %
Platelets: 232 10*3/uL (ref 150–400)
RBC: 4.66 MIL/uL (ref 4.22–5.81)
RDW: 13.1 % (ref 11.5–15.5)
WBC: 5.8 10*3/uL (ref 4.0–10.5)
nRBC: 0 % (ref 0.0–0.2)

## 2022-02-03 LAB — COMPREHENSIVE METABOLIC PANEL
ALT: 41 U/L (ref 0–44)
AST: 33 U/L (ref 15–41)
Albumin: 3.9 g/dL (ref 3.5–5.0)
Alkaline Phosphatase: 112 U/L (ref 38–126)
Anion gap: 5 (ref 5–15)
BUN: 10 mg/dL (ref 6–20)
CO2: 27 mmol/L (ref 22–32)
Calcium: 8.8 mg/dL — ABNORMAL LOW (ref 8.9–10.3)
Chloride: 106 mmol/L (ref 98–111)
Creatinine, Ser: 0.8 mg/dL (ref 0.61–1.24)
GFR, Estimated: >60 mL/min (ref >60)
Glucose, Bld: 134 mg/dL — ABNORMAL HIGH (ref 70–99)
Potassium: 3.2 mmol/L — ABNORMAL LOW (ref 3.5–5.1)
Sodium: 138 mmol/L (ref 135–145)
Total Bilirubin: 0.7 mg/dL (ref 0.3–1.2)
Total Protein: 6.8 g/dL (ref 6.5–8.1)

## 2022-02-03 MED ORDER — POTASSIUM CHLORIDE CRYS ER 20 MEQ PO TBCR
40.0000 meq | EXTENDED_RELEASE_TABLET | Freq: Once | ORAL | Status: AC
  Administered 2022-02-04: 40 meq via ORAL
  Filled 2022-02-03: qty 2

## 2022-02-03 NOTE — ED Triage Notes (Signed)
Pt reports with weight loss over the past month, tinnitus in his right ear x 2 weeks, and palpitations at night.  ?

## 2022-02-03 NOTE — ED Provider Notes (Signed)
? ?[facility] DEPT ?Provider Note: Georgena Spurling, MD, FACEP ? ?CSN: [id number] ?MRN: [id number] ?ARRIVAL: 02/03/22 at [facility] ?ROOM: [facility] ? ? ?CHIEF COMPLAINT  ?Tinnitus ? ? ?HISTORY OF PRESENT ILLNESS  ?02/03/22 11:33 PM ?[name] is a 61 y.o. male who has had 2 weeks of tinnitus.  By tinnitus he means sometimes a whistling noise and sometimes a sensation of water droplets.  It was previously in both ears but is now in the right ear only.  He has also had a weight loss over the past 2 months.  He has a known thyroid nodule and feels a foreign body sensation in his throat, particularly at night, but denies any difficulty breathing or swallowing.  He also has had palpitations at night but he is vague about what this means.  He saw his primary care physician on January 30, 2022 who did lab work that was mostly unremarkable, including a normal TSH, but his A1c was mildly elevated at 6.4.  He denies salicylate ingestion.  He was referred to ENT for his tinnitus but cannot get in until May. ? ? ?Past Medical History:  ?Diagnosis Date  ? Anxiety   ? ARM PAIN, LEFT 11/20/2009  ? Arthritis   ? BIPOLAR DISORDER UNSPECIFIED 12/13/2007  ? patient denies  ? Blood transfusion without reported diagnosis   ? CHEST PAIN 03/23/2009  ? currently having   ? DEPRESSION 12/13/2007  ? FATIGUE 03/23/2009  ? GERD (gastroesophageal reflux disease)   ? GLAUCOMA 09/03/2009  ? Headache   ? HEMORRHOIDS 09/03/2009  ? HOARSENESS 09/03/2009  ? HYPERCHOLESTEROLEMIA 03/11/2010  ? MYALGIA 08/13/2009  ? NECK PAIN 01/18/2009  ? Palpitations 03/23/2009  ? Polydipsia 01/24/2008  ? Shortness of breath 03/23/2009  ? THYROID NODULE 08/28/2008  ? benign on Bx  ? THYROIDITIS 12/13/2007  ? Tubular adenoma of colon   ? ? ?Past Surgical History:  ?Procedure Laterality Date  ? BACK SURGERY    ? x 3, lumbar  ? EYE SURGERY    ? 2008  ? FINGER SURGERY    ? TRANSANAL HEMORRHOIDAL DEARTERIALIZATION N/A 03/07/2016  ? Procedure: TRANSANAL HEMORRHOIDAL DEARTERIALIZATION  HEMORRHOIDAL LIGATION/PEXY EXAM UNDER ANESTHESIA WITH  POSSIBLE HEMORRHOIDECTOMY ;  Surgeon: Michael Boston, MD;  Location: [facility];  Service: General;  Laterality: N/A;  ? ? ?Family History  ?Problem Relation Age of Onset  ? Cancer Father 74  ?     colon  ? Colon cancer Father   ? Dementia Mother   ? Hypertension Other   ? ? ?Social History  ? ?Tobacco Use  ? Smoking status: Never  ? Smokeless tobacco: Never  ?Vaping Use  ? Vaping Use: Never used  ?Substance Use Topics  ? Alcohol use: No  ?  Alcohol/week: 0.0 standard drinks  ? Drug use: No  ? ? ?Prior to Admission medications   ?Medication Sig Start Date End Date Taking? Authorizing Provider  ?atorvastatin (LIPITOR) 20 MG tablet Take 1 tablet (20 mg total) by mouth daily. 06/24/21   Horald Pollen, MD  ?clindamycin-benzoyl peroxide [facility]) gel Apply topically 2 (two) times daily. 05/28/20   Maximiano Coss, NP  ?diclofenac Sodium (VOLTAREN) 1 % GEL Apply 2 g topically 4 (four) times daily. 08/03/20   Daleen Squibb, MD  ?escitalopram (LEXAPRO) 10 MG tablet Take 10 mg by mouth daily.    [provider]  ?fluticasone (FLONASE) 50 MCG/ACT nasal spray Place 1 spray into both nostrils 2 (two) times daily. 08/19/21   Agustina Caroli  Jose, MD  ?losartan-hydrochlorothiazide (HYZAAR) 50-12.5 MG tablet Take 0.5 tablets by mouth daily. 03/29/21   Renato Shin, MD  ?methocarbamol (ROBAXIN) 500 MG tablet TAKE ONE TABLET BY MOUTH EVERY MORNING AND AFTERNOON. TAKE TWO TABLETS BY MOUTH EVERY NIGHT AT BEDTIME AS NEEDED FOR MUSCLE RELAXATION 01/30/22   Ailene Ards, NP  ?mirtazapine (REMERON) 45 MG tablet  06/02/17   [provider]  ?Multiple Vitamin (MULTIVITAMIN) tablet Take 1 tablet by mouth daily.    [provider]  ?omeprazole (PRILOSEC) 40 MG capsule Take 1 capsule (40 mg total) by mouth daily. 07/25/21   Horald Pollen, MD  ?Plecanatide (TRULANCE) 3 MG TABS Take 1 tablet by mouth daily. **PLEASE CONTACT OFFICE TO SCHEDULE FOLLOW UP  09/24/21   Pyrtle, Lajuan Lines, MD  ?sildenafil (VIAGRA) 100 MG tablet Take 0.5-1 tablets (50-100 mg total) by mouth daily as needed for erectile dysfunction. 08/19/21   Horald Pollen, MD  ?Travoprost (TRAVATAN OP) Apply to eye at bedtime.    [provider]  ?Travoprost, BAK Free, (TRAVATAN) 0.004 % SOLN ophthalmic solution  09/28/20   [provider]  ?Vilazodone HCl (VIIBRYD) 10 MG TABS Take 10 mg by mouth daily as needed. 09/04/21   [provider]  ? ? ?Allergies ?Patient has no known allergies. ? ? ?REVIEW OF SYSTEMS  ?Negative except as noted here or in the History of Present Illness. ? ? ?PHYSICAL EXAMINATION  ?Initial Vital Signs ?Blood pressure (!) 165/83, pulse 77, temperature 97.6 ?F (36.4 ?C), temperature source Oral, resp. rate (!) 22, height '5\' 5"'$  (1.651 m), weight 73 kg, SpO2 99 %. ? ?Examination ?General: Well-developed, well-nourished male in no acute distress; appearance consistent with age of record ?HENT: normocephalic; atraumatic; no palpable thyroid nodule or thyromegaly; left TM normal, right TM partly obscured by cerumen ?Eyes: pupils equal, round and reactive to light; extraocular muscles intact; right pterygium ?Neck: supple ?Heart: regular rate and rhythm ?Lungs: clear to auscultation bilaterally ?Abdomen: soft; nondistended; nontender; bowel sounds present ?Extremities: No deformity; full range of motion; pulses normal ?Neurologic: Awake, alert and oriented; motor function intact in all extremities and symmetric; no facial droop ?Skin: Warm and dry ?Psychiatric: Normal mood and affect ? ? ?RESULTS  ?Summary of this visit's results, reviewed and interpreted by myself: ? ? EKG Interpretation ? ?Date/Time:  Monday February 03 2022 20:02:50 EDT ?Ventricular Rate:  80 ?PR Interval:  179 ?QRS Duration: 74 ?QT Interval:  366 ?QTC Calculation: 423 ?R Axis:   16 ?Text Interpretation: Sinus rhythm Borderline ST elevation, anterior leads U waves present Confirmed by Lillyth Spong,  Manoj Enriquez [phone]) on 02/03/2022 10:41:50 PM ?  ? ?  ? ?Laboratory Studies: ?Results for orders placed or performed during the hospital encounter of 02/03/22 (from the past 24 hour(s))  ?CBC with Differential     Status: None  ? Collection Time: 02/03/22  8:06 PM  ?Result Value Ref Range  ? WBC 5.8 4.0 - 10.5 K/uL  ? RBC 4.66 4.22 - 5.81 MIL/uL  ? Hemoglobin 13.4 13.0 - 17.0 g/dL  ? HCT 41.1 39.0 - 52.0 %  ? MCV 88.2 80.0 - 100.0 fL  ? MCH 28.8 26.0 - 34.0 pg  ? MCHC 32.6 30.0 - 36.0 g/dL  ? RDW 13.1 11.5 - 15.5 %  ? Platelets 232 150 - 400 K/uL  ? nRBC 0.0 0.0 - 0.2 %  ? Neutrophils Relative % 31 %  ? Neutro Abs 1.8 1.7 - 7.7 K/uL  ? Lymphocytes Relative 62 %  ?  Lymphs Abs 3.5 0.7 - 4.0 K/uL  ? Monocytes Relative 6 %  ? Monocytes Absolute 0.4 0.1 - 1.0 K/uL  ? Eosinophils Relative 1 %  ? Eosinophils Absolute 0.1 0.0 - 0.5 K/uL  ? Basophils Relative 0 %  ? Basophils Absolute 0.0 0.0 - 0.1 K/uL  ? Immature Granulocytes 0 %  ? Abs Immature Granulocytes 0.01 0.00 - 0.07 K/uL  ?Comprehensive metabolic panel     Status: Abnormal  ? Collection Time: 02/03/22  8:06 PM  ?Result Value Ref Range  ? Sodium 138 135 - 145 mmol/L  ? Potassium 3.2 (L) 3.5 - 5.1 mmol/L  ? Chloride 106 98 - 111 mmol/L  ? CO2 27 22 - 32 mmol/L  ? Glucose, Bld 134 (H) 70 - 99 mg/dL  ? BUN 10 6 - 20 mg/dL  ? Creatinine, Ser 0.80 0.61 - 1.24 mg/dL  ? Calcium 8.8 (L) 8.9 - 10.3 mg/dL  ? Total Protein 6.8 6.5 - 8.1 g/dL  ? Albumin 3.9 3.5 - 5.0 g/dL  ? AST 33 15 - 41 U/L  ? ALT 41 0 - 44 U/L  ? Alkaline Phosphatase 112 38 - 126 U/L  ? Total Bilirubin 0.7 0.3 - 1.2 mg/dL  ? GFR, Estimated >60 >60 mL/min  ? Anion gap 5 5 - 15  ?Troponin I (High Sensitivity)     Status: None  ? Collection Time: 02/04/22 12:30 AM  ?Result Value Ref Range  ? Troponin I (High Sensitivity) 3 <18 ng/L  ?TSH     Status: None  ? Collection Time: 02/04/22 12:40 AM  ?Result Value Ref Range  ? TSH 3.805 0.350 - 4.500 uIU/mL  ?Salicylate level     Status: Abnormal  ? Collection Time: 02/04/22  12:40 AM  ?Result Value Ref Range  ? Salicylate Lvl <0.9 (L) 7.0 - 30.0 mg/dL  ? ?Imaging Studies: ?DG Neck Soft Tissue ? ?Result Date: 02/04/2022 ?CLINICAL DATA:  Foreign body sensation, known thyroid nodule. E

## 2022-02-04 ENCOUNTER — Encounter (HOSPITAL_COMMUNITY): Payer: Self-pay

## 2022-02-04 ENCOUNTER — Emergency Department (HOSPITAL_COMMUNITY): Payer: Managed Care, Other (non HMO)

## 2022-02-04 LAB — TSH: TSH: 3.805 u[IU]/mL (ref 0.350–4.500)

## 2022-02-04 LAB — TROPONIN I (HIGH SENSITIVITY): Troponin I (High Sensitivity): 3 ng/L (ref <18)

## 2022-02-04 LAB — SALICYLATE LEVEL: Salicylate Lvl: <7 mg/dL — ABNORMAL LOW (ref 7.0–30.0)

## 2022-02-04 MED ORDER — SODIUM CHLORIDE (PF) 0.9 % IJ SOLN
INTRAMUSCULAR | Status: AC
  Filled 2022-02-04: qty 50

## 2022-02-04 MED ORDER — HYDROGEN PEROXIDE 3 % EX SOLN
CUTANEOUS | Status: AC
  Administered 2022-02-04: 1
  Filled 2022-02-04: qty 473

## 2022-02-04 MED ORDER — IOHEXOL 300 MG/ML  SOLN
75.0000 mL | Freq: Once | INTRAMUSCULAR | Status: AC | PRN
  Administered 2022-02-04: 75 mL via INTRAVENOUS

## 2022-02-10 ENCOUNTER — Ambulatory Visit: Payer: Managed Care, Other (non HMO) | Admitting: Emergency Medicine

## 2022-02-10 ENCOUNTER — Ambulatory Visit: Payer: Managed Care, Other (non HMO) | Admitting: Endocrinology

## 2022-02-27 NOTE — Progress Notes (Deleted)
Cardiology Office Note:    Date:  02/27/2022   ID:  [name], DOB Mar 27, 1961, MRN [id number]  PCP:  Horald Pollen, MD   [place] Providers Cardiologist:  None { Click to update primary MD,subspecialty MD or APP then REFRESH:1}    Referring MD: Ailene Ards, NP   No chief complaint on file. ***  History of Present Illness:    [name] is a 61 y.o. male with a hx of ***  Past Medical History:  Diagnosis Date   Anxiety    ARM PAIN, LEFT 11/20/2009   Arthritis    BIPOLAR DISORDER UNSPECIFIED 12/13/2007   patient denies   Blood transfusion without reported diagnosis    CHEST PAIN 03/23/2009   currently having    DEPRESSION 12/13/2007   FATIGUE 03/23/2009   GERD (gastroesophageal reflux disease)    GLAUCOMA 09/03/2009   Headache    HEMORRHOIDS 09/03/2009   HOARSENESS 09/03/2009   HYPERCHOLESTEROLEMIA 03/11/2010   MYALGIA 08/13/2009   NECK PAIN 01/18/2009   Palpitations 03/23/2009   Polydipsia 01/24/2008   Shortness of breath 03/23/2009   THYROID NODULE 08/28/2008   benign on Bx   THYROIDITIS 12/13/2007   Tubular adenoma of colon     Past Surgical History:  Procedure Laterality Date   BACK SURGERY     x 3, lumbar   EYE SURGERY     2008   FINGER SURGERY     TRANSANAL HEMORRHOIDAL DEARTERIALIZATION N/A 03/07/2016   Procedure: TRANSANAL HEMORRHOIDAL DEARTERIALIZATION HEMORRHOIDAL LIGATION/PEXY EXAM UNDER ANESTHESIA WITH  POSSIBLE HEMORRHOIDECTOMY ;  Surgeon: Michael Boston, MD;  Location: [facility];  Service: General;  Laterality: N/A;    Current Medications: No outpatient medications have been marked as taking for the 03/03/22 encounter (Appointment) with Freada Bergeron, MD.     Allergies:   Patient has no known allergies.   Social History   Socioeconomic History   Marital status: Divorced    Spouse name: Not on file   Number of children: 1   Years of education: Not on file   Highest education level: Not on file  Occupational History    Occupation: Museum/gallery conservator at [employer] Use   Smoking status: Never   Smokeless tobacco: Never  Vaping Use   Vaping Use: Never used  Substance and Sexual Activity   Alcohol use: No    Alcohol/week: 0.0 standard drinks   Drug use: No   Sexual activity: Not on file  Other Topics Concern   Not on file  Social History Narrative   Mr Hylton lives with his wife in [place], 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       He is originally form [place]   Social Determinants of Sales executive: Not on [employer] Insecurity: Not on file  Transportation Needs: Not on file  Physical Activity: Not on file  Stress: Not on file  Social Connections: Not on file     Family History: The patient's ***family history includes Cancer (age of onset: 70) in his father; Colon cancer in his father; Dementia in his mother; Hypertension in an other family member.  ROS:   Please see the history of present illness.    *** All other systems reviewed and are negative.  EKGs/Labs/Other Studies Reviewed:    The following studies were reviewed today: ***  EKG:  EKG is *** ordered today.  The ekg ordered today demonstrates ***  Recent Labs: 02/03/2022:  ALT 41; BUN 10; Creatinine, Ser 0.80; Hemoglobin 13.4; Platelets 232; Potassium 3.2; Sodium 138 02/04/2022: TSH 3.805  Recent Lipid Panel    Component Value Date/Time   CHOL 146 08/19/2021 1359   CHOL 182 08/03/2020 1152   TRIG 135.0 08/19/2021 1359   HDL 71.50 08/19/2021 1359   HDL 82 08/03/2020 1152   CHOLHDL 2 08/19/2021 1359   VLDL 27.0 08/19/2021 1359   LDLCALC 47 08/19/2021 1359   LDLCALC 73 08/03/2020 1152   LDLDIRECT 66.0 06/15/2017 1708     Risk Assessment/Calculations:   {Does this patient have ATRIAL FIBRILLATION?[phone]}       Physical Exam:    VS:  There were no vitals taken for this visit.    Wt Readings from Last 3 Encounters:  02/03/22 161 lb (73 kg)  01/30/22 161 lb 6 oz  (73.2 kg)  11/04/21 167 lb 6.4 oz (75.9 kg)     GEN: *** Well nourished, well developed in no acute distress HEENT: Normal NECK: No JVD; No carotid bruits LYMPHATICS: No lymphadenopathy CARDIAC: ***RRR, no murmurs, rubs, gallops RESPIRATORY:  Clear to auscultation without rales, wheezing or rhonchi  ABDOMEN: Soft, non-tender, non-distended MUSCULOSKELETAL:  No edema; No deformity  SKIN: Warm and dry NEUROLOGIC:  Alert and oriented x 3 PSYCHIATRIC:  Normal affect   ASSESSMENT:    No diagnosis found. PLAN:    In order of problems listed above:  ***      {Are you ordering a CV Procedure (e.g. stress test, cath, DCCV, TEE, etc)?   Press F2        :[id number]}    Medication Adjustments/Labs and Tests Ordered: Current medicines are reviewed at length with the patient today.  Concerns regarding medicines are outlined above.  No orders of the defined types were placed in this encounter.  No orders of the defined types were placed in this encounter.   There are no Patient Instructions on file for this visit.   Signed, Freada Bergeron, MD  02/27/2022 8:27 PM    [facility]

## 2022-03-03 ENCOUNTER — Ambulatory Visit: Payer: Managed Care, Other (non HMO) | Admitting: Cardiology

## 2022-03-07 ENCOUNTER — Encounter: Payer: Self-pay | Admitting: *Deleted

## 2022-03-10 ENCOUNTER — Encounter: Payer: Self-pay | Admitting: Internal Medicine

## 2022-03-10 ENCOUNTER — Ambulatory Visit: Payer: Managed Care, Other (non HMO) | Admitting: Internal Medicine

## 2022-03-10 VITALS — BP 122/78 | HR 75 | Ht 65.0 in | Wt 157.4 lb

## 2022-03-10 DIAGNOSIS — K5904 Chronic idiopathic constipation: Secondary | ICD-10-CM | POA: Diagnosis not present

## 2022-03-10 DIAGNOSIS — K219 Gastro-esophageal reflux disease without esophagitis: Secondary | ICD-10-CM

## 2022-03-10 DIAGNOSIS — R140 Abdominal distension (gaseous): Secondary | ICD-10-CM

## 2022-03-10 DIAGNOSIS — K2100 Gastro-esophageal reflux disease with esophagitis, without bleeding: Secondary | ICD-10-CM | POA: Diagnosis not present

## 2022-03-10 DIAGNOSIS — Z86010 Personal history of colonic polyps: Secondary | ICD-10-CM

## 2022-03-10 MED ORDER — OMEPRAZOLE 40 MG PO CPDR: 40.0000 mg | DELAYED_RELEASE_CAPSULE | Freq: Every day | ORAL | 1 refills | Status: DC

## 2022-03-10 MED ORDER — TRULANCE 3 MG PO TABS: 1.0000 | ORAL_TABLET | Freq: Every day | ORAL | 1 refills | Status: DC

## 2022-03-10 NOTE — Patient Instructions (Signed)
We have sent the following medications to your pharmacy for you to pick up at your convenience: ?Trulance 3 mg daily ?Omeprazole 40 mg daily ? ?If you are age 59 or older, your body mass index should be between 23-30. Your Body mass index is 26.19 kg/m?Marland Kitchen If this is out of the aforementioned range listed, please consider follow up with your Primary Care Provider. ? ?If you are age 38 or younger, your body mass index should be between 19-25. Your Body mass index is 26.19 kg/m?Marland Kitchen If this is out of the aformentioned range listed, please consider follow up with your Primary Care Provider.  ? ?________________________________________________________ ? ?The [facility] providers would like to encourage you to use [facility] to communicate with providers for non-urgent requests or questions.  Due to long hold times on the telephone, sending your provider a message by [facility] may be a faster and more efficient way to get a response.  Please allow 48 business hours for a response.  Please remember that this is for non-urgent requests.  ?_______________________________________________________ ? ?Due to recent changes in healthcare laws, you may see the results of your imaging and laboratory studies on MyChart before your provider has had a chance to review them.  We understand that in some cases there may be results that are confusing or concerning to you. Not all laboratory results come back in the same time frame and the provider may be waiting for multiple results in order to interpret others.  Please give [place] 48 hours in order for your provider to thoroughly review all the results before contacting the office for clarification of your results.  ? ?

## 2022-03-10 NOTE — Addendum Note (Signed)
Addended by: Larina Bras on: 03/10/2022 09:58 AM ? ? Modules accepted: Orders ? ?

## 2022-03-10 NOTE — Progress Notes (Signed)
? ?  Subjective:  ? ? Patient ID: [name], male    DOB: 1961/02/27, 61 y.o.   MRN: [id number] ? ?HPI ?[name] is a 61 year old male with a history of chronic idiopathic constipation, adenomatous and sessile serrated colon polyps, prior hemorrhoidectomy, GERD who is here for follow-up.  He was last seen in the office in October 2021 and for upper endoscopy and colonoscopy in December 2021. ? ?See reports for details of EGD and colonoscopy. ? ?Today he reports he is doing well but he has been rationing his Trulance because he got a letter from his insurance company that it was no longer covered.  He provides paperwork where it appears that MiraLAX, lactulose and Linzess are preferred.  He has tried all of these in the past without success or with incomplete success.  Now he is only been using Trulance once per week and without it he is having abdominal bloating, constipation and hard stool.  GERD is controlled on omeprazole.  No dysphagia.  No blood in stool or melena. ? ? ?Review of Systems ?As per HPI, otherwise negative ? ?Current Medications, Allergies, Past Medical History, Past Surgical History, Family History and Social History were reviewed in [employer] record. ? ?   ?Objective:  ? Physical Exam ?BP 122/78   Pulse 75   Ht '5\' 5"'$  (1.651 m)   Wt 157 lb 6.4 oz (71.4 kg)   SpO2 98%   BMI 26.19 kg/m?  ?Gen: awake, alert, NAD ?HEENT: anicteric, op clear ?CV: RRR, no mrg ?Pulm: CTA b/l ?Abd: soft, NT/ND, +BS throughout ?Ext: no c/c/e ?Neuro: nonfocal ? ?   ?Assessment & Plan:  ?61 year old male with a history of chronic idiopathic constipation, adenomatous and sessile serrated colon polyps, prior hemorrhoidectomy, GERD who is here for follow-up.  ? ?Chronic idiopathic constipation --previous nonresponse or ineffective/incomplete response to Linzess, Amitiza, lactulose and MiraLAX.  Trulance is working very well.  Hopefully with prior authorization we can continue  Trulance. ?--Trulance 3 mg once daily; #90 with 3 refills ?--Co-pay card provided today given his commercial insurance ? ?2.  History of adenomatous and sessile serrated polyps --surveillance colonoscopy recommended around December 2024 ? ?3.  GERD --mild esophagitis by biopsy without Barrett's esophagus.  Omeprazole is working well for him ?--Continue omeprazole 40 mg once daily ? ?20 minutes total spent today including patient facing time, coordination of care, reviewing medical history/procedures/pertinent radiology studies, and documentation of the encounter. ? ? ?

## 2022-03-14 ENCOUNTER — Ambulatory Visit: Payer: Managed Care, Other (non HMO) | Admitting: Nurse Practitioner

## 2022-03-18 NOTE — Progress Notes (Unsigned)
Patient ID: [name], male   DOB: 03/02/1961, 61 y.o.   MRN: [id number]  HPI  [name] is a 61 y.o.-year-old male, returning for follow-up for a right thyroid nodule.  He previously saw Dr. Loanne Ingram, last visit 4 months ago.  Patient has a history of stable thyroid nodules, initially diagnosed in 2008.  On 02/04/2022, he presented to the emergency room with tinnitus.  An x-ray and a CT scan of the temporal area were normal.  A TSH was also normal.  Reviewed previous investigation: Thyroid U/S (05/05/2007): The right lobe is 5.2 x 1.6 x 2.0 cm and the left lobe is 5.3 x 1.2 x 2.1 cm.  The isthmus is 4 mm in thickness.  There is a 1.1 x 0.7 x 0.8 cm solid nodule in the lower pole of the right lobe medially.  Otherwise, no nodules are seen.    IMPRESSION:  Solitary 1.1 cm nodule in the right lobe. Biopsy can be performed as clinically indicated.     Thyroid FNA (05/18/2007):  THYROID, RIGHT, FINE NEEDLE ASPIRATION (SMEARS, THIN PREP, CELL   BLOCK): CONSISTENT WITH HYPERPLASTIC NODULE, SEE COMMENT.    COMMENT   The specimen shows abundant colloid and a few groups of   follicular epithelial cells. Overall, the cytologic features   favor a benign process. ([name]:mw 05/19/07)   ... Thyroid U/S (04/14/2011): Right thyroid lobe:  Stable 6.3 cm long X 1.8 cm AP X 2.2 cm wide.  Left thyroid lobe:  Stable 5.6 cm long X 1.9 cm AP X 2.3 cm wide.  Isthmus:  Stable 5 mm AP thickness.   Focal nodules:  Stable homogeneous thyroid echotexture.  Stable dominant right inferior thyroid isoechoic solid thyroid  nodule measuring 1.6 cm long X 0.9 cm AP X 1.5 cm wide, 5 mm left isthmus and inferior left lobe is 6 mm solid nodules.   No new nodules.   Lymphadenopathy:  None visualized.   IMPRESSION:  Stable thyroid nodules with no new significant abnormality.   ... Thyroid U/S (03/15/2018): Parenchymal Echotexture: Normal  Isthmus: 0.3 cm  Right lobe: 5.0 x 1.8 x 2.5 cm  Left lobe: 4.9 x 1.8 x  2.0 cm _________________________________________________________   Questionable pseudo nodule in the right inferior gland is isoechoic to the surrounding thyroid tissue. This region measures 1.4 x 0.8 x 1.6 cm which is similar to slightly smaller than 1.6 x 0.9 x 1.5 cm as measured in June of 2012. Greater than 5 year stability is consistent with benignity.   IMPRESSION: Confirmed greater than 5 year stability of the nodule versus pseudo nodule in the right inferior gland consistent with a benign process. No further imaging required.     Pt denies: - feeling nodules in neck - hoarseness - dysphagia - choking - SOB with lying down  I reviewed pt's thyroid tests: Lab Results  Component Value Date   TSH 3.805 02/04/2022   TSH 2.07 01/30/2022   TSH 1.25 11/04/2021   TSH 1.270 08/03/2020   TSH 1.930 07/18/2019   TSH 1.14 07/12/2018   TSH 2.16 06/15/2017   TSH 2.48 05/19/2016   TSH 2.84 12/03/2015   TSH 4.08 04/25/2015   FREET4 0.8 12/13/2007    He was previously found to have thyroiditis changes on the thyroid uptake and scan (03/01/2003): 24 HOUR UPTAKE WAS PERFORMED AFTER THE PATIENT WAS GIVEN A CAPSULE CONTAINING 14.3 uCi I131.   THE 24 HOUR UPTAKE IS CALCULATED TO BE 0.2%, WITH A SECOND CALCULATION OF 0.6%.  THYROID SCANNING WAS PERFORMED AFTER THE IV INJECTION OF 11.2 mCi 4mc04.  THERE IS MINIMAL UPTAKE IN THE GLAND.   Pt denies: - fatigue - heat intolerance/cold intolerance - tremors - palpitations - anxiety/depression - hyperdefecation/constipation - weight loss/weight gain - dry skin - hair loss  No FH of thyroid ds. No FH of thyroid cancer. No h/o radiation tx to head or neck. No steroid use. No herbal supplements. No Biotin supplements or Hair, Skin and Nails vitamins.  Pt also has a history of ***.  ROS: + See HPI   Past Medical History:  Diagnosis Date   Anxiety    ARM PAIN, LEFT 11/20/2009   Arthritis    BIPOLAR DISORDER UNSPECIFIED  12/13/2007   patient denies   Blood transfusion without reported diagnosis    CHEST PAIN 03/23/2009   currently having    DEPRESSION 12/13/2007   FATIGUE 03/23/2009   GERD (gastroesophageal reflux disease)    GLAUCOMA 09/03/2009   Headache    HEMORRHOIDS 09/03/2009   HOARSENESS 09/03/2009   HYPERCHOLESTEROLEMIA 03/11/2010   MYALGIA 08/13/2009   NECK PAIN 01/18/2009   Palpitations 03/23/2009   Polydipsia 01/24/2008   Shortness of breath 03/23/2009   THYROID NODULE 08/28/2008   benign on Bx   THYROIDITIS 12/13/2007   Tubular adenoma of colon    Tubular adenoma of colon    Past Surgical History:  Procedure Laterality Date   BACK SURGERY     x 3, lumbar   EYE SURGERY     2008   FINGER SURGERY     TRANSANAL HEMORRHOIDAL DEARTERIALIZATION N/A 03/07/2016   Procedure: TRANSANAL HEMORRHOIDAL DEARTERIALIZATION HEMORRHOIDAL LIGATION/PEXY EXAM UNDER ANESTHESIA WITH  POSSIBLE HEMORRHOIDECTOMY ;  Surgeon: SMichael Boston MD;  Location: [facility];  Service: General;  Laterality: N/A;   Social History   Socioeconomic History   Marital status: Divorced    Spouse name: Not on file   Number of children: 1   Years of education: Not on file   Highest education level: Not on file  Occupational History   Occupation: BMuseum/gallery conservatorat [employer]   Smoking status: Never   Smokeless tobacco: Never  Vaping Use   Vaping Use: Never used  Substance and Sexual Activity   Alcohol use: No    Alcohol/week: 0.0 standard drinks   Drug use: No   Sexual activity: Not on file  Other Topics Concern   Not on file  Social History Narrative   [name] works as a eGlass blower/designerin a gSales executive       He is originally form [place]  Social Determinants of HSales executive Not on f[employer]castInsecurity: Not on file  Transportation Needs: Not on file  Physical Activity: Not on file  Stress: Not on file  Social Connections:  Not on file  Intimate Partner Violence: Not on file   Current Outpatient Medications on File Prior to Visit  Medication Sig Dispense Refill   atorvastatin (LIPITOR) 20 MG tablet Take 1 tablet (20 mg total) by mouth daily. 90 tablet 2   clindamycin-benzoyl peroxide (BENZACLIN) gel Apply topically 2 (two) times daily. 25 g 0   diclofenac Sodium (VOLTAREN) 1 % GEL Apply 2 g topically 4 (four) times daily. 100 g 5   escitalopram (LEXAPRO) 10 MG tablet Take 10 mg by mouth daily.     fluticasone (FLONASE) 50 MCG/ACT nasal spray Place 1 spray into both  nostrils 2 (two) times daily. 16 g 6   losartan-hydrochlorothiazide (HYZAAR) 50-12.5 MG tablet Take 0.5 tablets by mouth daily. 45 tablet 0   methocarbamol (ROBAXIN) 500 MG tablet TAKE ONE TABLET BY MOUTH EVERY MORNING AND AFTERNOON. TAKE TWO TABLETS BY MOUTH EVERY NIGHT AT BEDTIME AS NEEDED FOR MUSCLE RELAXATION 40 tablet 0   mirtazapine (REMERON) 45 MG tablet      Multiple Vitamin (MULTIVITAMIN) tablet Take 1 tablet by mouth daily.     omeprazole (PRILOSEC) 40 MG capsule Take 1 capsule (40 mg total) by mouth daily. 90 capsule 1   Plecanatide (TRULANCE) 3 MG TABS Take 1 tablet by mouth daily. 90 tablet 1   sildenafil (VIAGRA) 100 MG tablet Take 0.5-1 tablets (50-100 mg total) by mouth daily as needed for erectile dysfunction. 30 tablet 3   Travoprost (TRAVATAN OP) Apply to eye at bedtime.     Travoprost, BAK Free, (TRAVATAN) 0.004 % SOLN ophthalmic solution      No current facility-administered medications on file prior to visit.   No Known Allergies Family History  Problem Relation Age of Onset   Cancer Father 49       colon   Colon cancer Father    Dementia Mother    Hypertension Other    PE: There were no vitals taken for this visit. Wt Readings from Last 3 Encounters:  03/10/22 157 lb 6.4 oz (71.4 kg)  02/03/22 161 lb (73 kg)  01/30/22 161 lb 6 oz (73.2 kg)    Constitutional: overweight, in NAD Eyes: PERRLA, EOMI, no  exophthalmos ENT: moist mucous membranes, no thyromegaly, no cervical lymphadenopathy Cardiovascular: RRR, No MRG Respiratory: CTA B Musculoskeletal: no deformities, strength intact in all 4;  Skin: moist, warm, no rashes Neurological: no tremor with outstretched hands, DTR normal in all 4  ASSESSMENT: 1. R Thyroid nodule  PLAN: 1. Thyroid nodule - I reviewed the images of his thyroid ultrasound along with the patient.  His thyroid nodule is not very large, at 1.4 cm on the latest ultrasound, and it does not have concerning features: - hypoechogenicity - presence of microcalcifications - presence of internal blood flow - taller-than-wide distribution - irregular contours Pt does not have a thyroid cancer family history or a personal history of RxTx to head/neck. All these would favor benignity.  - This nodule appears to be approximately stable over many years, with investigations started in 2008.  In the 7 years between 2012 and 2019, the nodule appeared to slightly have decreased in size.  On the last ultrasound from 2019, this nodule appeared similar to a pseudonodule, or inflammatory nodule, therefore benign - Moreover, this nodule was biopsied in the past with benign results - we discussed at this visit that this nodule is most likely benign and no follow-up is needed  - at last visit with Dr. Loanne Ingram, he complained of a foreign body sensation in neck which would be unlikely to be related to the nodule since the nodule is small and does not appear to be pressing on any structure.  The nodule is found in the right thyroid and on the left, to be more likely to compress his esophagus.  Also, during his latest ED visit, he had an x-ray of his neck without any pathology found. - a recent TSH was normal -We discussed that this visit that he may have Hashimoto's thyroiditis, which is autoimmune inflammation, which can cause fluctuations in the thyroid size and, therefore, intermittent pressure  in neck -At today's  visit we can check him for this, by checking thyroid antibodies -For now, I will not schedule a follow-up for him.  He can continue to follow with PCP with annual TSH -No further investigation is needed for his thyroid nodule  Philemon Kingdom, MD PhD [facility]

## 2022-03-19 ENCOUNTER — Encounter: Payer: Self-pay | Admitting: Internal Medicine

## 2022-03-19 ENCOUNTER — Ambulatory Visit: Payer: Managed Care, Other (non HMO) | Admitting: Internal Medicine

## 2022-03-19 VITALS — BP 130/84 | HR 74 | Ht 65.0 in | Wt 157.2 lb

## 2022-03-19 DIAGNOSIS — R7303 Prediabetes: Secondary | ICD-10-CM | POA: Diagnosis not present

## 2022-03-19 DIAGNOSIS — E041 Nontoxic single thyroid nodule: Secondary | ICD-10-CM

## 2022-03-19 NOTE — Patient Instructions (Addendum)
Please return to see me as needed. °

## 2022-03-24 ENCOUNTER — Other Ambulatory Visit: Payer: Self-pay | Admitting: Emergency Medicine

## 2022-03-31 ENCOUNTER — Telehealth: Payer: Self-pay | Admitting: Internal Medicine

## 2022-03-31 NOTE — Telephone Encounter (Signed)
[facility] pharmacy. They have the script for Trulance. It is "covered" but the copay is  $1,000. The pharmacist ran the copay saving card we gave him which brought the copay down to $900.  Please advise

## 2022-04-01 NOTE — Telephone Encounter (Signed)
Jan,  Can you please send this to our new team working on prior-authorizations to see if they may have more success He has tried and failed Linzess, lactulose and Miralax He has Pharmacist, community In the interim please provide samples to the patient as Trulance works well for him.  I have included Magda Paganini who may be able to help with samples to allow time for prior-auth work Thanks all [employer]

## 2022-04-02 ENCOUNTER — Telehealth: Payer: Self-pay | Admitting: Pharmacy Technician

## 2022-04-02 ENCOUNTER — Other Ambulatory Visit (HOSPITAL_COMMUNITY): Payer: Self-pay

## 2022-04-02 NOTE — Telephone Encounter (Signed)
Received notification from [place] regarding a prior authorization for MOTEGRITY '2MG'$ . Authorization has been APPROVED from 6.7.23 to 6.7.24.   Per test claim, copay for 30 days supply is $100   Authorization # [id number]

## 2022-04-02 NOTE — Telephone Encounter (Signed)
The Trulance rep will bring samples by the office on Friday

## 2022-04-02 NOTE — Telephone Encounter (Signed)
Patient Advocate Encounter  Received notification from [facility] that prior authorization for MOTEGRITY '2MG'$  is required.   PA submitted on 6.7.23 Key [id number] Status is pending   [facility] will continue to follow  Luciano Cutter, CPhT Patient Advocate Phone: [phone]

## 2022-04-02 NOTE — Progress Notes (Deleted)
Cardiology Office Note:    Date:  04/02/2022   ID:  [name], DOB 01-22-1961, MRN [id number]  PCP:  Horald Pollen, MD   [facility] Providers Cardiologist:  None { Click to update primary MD,subspecialty MD or APP then REFRESH:1}    Referring MD: Ailene Ards, NP   No chief complaint on file. ***  History of Present Illness:    [name] is a 61 y.o. male with a hx of ***  Past Medical History:  Diagnosis Date   Anxiety    ARM PAIN, LEFT 11/20/2009   Arthritis    BIPOLAR DISORDER UNSPECIFIED 12/13/2007   patient denies   Blood transfusion without reported diagnosis    CHEST PAIN 03/23/2009   currently having    DEPRESSION 12/13/2007   FATIGUE 03/23/2009   GERD (gastroesophageal reflux disease)    GLAUCOMA 09/03/2009   Headache    HEMORRHOIDS 09/03/2009   HOARSENESS 09/03/2009   HYPERCHOLESTEROLEMIA 03/11/2010   MYALGIA 08/13/2009   NECK PAIN 01/18/2009   Palpitations 03/23/2009   Polydipsia 01/24/2008   Shortness of breath 03/23/2009   THYROID NODULE 08/28/2008   benign on Bx   THYROIDITIS 12/13/2007   Tubular adenoma of colon    Tubular adenoma of colon     Past Surgical History:  Procedure Laterality Date   BACK SURGERY     x 3, lumbar   EYE SURGERY     2008   FINGER SURGERY     TRANSANAL HEMORRHOIDAL DEARTERIALIZATION N/A 03/07/2016   Procedure: TRANSANAL HEMORRHOIDAL DEARTERIALIZATION HEMORRHOIDAL LIGATION/PEXY EXAM UNDER ANESTHESIA WITH  POSSIBLE HEMORRHOIDECTOMY ;  Surgeon: Michael Boston, MD;  Location: [facility];  Service: General;  Laterality: N/A;    Current Medications: No outpatient medications have been marked as taking for the 04/07/22 encounter (Appointment) with Freada Bergeron, MD.     Allergies:   Patient has no known allergies.   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       He is originally form [place]   Social Determinants of Sales executive: Not on [employer] Insecurity: Not on file  Transportation Needs: Not on file  Physical Activity: Not on file  Stress: Not on file  Social Connections: Not on file     Family History: The patient's ***family history includes Cancer (age of onset: 69) in his father; Colon cancer in his father; Dementia in his mother; Hypertension in an other family member.  ROS:   Please see the history of present illness.    *** All other systems reviewed and are negative.  EKGs/Labs/Other Studies Reviewed:    The following studies were reviewed today: ***  EKG:  EKG is *** ordered today.  The ekg ordered  today demonstrates ***  Recent Labs: 02/03/2022: ALT 41; BUN 10; Creatinine, Ser 0.80; Hemoglobin 13.4; Platelets 232; Potassium 3.2; Sodium 138 02/04/2022: TSH 3.805  Recent Lipid Panel    Component Value Date/Time   CHOL 146 08/19/2021 1359   CHOL 182 08/03/2020 1152   TRIG 135.0 08/19/2021 1359   HDL 71.50 08/19/2021 1359   HDL 82 08/03/2020 1152   CHOLHDL 2 08/19/2021 1359   VLDL 27.0 08/19/2021 1359   LDLCALC 47 08/19/2021 1359   LDLCALC 73 08/03/2020 1152   LDLDIRECT 66.0 06/15/2017 1708     Risk Assessment/Calculations:   {Does this patient have ATRIAL FIBRILLATION?[phone]}       Physical Exam:    VS:  There were no vitals taken for this visit.    Wt Readings from Last 3 Encounters:  03/19/22  157 lb 3.2 oz (71.3 kg)  03/10/22 157 lb 6.4 oz (71.4 kg)  02/03/22 161 lb (73 kg)     GEN: *** Well nourished, well developed in no acute distress HEENT: Normal NECK: No JVD; No carotid bruits LYMPHATICS: No lymphadenopathy CARDIAC: ***RRR, no murmurs, rubs, gallops RESPIRATORY:  Clear to auscultation without rales, wheezing or rhonchi  ABDOMEN: Soft, non-tender, non-distended MUSCULOSKELETAL:  No edema; No deformity  SKIN: Warm and dry NEUROLOGIC:  Alert and oriented x 3 PSYCHIATRIC:  Normal affect   ASSESSMENT:    No diagnosis found. PLAN:    In order of problems listed above:  ***      {Are you ordering a CV Procedure (e.g. stress test, cath, DCCV, TEE, etc)?   Press F2        :[id number]}    Medication Adjustments/Labs and Tests Ordered: Current medicines are reviewed at length with the patient today.  Concerns regarding medicines are outlined above.  No orders of the defined types were placed in this encounter.  No orders of the defined types were placed in this encounter.   There are no Patient Instructions on file for this visit.   Signed, Freada Bergeron, MD  04/02/2022 4:27 PM    [place]

## 2022-04-03 MED ORDER — MOTEGRITY 2 MG PO TABS: 2.0000 mg | ORAL_TABLET | Freq: Every day | ORAL | 1 refills | Status: DC

## 2022-04-03 NOTE — Addendum Note (Signed)
Addended by: Roetta Sessions on: 04/03/2022 10:10 AM   Modules accepted: Orders

## 2022-04-03 NOTE — Telephone Encounter (Signed)
Information about Motegrity communicated by Dr. Hilarie Fredrickson to Tia Alert

## 2022-04-03 NOTE — Telephone Encounter (Signed)
Yes, thanks Please tell patient I would like to try a different agent.  He should let me know either way if it is helpful after 2 weeks or so, sooner if any intolerance or side effects Motegrity 2 mg daily

## 2022-04-03 NOTE — Telephone Encounter (Signed)
New script for Motegrity sent to the pharmacy. Called patient. LM with we sent a new script for Motegrity '2mg'$  to be taken once daily. Asked that he let us know in a few weeks if that is effective

## 2022-04-07 ENCOUNTER — Ambulatory Visit: Payer: Managed Care, Other (non HMO) | Admitting: Cardiology

## 2022-04-18 ENCOUNTER — Ambulatory Visit: Payer: Managed Care, Other (non HMO) | Admitting: Nurse Practitioner

## 2022-04-20 NOTE — Progress Notes (Deleted)
Cardiology Office Note:    Date:  04/20/2022   ID:  [name], DOB [birth year], MRN [id number]  PCP:  Horald Pollen, MD   [facility] Providers Cardiologist:  None { }    Referring MD: Ailene Ards, NP     History of Present Illness:    [name] is a 61 y.o. male with a hx of bipolar disorder, anxiety, and HLD who was referred by [name] for further evaluation of murmur.  Was remotely seen by Dr. Radford Ingram in 2015 for atypical chest pain. ETT at that time normal. TTE 2015 with normal BiV function, no significant valve disease.   Today, ***  Past Medical History:  Diagnosis Date   Anxiety    ARM PAIN, LEFT 11/20/2009   Arthritis    BIPOLAR DISORDER UNSPECIFIED 12/13/2007   patient denies   Blood transfusion without reported diagnosis    CHEST PAIN 03/23/2009   currently having    DEPRESSION 12/13/2007   FATIGUE 03/23/2009   GERD (gastroesophageal reflux disease)    GLAUCOMA 09/03/2009   Headache    HEMORRHOIDS 09/03/2009   HOARSENESS 09/03/2009   HYPERCHOLESTEROLEMIA 03/11/2010   MYALGIA 08/13/2009   NECK PAIN 01/18/2009   Palpitations 03/23/2009   Polydipsia 01/24/2008   Shortness of breath 03/23/2009   THYROID NODULE 08/28/2008   benign on Bx   THYROIDITIS 12/13/2007   Tubular adenoma of colon    Tubular adenoma of colon     Past Surgical History:  Procedure Laterality Date   BACK SURGERY     x 3, lumbar   EYE SURGERY     2008   FINGER SURGERY     TRANSANAL HEMORRHOIDAL DEARTERIALIZATION N/A 03/07/2016   Procedure: TRANSANAL HEMORRHOIDAL DEARTERIALIZATION HEMORRHOIDAL LIGATION/PEXY EXAM UNDER ANESTHESIA WITH  POSSIBLE HEMORRHOIDECTOMY ;  Surgeon: Stephen Boston, MD;  Location: [facility];  Service: General;  Laterality: N/A;    Current Medications: No outpatient medications have been marked as taking for the 05/02/22 encounter (Appointment) with Stephen Bergeron, MD.     Allergies:   Patient has no known allergies.   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       He is originally form [place]   Social Determinants of Sales executive: Not on [employer] Insecurity: Not on file  Transportation Needs: Not on file  Physical Activity: Not on file  Stress: Not on file  Social Connections: Not on file     Family History: The patient's ***family history includes Cancer (age of onset: 46) in his father; Colon cancer in his father; Dementia in his mother; Hypertension in an other family member.  ROS:   Please see the history of present illness.    ***  All other systems reviewed and are negative.  EKGs/Labs/Other Studies Reviewed:    The following studies were reviewed today: TTE 2014-03-31: Study Conclusions   - Left ventricle: The cavity size was normal. Systolic function was    normal. Wall motion was normal; there were no regional wall    motion abnormalities.  - Atrial septum: No defect or patent foramen ovale was identified.  - Pericardium, extracardiac: A trivial pericardial effusion was    identified posterior to the heart.   EKG:  EKG is *** ordered today.  The ekg ordered today demonstrates ***  Recent Labs: 02/03/2022: ALT 41; BUN 10; Creatinine, Ser 0.80; Hemoglobin 13.4; Platelets 232; Potassium 3.2; Sodium 138 02/04/2022: TSH 3.805  Recent Lipid Panel     Component Value Date/Time   CHOL 146 08/19/2021 1359   CHOL 182 08/03/2020 1152   TRIG 135.0 08/19/2021 1359   HDL 71.50 08/19/2021 1359   HDL 82 08/03/2020 1152   CHOLHDL 2 08/19/2021 1359   VLDL 27.0 08/19/2021 1359   LDLCALC 47 08/19/2021 1359   LDLCALC 73 08/03/2020 1152   LDLDIRECT 66.0 06/15/2017 1708     Risk Assessment/Calculations:   {Does this patient have ATRIAL FIBRILLATION?:[phone]}       Physical Exam:    VS:  There were no vitals taken for this visit.    Wt Readings from Last 3 Encounters:  03/19/22 157 lb 3.2 oz (71.3 kg)  2022/03/31 157 lb 6.4 oz (71.4 kg)  02/03/22 161 lb (73 kg)     GEN: *** Well nourished, well developed in no acute distress HEENT: Normal NECK: No JVD; No carotid bruits LYMPHATICS: No lymphadenopathy CARDIAC: ***RRR, no murmurs, rubs, gallops RESPIRATORY:  Clear to auscultation without rales, wheezing or rhonchi  ABDOMEN: Soft, non-tender, non-distended MUSCULOSKELETAL:  No edema; No deformity  SKIN: Warm and dry NEUROLOGIC:  Alert and oriented x 3 PSYCHIATRIC:  Normal affect   ASSESSMENT:    No diagnosis found. PLAN:    In order of problems listed above:  #Systolic Murmur: -Check TTE  #HLD: -Continue lipitor '20mg'$  daily  #HTN: -Continue losartan-HCTZ 50-12.'5mg'$  daily     {Are you ordering a CV Procedure (e.g. stress test, cath, DCCV, TEE, etc)?   Press F2        :[id number]}    Medication Adjustments/Labs and Tests Ordered: Current medicines are reviewed at length with the patient today.  Concerns regarding medicines are outlined above.  No orders of the defined types were placed in this encounter.  No orders of the defined types were placed in this encounter.   There are no Patient Instructions on file for this visit.   Signed, Stephen Bergeron, MD  04/20/2022 4:08 PM    [facility]

## 2022-05-02 ENCOUNTER — Ambulatory Visit: Payer: Managed Care, Other (non HMO) | Admitting: Cardiology

## 2022-05-09 ENCOUNTER — Other Ambulatory Visit: Payer: Self-pay | Admitting: Nurse Practitioner

## 2022-05-09 ENCOUNTER — Ambulatory Visit: Payer: Managed Care, Other (non HMO) | Admitting: Nurse Practitioner

## 2022-05-09 VITALS — BP 130/78 | HR 73 | Temp 97.6°F | Ht 65.0 in | Wt 160.0 lb

## 2022-05-09 DIAGNOSIS — J309 Allergic rhinitis, unspecified: Secondary | ICD-10-CM | POA: Insufficient documentation

## 2022-05-09 DIAGNOSIS — R634 Abnormal weight loss: Secondary | ICD-10-CM | POA: Diagnosis not present

## 2022-05-09 DIAGNOSIS — K649 Unspecified hemorrhoids: Secondary | ICD-10-CM

## 2022-05-09 DIAGNOSIS — G629 Polyneuropathy, unspecified: Secondary | ICD-10-CM | POA: Diagnosis not present

## 2022-05-09 DIAGNOSIS — E876 Hypokalemia: Secondary | ICD-10-CM

## 2022-05-09 DIAGNOSIS — R011 Cardiac murmur, unspecified: Secondary | ICD-10-CM

## 2022-05-09 LAB — BASIC METABOLIC PANEL
BUN: 23 mg/dL (ref 6–23)
CO2: 27 mEq/L (ref 19–32)
Calcium: 9 mg/dL (ref 8.4–10.5)
Chloride: 104 mEq/L (ref 96–112)
Creatinine, Ser: 0.91 mg/dL (ref 0.40–1.50)
GFR: 92.69 mL/min (ref >60.00)
Glucose, Bld: 122 mg/dL — ABNORMAL HIGH (ref 70–99)
Potassium: 3.4 mEq/L — ABNORMAL LOW (ref 3.5–5.1)
Sodium: 138 mEq/L (ref 135–145)

## 2022-05-09 LAB — CBC
HCT: 39.8 % (ref 39.0–52.0)
Hemoglobin: 13.3 g/dL (ref 13.0–17.0)
MCHC: 33.4 g/dL (ref 30.0–36.0)
MCV: 86.9 fl (ref 78.0–100.0)
Platelets: 241 10*3/uL (ref 150.0–400.0)
RBC: 4.58 Mil/uL (ref 4.22–5.81)
RDW: 13.5 % (ref 11.5–15.5)
WBC: 5.5 10*3/uL (ref 4.0–10.5)

## 2022-05-09 LAB — HEMOGLOBIN A1C: Hgb A1c MFr Bld: 6.2 % (ref 4.6–6.5)

## 2022-05-09 LAB — B12 AND FOLATE PANEL
Folate: >24.2 ng/mL (ref >5.9)
Vitamin B-12: 641 pg/mL (ref 211–911)

## 2022-05-09 MED ORDER — IBUPROFEN 600 MG PO TABS: 600.0000 mg | ORAL_TABLET | Freq: Three times a day (TID) | ORAL | 0 refills | Status: DC | PRN

## 2022-05-09 MED ORDER — GABAPENTIN 100 MG PO CAPS: 100.0000 mg | ORAL_CAPSULE | Freq: Two times a day (BID) | ORAL | 2 refills | Status: DC | PRN

## 2022-05-09 MED ORDER — POTASSIUM CHLORIDE CRYS ER 10 MEQ PO TBCR: 10.0000 meq | EXTENDED_RELEASE_TABLET | Freq: Every day | ORAL | 2 refills | Status: DC

## 2022-05-09 NOTE — Assessment & Plan Note (Signed)
We will check blood work for further evaluation today including vitamin H38, folate, metabolic panel.  We will treat with low-dose gabapentin twice a day as needed and refer to neurology for further assistance and evaluation.  Further recommendations may be made based upon lab results.

## 2022-05-09 NOTE — Progress Notes (Signed)
Established Patient Office Visit  Subjective   Patient ID: [name], male    DOB: 29-Jul-1961  Age: 61 y.o. MRN: [id number]  Chief Complaint  Patient presents with   6wk follow up   Cough    With congestion   Burning of the feet    Patient arrives today for acute visit for the above.  Cough: Has been present for over 1 month.  Reports it is worse at night when laying down but will resolve as the day goes on.  Denies any fever or shortness of breath.  Reports he has nasal congestion associated with it.  He reports that this does happen on almost a seasonal basis but has never been diagnosed with seasonal allergies.  He has been taking Flonase nasal spray without much improvement in his symptoms.  Neuropathy: Has burning in bilateral feet.  This has been ongoing for at least 1 year.  He continues to have low back pain, but otherwise is not experiencing any incontinence of urine or bowels.  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  He has a history of prediabetes with last A1c of 6.4.  He would like to discuss possible treatment of his neuropathy and wants to consider being evaluated by neurology.  History of internal hemorrhoid: He reports he had surgery approximately 6 years ago for treatment of internal hemorrhoid.  He still has intermittent itching especially when he sitting down.  He denies seeing significant amounts of blood in the stool but vaguely recalls something red in his stool about 2 weeks ago.  Unintentional weight loss: He was also evaluated by myself about 4 months ago for concerns with unintentional weight loss.  He reports today that his weight has stabilized and he is no longer very concerned about this.  Blood work at that time showed normal kidney function and electrolytes, no anemia, A1c of 6.4, normal TSH.  He is up-to-date with colon cancer screening and prostate cancer screening.    Review of Systems  Constitutional:   Negative for fever.  HENT:  Negative for congestion.   Respiratory:  Positive for cough. Negative for shortness of breath.   Cardiovascular:  Positive for chest pain (wuth coughing alone).  Gastrointestinal:  Positive for abdominal pain (bloating), blood in stool (2 weeks ago) and constipation.  Neurological:  Positive for dizziness (yesterday was working outside in the heat) and headaches (yesterday was working outside in the heat).      Objective:     BP 130/78 (BP Location: Left Arm, Patient Position: Sitting, Cuff Size: Large)   Pulse 73   Temp 97.6 F (36.4 C) (Oral)   Ht '5\' 5"'$  (1.651 m)   Wt 160 lb (72.6 kg)   SpO2 94%   BMI 26.63 kg/m  BP Readings from Last 3 Encounters:  05/09/22 130/78  03/19/22 130/84  03/10/22 122/78   Wt Readings from Last 3 Encounters:  05/09/22 160 lb (72.6 kg)  03/19/22 157 lb 3.2 oz (71.3 kg)  03/10/22 157 lb 6.4 oz (71.4 kg)      Physical Exam Vitals reviewed. Exam conducted with a chaperone present.  Constitutional:      Appearance: Normal appearance.  HENT:     Head: Normocephalic and atraumatic.  Cardiovascular:     Rate and Rhythm: Normal rate and regular rhythm.     Heart sounds: Murmur heard.  Pulmonary:     Effort: Pulmonary effort is normal.  Breath sounds: Normal breath sounds.  Genitourinary:    Rectum: Normal. Guaiac result negative. No mass or external hemorrhoid.  Musculoskeletal:     Cervical back: Neck supple.  Skin:    General: Skin is warm and dry.  Neurological:     Mental Status: He is alert and oriented to person, place, and time.  Psychiatric:        Mood and Affect: Mood normal.        Behavior: Behavior normal.        Thought Content: Thought content normal.        Judgment: Judgment normal.      No results found for any visits on 05/09/22.    The 10-year ASCVD risk score (Arnett DK, et al., 2019) is: 6.4%    Assessment & Plan:   Problem List Items Addressed This Visit        Cardiovascular and Mediastinum   Hemorrhoids    No evidence of hemorrhoids noted on exam today.  Encourage patient to discuss this with his GI doctor to determine if he needs further evaluation.  We will also order CBC, if anemia present would highly recommend he follow-up with GI.  He reports his understanding.      Relevant Orders   CBC     Respiratory   Allergic rhinitis    Recommend he continue use Flonase nasal spray as well as add over-the-counter antihistamine.  If symptoms persist encourage further evaluation and follow-up.  He reports his understanding.        Nervous and Auditory   Neuropathy - Primary    We will check blood work for further evaluation today including vitamin Z61, folate, metabolic panel.  We will treat with low-dose gabapentin twice a day as needed and refer to neurology for further assistance and evaluation.  Further recommendations may be made based upon lab results.      Relevant Medications   gabapentin (NEURONTIN) 100 MG capsule   ibuprofen (ADVIL) 600 MG tablet   Other Relevant Orders   Hemoglobin W9U   Basic metabolic panel   E45 and Folate Panel   Ambulatory referral to Neurology     Other   Unintentional weight loss    Seems to have stabilized, patient has gained 3 pounds since last office visit.  No further work-up recommended at this time.      Murmur    Murmur remains present, patient has upcoming appointment with cardiology for evaluation.  He was encouraged to follow-up as scheduled.       No follow-ups on file.    Ailene Ards, NP

## 2022-05-09 NOTE — Assessment & Plan Note (Signed)
No evidence of hemorrhoids noted on exam today.  Encourage patient to discuss this with his GI doctor to determine if he needs further evaluation.  We will also order CBC, if anemia present would highly recommend he follow-up with GI.  He reports his understanding.

## 2022-05-09 NOTE — Assessment & Plan Note (Signed)
Seems to have stabilized, patient has gained 3 pounds since last office visit.  No further work-up recommended at this time.

## 2022-05-09 NOTE — Assessment & Plan Note (Signed)
Recommend he continue use Flonase nasal spray as well as add over-the-counter antihistamine.  If symptoms persist encourage further evaluation and follow-up.  He reports his understanding.

## 2022-05-09 NOTE — Patient Instructions (Signed)
Look for either claritin OR zyrtec OR allegra and take as directed on package. Take at night.

## 2022-05-09 NOTE — Assessment & Plan Note (Signed)
Murmur remains present, patient has upcoming appointment with cardiology for evaluation.  He was encouraged to follow-up as scheduled.

## 2022-05-16 NOTE — Progress Notes (Unsigned)
Cardiology Office Note:    Date:  05/19/2022   ID:  [name], DOB [birth year], MRN [id number]  PCP:  Stephen Pollen, MD   [facility] Providers Cardiologist:  None     Referring MD: Stephen Ards, NP     History of Present Illness:    [name] is a 61 y.o. male with a hx of bipolar disorder, anxiety, and HLD who was referred by [name] for further evaluation of murmur.  Was remotely seen by Dr. Radford Ingram in 2015 for atypical chest pain. ETT at that time normal. TTE 2015 with normal BiV function, no significant valve disease.   Today, the patient overall feels well. No chest pain, SOB, LE edema, orthopnea or PND. No current palpitations (had occasional palpitations a month ago but they resolved). Rare lightheadedness when his sugar is low. He is able to walk without anginal symptoms.   Family history: [name] with HTN. No known history of CAD, heart failure.   Past Medical History:  Diagnosis Date   Anxiety    ARM PAIN, LEFT 11/20/2009   Arthritis    BIPOLAR DISORDER UNSPECIFIED 12/13/2007   patient denies   Blood transfusion without reported diagnosis    CHEST PAIN 03/23/2009   currently having    DEPRESSION 12/13/2007   FATIGUE 03/23/2009   GERD (gastroesophageal reflux disease)    GLAUCOMA 09/03/2009   Headache    HEMORRHOIDS 09/03/2009   HOARSENESS 09/03/2009   HYPERCHOLESTEROLEMIA 03/11/2010   MYALGIA 08/13/2009   NECK PAIN 01/18/2009   Palpitations 03/23/2009   Polydipsia 01/24/2008   Shortness of breath 03/23/2009   THYROID NODULE 08/28/2008   benign on Bx   THYROIDITIS 12/13/2007   Tubular adenoma of colon    Tubular adenoma of colon     Past Surgical History:  Procedure Laterality Date   BACK SURGERY     x 3, lumbar   EYE SURGERY     2008   FINGER SURGERY     TRANSANAL HEMORRHOIDAL DEARTERIALIZATION N/A 03/07/2016   Procedure: TRANSANAL HEMORRHOIDAL DEARTERIALIZATION HEMORRHOIDAL LIGATION/PEXY EXAM UNDER ANESTHESIA WITH  POSSIBLE  HEMORRHOIDECTOMY ;  Surgeon: Stephen Boston, MD;  Location: [facility];  Service: General;  Laterality: N/A;    Current Medications: Current Meds  Medication Sig   atorvastatin (LIPITOR) 20 MG tablet TAKE ONE TABLET BY MOUTH DAILY   clindamycin-benzoyl peroxide (BENZACLIN) gel Apply topically 2 (two) times daily.   diclofenac Sodium (VOLTAREN) 1 % GEL Apply 2 g topically 4 (four) times daily.   escitalopram (LEXAPRO) 10 MG tablet Take 10 mg by mouth daily.   fluticasone (FLONASE) 50 MCG/ACT nasal spray Place 1 spray into both nostrils 2 (two) times daily.   gabapentin (NEURONTIN) 100 MG capsule Take 1 capsule (100 mg total) by mouth 2 (two) times daily as needed.   ibuprofen (ADVIL) 600 MG tablet Take 1 tablet (600 mg total) by mouth every 8 (eight) hours as needed.   losartan-hydrochlorothiazide (HYZAAR) 50-12.5 MG tablet Take 0.5 tablets by mouth daily.   mirtazapine (REMERON) 45 MG tablet    Multiple Vitamin (MULTIVITAMIN) tablet Take 1 tablet by mouth daily.   omeprazole (PRILOSEC) 40 MG capsule Take 1 capsule (40 mg total) by mouth daily.   potassium chloride (KLOR-CON M) 10 MEQ tablet Take 1 tablet (10 mEq total) by mouth daily.   Prucalopride Succinate (MOTEGRITY) 2 MG TABS Take 1 tablet (2 mg total) by mouth daily.   Travoprost (TRAVATAN OP) Apply to eye at bedtime.   Travoprost,  BAK Free, (TRAVATAN) 0.004 % SOLN ophthalmic solution      Allergies:   Patient has no known allergies.   Social History   Socioeconomic History   Marital status: Divorced    Spouse name: Not on file   Number of children: 1   Years of education: Not on file   Highest education level: Not on file  Occupational History   Occupation: Museum/gallery conservator at [employer] Use   Smoking status: Never   Smokeless tobacco: Never  Vaping Use   Vaping Use: Never used  Substance and Sexual Activity   Alcohol use: No    Alcohol/week: 0.0 standard drinks of alcohol   Drug use: No   Sexual activity: Not on file   Other Topics Concern   Not on file  Social History Narrative   [name] lives with his wife in [place], 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       He is originally form [place]   Social Determinants of Sales executive: Not on [employer] Insecurity: Not on file  Transportation Needs: Not on file  Physical Activity: Not on file  Stress: Not on file  Social Connections: Not on file     Family History: The patient's family history includes Cancer (age of onset: 89) in his father; Colon cancer in his father; Dementia in his mother; Hypertension in an other family member.  ROS:   Please see the history of present illness.     All other systems reviewed and are negative.  EKGs/Labs/Other Studies Reviewed:    The following studies were reviewed today: TTE 2014/03/15: Study Conclusions   - Left ventricle: The cavity size was normal. Systolic function was    normal. Wall motion was normal; there were no regional wall    motion abnormalities.  - Atrial septum: No defect or patent foramen ovale was identified.  - Pericardium, extracardiac: A trivial pericardial effusion was    identified posterior to the heart.   EKG:  EKG 01/2022: NSR no ischemic changes  Recent Labs: 02/03/2022: ALT 41 02/04/2022: TSH 3.805 05/09/2022: Hemoglobin 13.3; Platelets 241.0 05/19/2022: BUN 21; Creatinine, Ser 1.15; Potassium 4.5; Sodium 138  Recent Lipid Panel    Component Value Date/Time   CHOL 146 08/19/2021 1359   CHOL 182 08/03/2020 1152   TRIG 135.0 08/19/2021 1359   HDL 71.50 08/19/2021 1359   HDL 82 08/03/2020 1152   CHOLHDL 2 08/19/2021 1359   VLDL 27.0 08/19/2021 1359   LDLCALC 47 08/19/2021 1359   LDLCALC 73 08/03/2020 1152   LDLDIRECT 66.0 06/15/2017 1708     Risk Assessment/Calculations:           Physical Exam:    VS:  BP 120/80   Pulse 76   Ht '5\' 5"'$  (1.651 m)   Wt 159 lb 3.2 oz (72.2 kg)   SpO2 95%   BMI 26.49 kg/m     Wt  Readings from Last 3 Encounters:  05/19/22 159 lb 3.2 oz (72.2 kg)  05/09/22 160 lb (72.6 kg)  03/19/22 157 lb 3.2 oz (71.3 kg)     GEN:  Well nourished, well developed in no acute distress HEENT: Normal NECK: No JVD; No carotid bruits LYMPHATICS: No lymphadenopathy CARDIAC: RRR, no murmurs, rubs, gallops RESPIRATORY:  Clear to auscultation without rales, wheezing or rhonchi  ABDOMEN: Soft, non-tender, non-distended MUSCULOSKELETAL:  No edema; No deformity  SKIN: Warm and dry NEUROLOGIC:  Alert and  oriented x 3 PSYCHIATRIC:  Normal affect   ASSESSMENT:    1. Murmur   2. Primary hypertension   3. Pure hypercholesterolemia    PLAN:    In order of problems listed above:  #Systolic Murmur: Patient with 2/6 systolic murmur on exam. No chest pain or heart failure symptoms. TTE in 2015 with no significant valve disease. Will repeat TTE for monitoring. -Check TTE  #HLD: -Continue lipitor '20mg'$  daily -LDL controlled at 47  #HTN: Well controlled and at goal <130/80. -Continue losartan-HCTZ 50-12.'5mg'$  daily          Medication Adjustments/Labs and Tests Ordered: Current medicines are reviewed at length with the patient today.  Concerns regarding medicines are outlined above.  Orders Placed This Encounter  Procedures   ECHOCARDIOGRAM COMPLETE   No orders of the defined types were placed in this encounter.   Patient Instructions  Medication Instructions:   Your physician recommends that you continue on your current medications as directed. Please refer to the Current Medication list given to you today.  *If you need a refill on your cardiac medications before your next appointment, please call your pharmacy*   Testing/Procedures:  Your physician has requested that you have an echocardiogram. Echocardiography is a painless test that uses sound waves to create images of your heart. It provides your doctor with information about the size and shape of your heart and how  well your heart's chambers and valves are working. This procedure takes approximately one hour. There are no restrictions for this procedure.   Follow-Up:  AS NEEDED WITH DR. Johney Frame     Important Information About Sugar         Signed, Freada Bergeron, MD  05/19/2022 3:45 PM    [facility]

## 2022-05-19 ENCOUNTER — Ambulatory Visit: Payer: Managed Care, Other (non HMO) | Admitting: Cardiology

## 2022-05-19 ENCOUNTER — Other Ambulatory Visit (INDEPENDENT_AMBULATORY_CARE_PROVIDER_SITE_OTHER): Payer: Managed Care, Other (non HMO)

## 2022-05-19 ENCOUNTER — Encounter: Payer: Self-pay | Admitting: Cardiology

## 2022-05-19 VITALS — BP 120/80 | HR 76 | Ht 65.0 in | Wt 159.2 lb

## 2022-05-19 DIAGNOSIS — E876 Hypokalemia: Secondary | ICD-10-CM | POA: Diagnosis not present

## 2022-05-19 DIAGNOSIS — R011 Cardiac murmur, unspecified: Secondary | ICD-10-CM

## 2022-05-19 DIAGNOSIS — I10 Essential (primary) hypertension: Secondary | ICD-10-CM | POA: Diagnosis not present

## 2022-05-19 DIAGNOSIS — E7800 Pure hypercholesterolemia, unspecified: Secondary | ICD-10-CM | POA: Diagnosis not present

## 2022-05-19 LAB — BASIC METABOLIC PANEL
BUN: 21 mg/dL (ref 6–23)
CO2: 29 mEq/L (ref 19–32)
Calcium: 9 mg/dL (ref 8.4–10.5)
Chloride: 103 mEq/L (ref 96–112)
Creatinine, Ser: 1.15 mg/dL (ref 0.40–1.50)
GFR: 69.97 mL/min (ref >60.00)
Glucose, Bld: 91 mg/dL (ref 70–99)
Potassium: 4.5 mEq/L (ref 3.5–5.1)
Sodium: 138 mEq/L (ref 135–145)

## 2022-05-19 NOTE — Patient Instructions (Signed)
Medication Instructions:   Your physician recommends that you continue on your current medications as directed. Please refer to the Current Medication list given to you today.  *If you need a refill on your cardiac medications before your next appointment, please call your pharmacy*   Testing/Procedures:  Your physician has requested that you have an echocardiogram. Echocardiography is a painless test that uses sound waves to create images of your heart. It provides your doctor with information about the size and shape of your heart and how well your heart's chambers and valves are working. This procedure takes approximately one hour. There are no restrictions for this procedure.   Follow-Up:  AS NEEDED WITH DR. Johney Frame     Important Information About Sugar

## 2022-05-22 ENCOUNTER — Telehealth: Payer: Self-pay | Admitting: Emergency Medicine

## 2022-05-22 NOTE — Telephone Encounter (Signed)
Patient is requesting call back with lab results from visit with [name] - he is at work and requests call back at work [phone], select option 3, then option 1

## 2022-05-26 NOTE — Telephone Encounter (Signed)
Called patient at work, he was not there. I called his cell also, but it went to voicemail. Left a message for a call back.

## 2022-05-26 NOTE — Telephone Encounter (Signed)
Patient called back, call taken by Grayland Jack. I asked her to inform patient that I would call him back shortly as I was doing nurse visits. Called patient again, no answer and I left another message for a call back.

## 2022-06-02 ENCOUNTER — Ambulatory Visit (HOSPITAL_COMMUNITY): Payer: Managed Care, Other (non HMO) | Attending: Cardiology

## 2022-06-02 DIAGNOSIS — R011 Cardiac murmur, unspecified: Secondary | ICD-10-CM | POA: Diagnosis present

## 2022-06-02 LAB — ECHOCARDIOGRAM COMPLETE
Area-P 1/2: 3.46 cm2
S' Lateral: 2.6 cm

## 2022-06-16 ENCOUNTER — Telehealth: Payer: Self-pay | Admitting: Internal Medicine

## 2022-06-16 MED ORDER — MOTEGRITY 2 MG PO TABS: 2.0000 mg | ORAL_TABLET | Freq: Every day | ORAL | 2 refills | Status: DC

## 2022-06-16 NOTE — Telephone Encounter (Signed)
Rx sent 

## 2022-06-16 NOTE — Telephone Encounter (Signed)
Patient called to let Dr. Hilarie Fredrickson know that the Motegrity was working well for him and that he needed more refills.  Thank you.

## 2022-06-20 ENCOUNTER — Ambulatory Visit: Payer: Managed Care, Other (non HMO) | Admitting: Gastroenterology

## 2022-07-21 ENCOUNTER — Encounter: Payer: Self-pay | Admitting: Neurology

## 2022-08-25 ENCOUNTER — Ambulatory Visit (INDEPENDENT_AMBULATORY_CARE_PROVIDER_SITE_OTHER): Payer: Managed Care, Other (non HMO) | Admitting: Emergency Medicine

## 2022-08-25 ENCOUNTER — Ambulatory Visit (INDEPENDENT_AMBULATORY_CARE_PROVIDER_SITE_OTHER): Payer: Managed Care, Other (non HMO)

## 2022-08-25 ENCOUNTER — Encounter: Payer: Self-pay | Admitting: Emergency Medicine

## 2022-08-25 VITALS — BP 132/76 | HR 67 | Temp 98.1°F | Ht 65.0 in | Wt 156.2 lb

## 2022-08-25 DIAGNOSIS — R051 Acute cough: Secondary | ICD-10-CM

## 2022-08-25 DIAGNOSIS — Z13220 Encounter for screening for lipoid disorders: Secondary | ICD-10-CM

## 2022-08-25 DIAGNOSIS — Z13228 Encounter for screening for other metabolic disorders: Secondary | ICD-10-CM

## 2022-08-25 DIAGNOSIS — M5431 Sciatica, right side: Secondary | ICD-10-CM | POA: Diagnosis not present

## 2022-08-25 DIAGNOSIS — E876 Hypokalemia: Secondary | ICD-10-CM

## 2022-08-25 DIAGNOSIS — Z114 Encounter for screening for human immunodeficiency virus [HIV]: Secondary | ICD-10-CM

## 2022-08-25 DIAGNOSIS — Z130 Encounter for screening for diseases of the blood and blood-forming organs and certain disorders involving the immune mechanism: Secondary | ICD-10-CM

## 2022-08-25 DIAGNOSIS — K219 Gastro-esophageal reflux disease without esophagitis: Secondary | ICD-10-CM

## 2022-08-25 DIAGNOSIS — Z125 Encounter for screening for malignant neoplasm of prostate: Secondary | ICD-10-CM

## 2022-08-25 DIAGNOSIS — Z1329 Encounter for screening for other suspected endocrine disorder: Secondary | ICD-10-CM | POA: Diagnosis not present

## 2022-08-25 DIAGNOSIS — G629 Polyneuropathy, unspecified: Secondary | ICD-10-CM | POA: Diagnosis not present

## 2022-08-25 DIAGNOSIS — Z0000 Encounter for general adult medical examination without abnormal findings: Secondary | ICD-10-CM

## 2022-08-25 LAB — COMPREHENSIVE METABOLIC PANEL
ALT: 34 U/L (ref 0–53)
AST: 27 U/L (ref 0–37)
Albumin: 4.7 g/dL (ref 3.5–5.2)
Alkaline Phosphatase: 118 U/L — ABNORMAL HIGH (ref 39–117)
BUN: 18 mg/dL (ref 6–23)
CO2: 28 mEq/L (ref 19–32)
Calcium: 9.4 mg/dL (ref 8.4–10.5)
Chloride: 103 mEq/L (ref 96–112)
Creatinine, Ser: 0.9 mg/dL (ref 0.40–1.50)
GFR: 93.73 mL/min (ref >60.00)
Glucose, Bld: 80 mg/dL (ref 70–99)
Potassium: 4.1 mEq/L (ref 3.5–5.1)
Sodium: 139 mEq/L (ref 135–145)
Total Bilirubin: 0.6 mg/dL (ref 0.2–1.2)
Total Protein: 7.1 g/dL (ref 6.0–8.3)

## 2022-08-25 LAB — CBC WITH DIFFERENTIAL/PLATELET
Basophils Absolute: 0 10*3/uL (ref 0.0–0.1)
Basophils Relative: 0.4 % (ref 0.0–3.0)
Eosinophils Absolute: 0 10*3/uL (ref 0.0–0.7)
Eosinophils Relative: 0.9 % (ref 0.0–5.0)
HCT: 41 % (ref 39.0–52.0)
Hemoglobin: 13.7 g/dL (ref 13.0–17.0)
Lymphocytes Relative: 62.3 % — ABNORMAL HIGH (ref 12.0–46.0)
Lymphs Abs: 3.5 10*3/uL (ref 0.7–4.0)
MCHC: 33.5 g/dL (ref 30.0–36.0)
MCV: 86.7 fl (ref 78.0–100.0)
Monocytes Absolute: 0.4 10*3/uL (ref 0.1–1.0)
Monocytes Relative: 7.9 % (ref 3.0–12.0)
Neutro Abs: 1.6 10*3/uL (ref 1.4–7.7)
Neutrophils Relative %: 28.5 % — ABNORMAL LOW (ref 43.0–77.0)
Platelets: 266 10*3/uL (ref 150.0–400.0)
RBC: 4.72 Mil/uL (ref 4.22–5.81)
RDW: 13.5 % (ref 11.5–15.5)
WBC: 5.7 10*3/uL (ref 4.0–10.5)

## 2022-08-25 LAB — LIPID PANEL
Cholesterol: 176 mg/dL (ref 0–200)
HDL: 79.2 mg/dL (ref >39.00)
LDL Cholesterol: 82 mg/dL (ref 0–99)
NonHDL: 97.28
Total CHOL/HDL Ratio: 2
Triglycerides: 78 mg/dL (ref 0.0–149.0)
VLDL: 15.6 mg/dL (ref 0.0–40.0)

## 2022-08-25 LAB — PSA: PSA: 0.38 ng/mL (ref 0.10–4.00)

## 2022-08-25 LAB — HEMOGLOBIN A1C: Hgb A1c MFr Bld: 6.2 % (ref 4.6–6.5)

## 2022-08-25 MED ORDER — POTASSIUM CHLORIDE CRYS ER 10 MEQ PO TBCR: 10.0000 meq | EXTENDED_RELEASE_TABLET | Freq: Every day | ORAL | 2 refills | Status: DC

## 2022-08-25 MED ORDER — FLUTICASONE PROPIONATE 50 MCG/ACT NA SUSP: 1.0000 | Freq: Two times a day (BID) | NASAL | 6 refills | Status: DC

## 2022-08-25 MED ORDER — GABAPENTIN 100 MG PO CAPS: 100.0000 mg | ORAL_CAPSULE | Freq: Two times a day (BID) | ORAL | 2 refills | Status: DC | PRN

## 2022-08-25 MED ORDER — IBUPROFEN 600 MG PO TABS: 600.0000 mg | ORAL_TABLET | Freq: Three times a day (TID) | ORAL | 0 refills | Status: DC | PRN

## 2022-08-25 MED ORDER — SILDENAFIL CITRATE 100 MG PO TABS: 50.0000 mg | ORAL_TABLET | Freq: Every day | ORAL | 3 refills | Status: DC | PRN

## 2022-08-25 MED ORDER — OMEPRAZOLE 40 MG PO CPDR: 40.0000 mg | DELAYED_RELEASE_CAPSULE | Freq: Every day | ORAL | 1 refills | Status: DC

## 2022-08-25 MED ORDER — METHOCARBAMOL 500 MG PO TABS: ORAL_TABLET | ORAL | 0 refills | Status: DC

## 2022-08-25 NOTE — Progress Notes (Signed)
[name] 61 y.o.   Chief Complaint  Patient presents with   Annual Exam   Cough    Patient complaining of cough and congestion x 1 month     HISTORY OF PRESENT ILLNESS: This is a 61 y.o. male here for annual exam. Overall doing well.  However complaining of nonproductive cough and congestion for 1 month. No other complaints or medical concerns today.  Cough Pertinent negatives include no chest pain, chills, fever, headaches, hemoptysis, rash, sore throat, shortness of breath or wheezing.     Prior to Admission medications   Medication Sig Start Date End Date Taking? Authorizing Provider  atorvastatin (LIPITOR) 20 MG tablet TAKE ONE TABLET BY MOUTH DAILY 03/24/22  Yes Delron Comer, [name], MD  clindamycin-benzoyl peroxide [facility]) gel Apply topically 2 (two) times daily. 05/28/20  Yes Maximiano Coss, NP  diclofenac Sodium (VOLTAREN) 1 % GEL Apply 2 g topically 4 (four) times daily. 08/03/20  Yes Jacelyn Pi, Irma M, MD  escitalopram (LEXAPRO) 10 MG tablet Take 10 mg by mouth daily.   Yes [provider]  fluticasone (FLONASE) 50 MCG/ACT nasal spray Place 1 spray into both nostrils 2 (two) times daily. 08/19/21  Yes Drury Ardizzone, [name], MD  gabapentin (NEURONTIN) 100 MG capsule Take 1 capsule (100 mg total) by mouth 2 (two) times daily as needed. 05/09/22  Yes Ailene Ards, NP  ibuprofen (ADVIL) 600 MG tablet Take 1 tablet (600 mg total) by mouth every 8 (eight) hours as needed. 05/09/22  Yes Ailene Ards, NP  methocarbamol (ROBAXIN) 500 MG tablet TAKE ONE TABLET BY MOUTH EVERY MORNING AND AFTERNOON. TAKE TWO TABLETS BY MOUTH EVERY NIGHT AT BEDTIME AS NEEDED FOR MUSCLE RELAXATION 01/30/22  Yes Ailene Ards, NP  mirtazapine (REMERON) 45 MG tablet  06/02/17  Yes [provider]  Multiple Vitamin (MULTIVITAMIN) tablet Take 1 tablet by mouth daily.   Yes [provider]  omeprazole (PRILOSEC) 40 MG capsule Take 1 capsule (40 mg total) by mouth daily.  03/10/22  Yes Pyrtle, Lajuan Lines, MD  potassium chloride (KLOR-CON M) 10 MEQ tablet Take 1 tablet (10 mEq total) by mouth daily. 05/09/22  Yes Ailene Ards, NP  Prucalopride Succinate (MOTEGRITY) 2 MG TABS Take 1 tablet (2 mg total) by mouth daily. 06/16/22  Yes Pyrtle, Lajuan Lines, MD  sildenafil (VIAGRA) 100 MG tablet Take 0.5-1 tablets (50-100 mg total) by mouth daily as needed for erectile dysfunction. 08/19/21  Yes Lamyiah Crawshaw, [name], MD  Travoprost (TRAVATAN OP) Apply to eye at bedtime.   Yes [provider]  Travoprost, BAK Free, (TRAVATAN) 0.004 % SOLN ophthalmic solution  09/28/20  Yes [provider]  losartan-hydrochlorothiazide (HYZAAR) 50-12.5 MG tablet Take 0.5 tablets by mouth daily. 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   History of fusion of lumbar spine 09/24/2021   Spinal stenosis of lumbar region 09/24/2021   Prolapsed internal hemorrhoids, grade 3, s/p [facility] ligation/pexy 03/07/2016 03/07/2016   Depression 08/16/2015   Erectile dysfunction 04/25/2015   Disorder of liver 10/13/2011   HYPERCHOLESTEROLEMIA 03/11/2010   GLAUCOMA 09/03/2009   THYROID NODULE 08/28/2008   Thyroiditis  12/13/2007   BIPOLAR DISORDER UNSPECIFIED 12/13/2007    Past Medical History:  Diagnosis Date   Anxiety    ARM PAIN, LEFT 11/20/2009   Arthritis    BIPOLAR DISORDER UNSPECIFIED 12/13/2007   patient denies   Blood transfusion without reported diagnosis    CHEST PAIN 03/23/2009   currently having    DEPRESSION 12/13/2007   FATIGUE 03/23/2009   GERD (gastroesophageal reflux disease)    GLAUCOMA 09/03/2009   Headache    HEMORRHOIDS  09/03/2009   HOARSENESS 09/03/2009   HYPERCHOLESTEROLEMIA 03/11/2010   MYALGIA 08/13/2009   NECK PAIN 01/18/2009   Palpitations 03/23/2009   Polydipsia 01/24/2008   Shortness of breath 03/23/2009   THYROID NODULE 08/28/2008   benign on Bx   THYROIDITIS 12/13/2007   Tubular adenoma of colon    Tubular adenoma of colon     Past Surgical History:  Procedure Laterality Date   BACK SURGERY     x 3, lumbar   EYE SURGERY     2008   FINGER SURGERY     TRANSANAL HEMORRHOIDAL DEARTERIALIZATION N/A 03/07/2016   Procedure: TRANSANAL HEMORRHOIDAL DEARTERIALIZATION HEMORRHOIDAL LIGATION/PEXY EXAM UNDER ANESTHESIA WITH  POSSIBLE HEMORRHOIDECTOMY ;  Surgeon: Michael Boston, MD;  Location: [facility];  Service: General;  Laterality: N/A;    Social History   Socioeconomic History   Marital status: Divorced    Spouse name: Not on file   Number of children: 1   Years of education: Not on file   Highest education level: Not on file  Occupational History   Occupation: Museum/gallery conservator at [employer] Use   Smoking status: Never   Smokeless tobacco: Never  Vaping Use   Vaping Use: Never used  Substance and Sexual Activity   Alcohol use: No    Alcohol/week: 0.0 standard drinks of alcohol   Drug use: No   Sexual activity: Not on file  Other Topics Concern   Not on file  Social History Narrative   Mr Dollar lives with his wife in [place], 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       He is originally form [place]   Social Determinants of Sales executive: Not on [employer] Insecurity: Not on file  Transportation Needs: Not on file  Physical Activity: Not on file  Stress: Not on file  Social Connections: Not on file  Intimate Partner Violence: Not on file    Family History  Problem Relation Age of Onset   Cancer Father 78       colon   Colon cancer Father    Dementia Mother    Hypertension Other      Review of Systems  Constitutional:  Negative.  Negative for chills and fever.  HENT:  Positive for congestion. Negative for sore throat.   Respiratory:  Positive for cough. Negative for hemoptysis, sputum production, shortness of breath and wheezing.   Cardiovascular: Negative.  Negative for chest pain and palpitations.  Gastrointestinal: Negative.  Negative for abdominal pain, diarrhea, nausea and vomiting.  Genitourinary: Negative.  Negative for dysuria and hematuria.  Skin: Negative.  Negative for rash.  Neurological: Negative.  Negative for dizziness and headaches.  All other systems reviewed and are negative.   Today's Vitals   08/25/22 1451  BP: 132/76  Pulse: 67  Temp: 98.1 F (36.7 C)  TempSrc: Oral  SpO2: 95%  Weight: 156 lb 4 oz (70.9 kg)  Height: '5\' 5"'$  (1.651 m)  Body mass index is 26 kg/m.  Physical Exam Vitals reviewed.  Constitutional:      Appearance: Normal appearance.  HENT:     Head: Normocephalic.     Right Ear: Tympanic membrane, ear canal and external ear normal.     Left Ear: Tympanic membrane, ear canal and external ear normal.     Mouth/Throat:     Mouth: Mucous membranes are moist.     Pharynx: Oropharynx is clear.  Eyes:     Extraocular Movements: Extraocular movements intact.     Conjunctiva/sclera: Conjunctivae normal.     Pupils: Pupils are equal, round, and reactive to light.  Cardiovascular:     Rate and Rhythm: Normal rate and regular rhythm.     Pulses: Normal pulses.     Heart sounds: Murmur heard.  Pulmonary:     Effort: Pulmonary effort is normal.     Breath sounds: Normal breath sounds.  Abdominal:     Palpations: Abdomen is soft.     Tenderness: There is no abdominal tenderness.  Musculoskeletal:     Cervical back: No tenderness.     Right lower leg: No edema.     Left lower leg: No edema.  Lymphadenopathy:     Cervical: No cervical adenopathy.  Skin:    General: Skin is warm and dry.     Capillary Refill: Capillary refill takes less than 2 seconds.   Neurological:     General: No focal deficit present.     Mental Status: He is alert and oriented to person, place, and time.  Psychiatric:        Mood and Affect: Mood normal.        Behavior: Behavior normal.    DG Chest 2 View  Result Date: 08/25/2022 CLINICAL DATA:  Cough EXAM: CHEST - 2 VIEW COMPARISON:  08/17/2017 FINDINGS: The heart size and mediastinal contours are within normal limits. Both lungs are clear. The visualized skeletal structures are unremarkable. IMPRESSION: No active cardiopulmonary disease. Electronically Signed   By: Davina Poke D.O.   On: 08/25/2022 16:10     ASSESSMENT & PLAN: Problem List Items Addressed This Visit       Nervous and Auditory   Sciatica of right side   Relevant Medications   gabapentin (NEURONTIN) 100 MG capsule   methocarbamol (ROBAXIN) 500 MG tablet   Neuropathy   Relevant Medications   ibuprofen (ADVIL) 600 MG tablet   gabapentin (NEURONTIN) 100 MG capsule   Other Visit Diagnoses     Routine general medical examination at a health care facility    -  Primary   Hypokalemia       Relevant Medications   potassium chloride (KLOR-CON M) 10 MEQ tablet   Gastroesophageal reflux disease without esophagitis       Relevant Medications   omeprazole (PRILOSEC) 40 MG capsule   Acute cough       Relevant Orders   DG Chest 2 View   Prostate cancer screening       Relevant Orders   PSA(Must document that pt has been informed of limitations of PSA testing.)   Screening for deficiency anemia       Relevant Orders   CBC with Differential   Screening for lipoid disorders       Relevant Orders   Lipid panel   Screening for endocrine, metabolic and immunity disorder       Relevant Orders   Comprehensive metabolic panel   Hemoglobin A1c   Urinalysis  RPR   Screening for HIV (human immunodeficiency virus)       Relevant Orders   HIV Antibody (routine testing w rflx)      Modifiable risk factors discussed with  patient. Anticipatory guidance according to age provided. The following topics were also discussed: Social Determinants of Health Smoking.  Non-smoker Diet and nutrition.  Good eating habits. Benefits of exercise Cancer screening and review of most recent colonoscopy report Vaccinations reviewed and recommendations Cardiovascular risk assessment The 10-year ASCVD risk score (Arnett DK, et al., 2019) is: 11.2%   Values used to calculate the score:     Age: 36 years     Sex: Male     Is Non-Hispanic African American: Yes     Diabetic: No     Tobacco smoker: No     Systolic Blood Pressure: 338 mmHg     Is BP treated: Yes     HDL Cholesterol: 71.5 mg/dL     Total Cholesterol: 146 mg/dL Review of all medications and chronic medical conditions Mental health including depression and anxiety Fall and accident prevention  Patient Instructions  Health Maintenance, Male Adopting a healthy lifestyle and getting preventive care are important in promoting health and wellness. Ask your health care provider about: The right schedule for you to have regular tests and exams. Things you can do on your own to prevent diseases and keep yourself healthy. What should I know about diet, weight, and exercise? Eat a healthy diet  Eat a diet that includes plenty of vegetables, fruits, low-fat dairy products, and lean protein. Do not eat a lot of foods that are high in solid fats, added sugars, or sodium. Maintain a healthy weight Body mass index (BMI) is a measurement that can be used to identify possible weight problems. It estimates body fat based on height and weight. Your health care provider can help determine your BMI and help you achieve or maintain a healthy weight. Get regular exercise Get regular exercise. This is one of the most important things you can do for your health. Most adults should: Exercise for at least 150 minutes each week. The exercise should increase your heart rate and make you  sweat (moderate-intensity exercise). Do strengthening exercises at least twice a week. This is in addition to the moderate-intensity exercise. Spend less time sitting. Even light physical activity can be beneficial. Watch cholesterol and blood lipids Have your blood tested for lipids and cholesterol at 58 years of age, then have this test every 5 years. You may need to have your cholesterol levels checked more often if: Your lipid or cholesterol levels are high. You are older than 59 years of age. You are at high risk for heart disease. What should I know about cancer screening? Many types of cancers can be detected early and may often be prevented. Depending on your health history and family history, you may need to have cancer screening at various ages. This may include screening for: Colorectal cancer. Prostate cancer. Skin cancer. Lung cancer. What should I know about heart disease, diabetes, and high blood pressure? Blood pressure and heart disease High blood pressure causes heart disease and increases the risk of stroke. This is more likely to develop in people who have high blood pressure readings or are overweight. Talk with your health care provider about your target blood pressure readings. Have your blood pressure checked: Every 3-5 years if you are 47-74 years of age. Every year if you are 35 years old or older.  If you are between the ages of 48 and 102 and are a current or former smoker, ask your health care provider if you should have a one-time screening for abdominal aortic aneurysm (AAA). Diabetes Have regular diabetes screenings. This checks your fasting blood sugar level. Have the screening done: Once every three years after age 34 if you are at a normal weight and have a low risk for diabetes. More often and at a younger age if you are overweight or have a high risk for diabetes. What should I know about preventing infection? Hepatitis B If you have a higher risk for  hepatitis B, you should be screened for this virus. Talk with your health care provider to find out if you are at risk for hepatitis B infection. Hepatitis C Blood testing is recommended for: Everyone born from [birth year] through 1965. Anyone with known risk factors for hepatitis C. Sexually transmitted infections (STIs) You should be screened each year for STIs, including gonorrhea and chlamydia, if: You are sexually active and are younger than 59 years of age. You are older than 59 years of age and your health care provider tells you that you are at risk for this type of infection. Your sexual activity has changed since you were last screened, and you are at increased risk for chlamydia or gonorrhea. Ask your health care provider if you are at risk. Ask your health care provider about whether you are at high risk for HIV. Your health care provider may recommend a prescription medicine to help prevent HIV infection. If you choose to take medicine to prevent HIV, you should first get tested for HIV. You should then be tested every 3 months for as long as you are taking the medicine. Follow these instructions at home: Alcohol use Do not drink alcohol if your health care provider tells you not to drink. If you drink alcohol: Limit how much you have to 0-2 drinks a day. Know how much alcohol is in your drink. In the [place] one drink equals one 12 oz bottle of beer (355 mL), one 5 oz glass of wine (148 mL), or one 1 oz glass of hard liquor (44 mL). Lifestyle Do not use any products that contain nicotine or tobacco. These products include cigarettes, chewing tobacco, and vaping devices, such as e-cigarettes. If you need help quitting, ask your health care provider. Do not use street drugs. Do not share needles. Ask your health care provider for help if you need support or information about quitting drugs. General instructions Schedule regular health, dental, and eye exams. Stay current with your  vaccines. Tell your health care provider if: You often feel depressed. You have ever been abused or do not feel safe at home. Summary Adopting a healthy lifestyle and getting preventive care are important in promoting health and wellness. Follow your health care provider's instructions about healthy diet, exercising, and getting tested or screened for diseases. Follow your health care provider's instructions on monitoring your cholesterol and blood pressure. This information is not intended to replace advice given to you by your health care provider. Make sure you discuss any questions you have with your health care provider. Document Revised: 03/04/2021 Document Reviewed: 03/04/2021 Elsevier Patient Education  [employer], MD [facility]

## 2022-08-25 NOTE — Patient Instructions (Signed)
Health Maintenance, Male Adopting a healthy lifestyle and getting preventive care are important in promoting health and wellness. Ask your health care provider about: The right schedule for you to have regular tests and exams. Things you can do on your own to prevent diseases and keep yourself healthy. What should I know about diet, weight, and exercise? Eat a healthy diet  Eat a diet that includes plenty of vegetables, fruits, low-fat dairy products, and lean protein. Do not eat a lot of foods that are high in solid fats, added sugars, or sodium. Maintain a healthy weight Body mass index (BMI) is a measurement that can be used to identify possible weight problems. It estimates body fat based on height and weight. Your health care provider can help determine your BMI and help you achieve or maintain a healthy weight. Get regular exercise Get regular exercise. This is one of the most important things you can do for your health. Most adults should: Exercise for at least 150 minutes each week. The exercise should increase your heart rate and make you sweat (moderate-intensity exercise). Do strengthening exercises at least twice a week. This is in addition to the moderate-intensity exercise. Spend less time sitting. Even light physical activity can be beneficial. Watch cholesterol and blood lipids Have your blood tested for lipids and cholesterol at 59 years of age, then have this test every 5 years. You may need to have your cholesterol levels checked more often if: Your lipid or cholesterol levels are high. You are older than 59 years of age. You are at high risk for heart disease. What should I know about cancer screening? Many types of cancers can be detected early and may often be prevented. Depending on your health history and family history, you may need to have cancer screening at various ages. This may include screening for: Colorectal cancer. Prostate cancer. Skin cancer. Lung  cancer. What should I know about heart disease, diabetes, and high blood pressure? Blood pressure and heart disease High blood pressure causes heart disease and increases the risk of stroke. This is more likely to develop in people who have high blood pressure readings or are overweight. Talk with your health care provider about your target blood pressure readings. Have your blood pressure checked: Every 3-5 years if you are 18-39 years of age. Every year if you are 40 years old or older. If you are between the ages of 65 and 75 and are a current or former smoker, ask your health care provider if you should have a one-time screening for abdominal aortic aneurysm (AAA). Diabetes Have regular diabetes screenings. This checks your fasting blood sugar level. Have the screening done: Once every three years after age 45 if you are at a normal weight and have a low risk for diabetes. More often and at a younger age if you are overweight or have a high risk for diabetes. What should I know about preventing infection? Hepatitis B If you have a higher risk for hepatitis B, you should be screened for this virus. Talk with your health care provider to find out if you are at risk for hepatitis B infection. Hepatitis C Blood testing is recommended for: Everyone born from 1945 through 1965. Anyone with known risk factors for hepatitis C. Sexually transmitted infections (STIs) You should be screened each year for STIs, including gonorrhea and chlamydia, if: You are sexually active and are younger than 59 years of age. You are older than 59 years of age and your   health care provider tells you that you are at risk for this type of infection. Your sexual activity has changed since you were last screened, and you are at increased risk for chlamydia or gonorrhea. Ask your health care provider if you are at risk. Ask your health care provider about whether you are at high risk for HIV. Your health care provider  may recommend a prescription medicine to help prevent HIV infection. If you choose to take medicine to prevent HIV, you should first get tested for HIV. You should then be tested every 3 months for as long as you are taking the medicine. Follow these instructions at home: Alcohol use Do not drink alcohol if your health care provider tells you not to drink. If you drink alcohol: Limit how much you have to 0-2 drinks a day. Know how much alcohol is in your drink. In the [place] one drink equals one 12 oz bottle of beer (355 mL), one 5 oz glass of wine (148 mL), or one 1 oz glass of hard liquor (44 mL). Lifestyle Do not use any products that contain nicotine or tobacco. These products include cigarettes, chewing tobacco, and vaping devices, such as e-cigarettes. If you need help quitting, ask your health care provider. Do not use street drugs. Do not share needles. Ask your health care provider for help if you need support or information about quitting drugs. General instructions Schedule regular health, dental, and eye exams. Stay current with your vaccines. Tell your health care provider if: You often feel depressed. You have ever been abused or do not feel safe at home. Summary Adopting a healthy lifestyle and getting preventive care are important in promoting health and wellness. Follow your health care provider's instructions about healthy diet, exercising, and getting tested or screened for diseases. Follow your health care provider's instructions on monitoring your cholesterol and blood pressure. This information is not intended to replace advice given to you by your health care provider. Make sure you discuss any questions you have with your health care provider. Document Revised: 03/04/2021 Document Reviewed: 03/04/2021 Elsevier Patient Education  2023 Elsevier Inc.  

## 2022-08-26 LAB — URINALYSIS
Bilirubin Urine: NEGATIVE
Hgb urine dipstick: NEGATIVE
Ketones, ur: NEGATIVE
Leukocytes,Ua: NEGATIVE
Nitrite: NEGATIVE
Specific Gravity, Urine: 1.01 (ref 1.000–1.030)
Total Protein, Urine: NEGATIVE
Urine Glucose: NEGATIVE
Urobilinogen, UA: 0.2 (ref 0.0–1.0)
pH: 6.5 (ref 5.0–8.0)

## 2022-08-26 LAB — HIV ANTIBODY (ROUTINE TESTING W REFLEX): HIV 1&2 Ab, 4th Generation: NONREACTIVE

## 2022-08-26 LAB — RPR: RPR Ser Ql: NONREACTIVE

## 2022-09-26 ENCOUNTER — Ambulatory Visit: Payer: Managed Care, Other (non HMO) | Admitting: Internal Medicine

## 2022-09-29 ENCOUNTER — Ambulatory Visit: Payer: Managed Care, Other (non HMO) | Admitting: Neurology

## 2022-10-08 ENCOUNTER — Other Ambulatory Visit: Payer: Self-pay | Admitting: Internal Medicine

## 2022-10-13 ENCOUNTER — Other Ambulatory Visit: Payer: Self-pay | Admitting: Emergency Medicine

## 2022-10-13 DIAGNOSIS — G629 Polyneuropathy, unspecified: Secondary | ICD-10-CM

## 2022-10-14 MED ORDER — IBUPROFEN 600 MG PO TABS: 600.0000 mg | ORAL_TABLET | Freq: Three times a day (TID) | ORAL | 0 refills | Status: DC | PRN

## 2022-11-07 ENCOUNTER — Ambulatory Visit: Payer: Managed Care, Other (non HMO) | Admitting: Internal Medicine

## 2022-11-11 ENCOUNTER — Encounter: Payer: Self-pay | Admitting: Neurology

## 2022-11-11 ENCOUNTER — Ambulatory Visit: Payer: Managed Care, Other (non HMO) | Admitting: Neurology

## 2022-11-11 VITALS — BP 129/84 | HR 86 | Ht 65.0 in | Wt 164.0 lb

## 2022-11-11 DIAGNOSIS — M79672 Pain in left foot: Secondary | ICD-10-CM

## 2022-11-11 DIAGNOSIS — M79671 Pain in right foot: Secondary | ICD-10-CM

## 2022-11-11 MED ORDER — GABAPENTIN 300 MG PO CAPS: ORAL_CAPSULE | ORAL | 3 refills | Status: AC

## 2022-11-11 NOTE — Progress Notes (Signed)
[facility] Clinic Note - Initial Visit   Date: 11/11/2022   [name] MRN: [id number] DOB: 04/19/1961   Dear Dr. Mitchel Honour:  Thank you for your kind referral of [name] for consultation of bilateral feet paresthesias. Although his history is well known to you, please allow [place] to reiterate it for the purpose of our medical record. The patient was accompanied to the clinic by self.     [name] is a 62 y.o. right-handed male from [place] with depression, hyperlipidemia, hypertension, prediabetes, and s/p lumbar fusion at L4-5 presenting for evaluation of bilateral feet paresthesias.   IMPRESSION/PLAN: Bilateral feet paresthesias, possibly neuropathy vs S1 radiculopathy.  Neurological exam is unremarkable without distal sensory loss.   - NCS/EMG of the legs to better characterize the nature of his symptoms  - If NCS/EMG is normal, further testing with skin biopsy for small fiber neuropathy may be indicated  - Start gabapentin '300mg'$  at bedtime x 1 week, then increase to '600mg'$  at bedtime.  Side effects discussed  Return to clinic in 3 months  ------------------------------------------------------------- History of present illness: Starting around a year ago, he has burning pain involving the soles of the feet.  It is worse in the evening and nighttime.  He feels that it is worse with urination and defecation.  No problems with weakness or imbalance.  He was taking gabapentin '100mg'$  twice daily, but stopped it due to ineffectiveness.  He has chronic low back pain and had prior lumbar surgery. He was told he has prediabetes.  No history of alcohol or chemotherapy exposure.    Out-side paper records, electronic medical record, and images have been reviewed where available and summarized as:  Lab Results  Component Value Date   HGBA1C 6.2 08/25/2022   Lab Results  Component Value Date   [id number] 245 05/09/2022   Lab Results  Component Value  Date   TSH 3.805 02/04/2022   Lab Results  Component Value Date   ESRSEDRATE 20 (H) 02/02/2014    Past Medical History:  Diagnosis Date   Anxiety    ARM PAIN, LEFT 11/20/2009   Arthritis    BIPOLAR DISORDER UNSPECIFIED 12/13/2007   patient denies   Blood transfusion without reported diagnosis    CHEST PAIN 03/23/2009   currently having    DEPRESSION 12/13/2007   FATIGUE 03/23/2009   GERD (gastroesophageal reflux disease)    GLAUCOMA 09/03/2009   Headache    HEMORRHOIDS 09/03/2009   HOARSENESS 09/03/2009   HYPERCHOLESTEROLEMIA 03/11/2010   MYALGIA 08/13/2009   NECK PAIN 01/18/2009   Palpitations 03/23/2009   Polydipsia 01/24/2008   Shortness of breath 03/23/2009   THYROID NODULE 08/28/2008   benign on Bx   THYROIDITIS 12/13/2007   Tubular adenoma of colon    Tubular adenoma of colon     Past Surgical History:  Procedure Laterality Date   BACK SURGERY     x 3, lumbar   EYE SURGERY     2008   FINGER SURGERY     TRANSANAL HEMORRHOIDAL DEARTERIALIZATION N/A 03/07/2016   Procedure: TRANSANAL HEMORRHOIDAL DEARTERIALIZATION HEMORRHOIDAL LIGATION/PEXY EXAM UNDER ANESTHESIA WITH  POSSIBLE HEMORRHOIDECTOMY ;  Surgeon: Michael Boston, MD;  Location: [facility];  Service: General;  Laterality: N/A;     Medications:  Outpatient Encounter Medications as of 11/11/2022  Medication Sig   atorvastatin (LIPITOR) 20 MG tablet TAKE ONE TABLET BY MOUTH DAILY   benzonatate (TESSALON) 100 MG capsule Take 100 mg by mouth 3 (three) times daily.  clindamycin-benzoyl peroxide (BENZACLIN) gel Apply topically 2 (two) times daily.   diclofenac Sodium (VOLTAREN) 1 % GEL Apply 2 g topically 4 (four) times daily.   escitalopram (LEXAPRO) 10 MG tablet Take 10 mg by mouth daily.   fluticasone (FLONASE) 50 MCG/ACT nasal spray Place 1 spray into both nostrils 2 (two) times daily.   gabapentin (NEURONTIN) 100 MG capsule Take 1 capsule (100 mg total) by mouth 2 (two) times daily as needed.   ibuprofen  (ADVIL) 600 MG tablet Take 1 tablet (600 mg total) by mouth every 8 (eight) hours as needed.   methocarbamol (ROBAXIN) 500 MG tablet TAKE ONE TABLET BY MOUTH EVERY MORNING AND AFTERNOON. TAKE TWO TABLETS BY MOUTH EVERY NIGHT AT BEDTIME AS NEEDED FOR MUSCLE RELAXATION   mirtazapine (REMERON) 45 MG tablet    MOTEGRITY 2 MG TABS TAKE 1 TABLET BY MOUTH DAILY   Multiple Vitamin (MULTIVITAMIN) tablet Take 1 tablet by mouth daily.   omeprazole (PRILOSEC) 40 MG capsule Take 1 capsule (40 mg total) by mouth daily.   potassium chloride (KLOR-CON M) 10 MEQ tablet Take 1 tablet (10 mEq total) by mouth daily.   sildenafil (VIAGRA) 100 MG tablet Take 0.5-1 tablets (50-100 mg total) by mouth daily as needed for erectile dysfunction.   Travoprost (TRAVATAN OP) Apply to eye at bedtime.   Travoprost, BAK Free, (TRAVATAN) 0.004 % SOLN ophthalmic solution    [DISCONTINUED] losartan-hydrochlorothiazide (HYZAAR) 50-12.5 MG tablet Take 0.5 tablets by mouth daily. (Patient not taking: Reported on 08/25/2022)   No facility-administered encounter medications on file as of 11/11/2022.    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       He is originally form [place]         Are you right handed or left handed? Right Handed    Are you currently employed ? 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 Yes   Who lives with  you? No one    What type of home do you live in: 1 story or 2 story? Lives in a one story home        Vital Signs:  BP 129/84   Pulse 86   Ht '5\' 5"'$  (1.651 m)   Wt 164 lb (74.4 kg)   SpO2 98%   BMI 27.29 kg/m   Neurological Exam: MENTAL STATUS including orientation to time, place, person, recent and remote memory, attention span and concentration, language, and fund of knowledge is normal.  Speech is not dysarthric.  CRANIAL NERVES: II:  No visual field defects.     III-IV-VI: Pupils equal round and reactive to light.  Normal conjugate, extra-ocular eye movements in all directions of gaze.  No nystagmus.  No ptosis.   V:  Normal facial sensation.    VII:  Normal facial symmetry and movements.   VIII:  Normal hearing and vestibular function.   IX-X:  Normal palatal movement.   XI:  Normal shoulder shrug and head rotation.   XII:  Normal tongue strength and range of motion, no deviation or fasciculation.  MOTOR:  No atrophy, fasciculations or abnormal movements.  No pronator drift.   Upper Extremity:  Right  Left  Deltoid  5/5   5/5   Biceps  5/5   5/5   Triceps  5/5   5/5   Wrist extensors  5/5   5/5   Wrist flexors  5/5   5/5   Finger extensors  5/5   5/5   Finger flexors  5/5   5/5   Dorsal interossei  5/5   5/5   Abductor pollicis  5/5   5/5   Tone (Ashworth scale)  0  0   Lower Extremity:  Right  Left  Hip flexors  5/5   5/5   Hip extensors  5/5   5/5   Adductor 5/5  5/5  Abductor 5/5  5/5  Knee flexors  5/5   5/5   Knee extensors  5/5   5/5   Dorsiflexors  5/5   5/5   Plantarflexors  5/5   5/5   Toe extensors  5/5   5/5   Toe flexors  5/5   5/5   Tone (Ashworth scale)  0  0   MSRs:                                           Right        Left brachioradialis 2+  2+  biceps 2+  2+  triceps 2+  2+  patellar 2+  2+  ankle jerk 2  2+  Hoffman no  no  plantar response down  down   SENSORY:  Normal and symmetric perception of light touch, pinprick,  vibration, and proprioception.  Romberg's sign absent.   COORDINATION/GAIT: Normal finger-to- nose-finger.  Intact rapid alternating movements bilaterally.  Able to rise from a chair without using arms.  Gait narrow based and stable. Tandem and stressed gait intact.    Thank you for allowing me to participate in patient's care.  If I can answer any additional questions, I would be pleased to do so.    Sincerely,    Marquez Ceesay K. Posey Pronto, DO

## 2022-11-11 NOTE — Patient Instructions (Signed)
Nerve testing of the legs  Start gabapentin '300mg'$  at bedtime x 1 week, then increase to 2 tablets at bedtime  Return to clinic in 3 months

## 2022-11-24 ENCOUNTER — Ambulatory Visit: Payer: Managed Care, Other (non HMO) | Admitting: Emergency Medicine

## 2022-12-01 ENCOUNTER — Ambulatory Visit: Payer: Managed Care, Other (non HMO) | Admitting: Internal Medicine

## 2022-12-01 ENCOUNTER — Encounter: Payer: Self-pay | Admitting: Internal Medicine

## 2022-12-01 VITALS — BP 122/64 | HR 69 | Ht 65.0 in | Wt 160.0 lb

## 2022-12-01 DIAGNOSIS — K5904 Chronic idiopathic constipation: Secondary | ICD-10-CM

## 2022-12-01 DIAGNOSIS — R140 Abdominal distension (gaseous): Secondary | ICD-10-CM | POA: Diagnosis not present

## 2022-12-01 DIAGNOSIS — K219 Gastro-esophageal reflux disease without esophagitis: Secondary | ICD-10-CM

## 2022-12-01 DIAGNOSIS — K602 Anal fissure, unspecified: Secondary | ICD-10-CM | POA: Diagnosis not present

## 2022-12-01 MED ORDER — AMBULATORY NON FORMULARY MEDICATION: 0 refills | Status: DC

## 2022-12-01 MED ORDER — TRULANCE 3 MG PO TABS: 3.0000 mg | ORAL_TABLET | Freq: Every day | ORAL | 3 refills | Status: DC

## 2022-12-01 NOTE — Progress Notes (Signed)
   Subjective:    Patient ID: [name], male    DOB: Sep 02, 1961, 62 y.o.   MRN: [id number]  HPI Jeremia Groot is a 62 yo male with chronic idiopathic constipation, adenomatous and sessile serrated colon polyps, prior hemorrhoidectomy and GERD who is here for follow-up.  He was last seen in May 2023.  He is here alone today.  He reports that Trulance worked much better [employer].  With Motegrity which he is currently taking he is having 3-4 bowel movements a day but they are small and incomplete.  He is also having some abdominal bloating and increased flatulence.  For the last several weeks he has noticed pain with defecation as well as a small "bump" and skin irritation as well as some minor itching in the perianal area.  No significant blood with stool.  GERD has been well-controlled.  No dysphagia or odynophagia.   Review of Systems As per HPI, otherwise negative  Current Medications, Allergies, Past Medical History, Past Surgical History, Family History and Social History were reviewed in [employer] record.      Objective:   Physical Exam BP 122/64   Pulse 69   Ht '5\' 5"'$  (1.651 m)   Wt 160 lb (72.6 kg)   BMI 26.63 kg/m  Gen: awake, alert, NAD HEENT: anicteric Abd: soft, NT/ND, +BS throughout Ext: no c/c/e Neuro: nonfocal ANOSCOPY: Using a disposable, lubricated, slotted, self-illuminating anoscope, the rectum was intubated without difficulty. The trochar was removed and the ano-rectum was circumferentially inspected. There were no significant internal hemorrhoids.  There was posterior anal fissure and palpable pile. The rectal mucosa was not inflamed. No neoplasia or other pathology was identified. The inspection was well tolerated.       Assessment & Plan:  62 yo male with chronic idiopathic constipation, adenomatous and sessile serrated colon polyps, prior hemorrhoidectomy and GERD who is here for follow-up.   Anal fissure --posterior and  visualized today.  We need to work on the constipation to try to minimize the risk of future fissure and will treat with diltiazem gel to allow this to heal. -- Diltiazem gel 2% twice daily for 4 to 6 weeks ; can be used as needed thereafter for any anal spasm symptoms  2.  Chronic idiopathic constipation --previous nonresponse or ineffective/incomplete response to Linzess, Amitiza, lactulose, MiraLAX and now Motegrity.  Trulance previously worked very well.  We will try to get prior authorization again. -- Continue Motegrity 2 mg daily while we work on [employer] -- Switch to Trulance 3 mg daily which previously worked very well for him.  Co-pay card given but unfortunately I did not have samples today  3.  History of colon polyps --surveillance colonoscopy is due later this year.  His colonoscopy was not covered in 2020 and he had a very large bill which she is still paying.  We will precertified colonoscopy later this year and hopefully we will be in network.  If not he may need to seek colonoscopy elsewhere -- Colonoscopy recommended in December 2024 for surveillance  4.  GERD --history of mild esophagitis but without Barrett's.  Omeprazole working well. -- Continue omeprazole 40 mg daily  30 minutes total spent today including patient facing time, coordination of care, reviewing medical history/procedures/pertinent radiology studies, and documentation of the encounter.

## 2022-12-01 NOTE — Patient Instructions (Addendum)
If you are age 60 or younger, your body mass index should be between 19-25. Your Body mass index is 26.63 kg/m. If this is out of the aformentioned range listed, please consider follow up with your Primary Care Provider.  ________________________________________________________  The [facility] providers would like to encourage you to use [facility] to communicate with providers for non-urgent requests or questions.  Due to long hold times on the telephone, sending your provider a message by [facility] may be a faster and more efficient way to get a response.  Please allow 48 business hours for a response.  Please remember that this is for non-urgent requests.  _______________________________________________________  We have sent the following medications to your pharmacy for you to pick up at your convenience:  CONTINUE: Motegrity until Mellody Memos is approved by insurance.  Once Trulance is approved, you should discontinue Motegrity.  CONTINUE: omeprazole  We have sent a prescription for Diltiazem 2% gel to [facility] for you. Using your index finger, you should apply a small amount of medication inside the rectum up to your first knuckle/joint twice daily x 4 to 6 weeks.  [facility]'s information is below: Address: [address], [place],  [address]  [phone]  *Please DO NOT go directly from our office to pick up this medication! Give the pharmacy 1 day to process the prescription as this is compounded and takes time to make.  Please call our office if you are not feeling better after using Diltiazem gel.  Thank you for entrusting me with your care and choosing [facility].  Dr Hilarie Fredrickson

## 2022-12-24 ENCOUNTER — Other Ambulatory Visit: Payer: Self-pay | Admitting: Nurse Practitioner

## 2023-01-02 ENCOUNTER — Encounter: Payer: Managed Care, Other (non HMO) | Admitting: Neurology

## 2023-01-06 ENCOUNTER — Other Ambulatory Visit: Payer: Self-pay | Admitting: Emergency Medicine

## 2023-01-12 ENCOUNTER — Encounter: Payer: Self-pay | Admitting: Emergency Medicine

## 2023-01-12 ENCOUNTER — Ambulatory Visit: Payer: Managed Care, Other (non HMO) | Admitting: Emergency Medicine

## 2023-01-12 VITALS — BP 130/72 | HR 80 | Temp 98.6°F | Ht 65.0 in | Wt 164.0 lb

## 2023-01-12 DIAGNOSIS — R21 Rash and other nonspecific skin eruption: Secondary | ICD-10-CM | POA: Diagnosis not present

## 2023-01-12 MED ORDER — CLOTRIMAZOLE-BETAMETHASONE 1-0.05 % EX CREA: 1.0000 | TOPICAL_CREAM | Freq: Two times a day (BID) | CUTANEOUS | 3 refills | Status: DC

## 2023-01-12 NOTE — Progress Notes (Signed)
[name] 62 y.o.   Chief Complaint  Patient presents with   Rash    Patient has a rash on his genital area.    HISTORY OF PRESENT ILLNESS: Acute problem visit today. This is a 62 y.o. male complaining of itchy genital rash on and off for the past year No other associated symptoms No other needs or medical concerns today.  Rash Pertinent negatives include no congestion, cough, diarrhea, fever, shortness of breath, sore throat or vomiting.     Prior to Admission medications   Medication Sig Start Date End Date Taking? Authorizing Provider  atorvastatin (LIPITOR) 20 MG tablet TAKE 1 TABLET BY MOUTH DAILY 01/06/23  Yes Cianni Manny, [name], MD  diclofenac Sodium (VOLTAREN) 1 % GEL Apply 2 g topically 4 (four) times daily. 08/03/20  Yes Jacelyn Pi, Irma M, MD  escitalopram (LEXAPRO) 10 MG tablet Take 10 mg by mouth daily.   Yes [provider]  fluticasone (FLONASE) 50 MCG/ACT nasal spray Place 1 spray into both nostrils 2 (two) times daily. 08/25/22  Yes Klyde Banka, [name], MD  gabapentin (NEURONTIN) 300 MG capsule Take 1 tablet x 1 week, then increase 2 tablet at bedtime. 11/11/22  Yes Patel, Donika K, DO  ibuprofen (ADVIL) 600 MG tablet Take 1 tablet (600 mg total) by mouth every 8 (eight) hours as needed. 10/14/22  Yes Vitor Overbaugh, [name], MD  methocarbamol (ROBAXIN) 500 MG tablet TAKE ONE TABLET BY MOUTH EVERY MORNING AND AFTERNOON. TAKE TWO TABLETS BY MOUTH EVERY NIGHT AT BEDTIME AS NEEDED FOR MUSCLE RELAXATION 08/25/22  Yes Horald Pollen, MD  mirtazapine (REMERON) 45 MG tablet  06/02/17  Yes [provider]  Multiple Vitamin (MULTIVITAMIN) tablet Take 1 tablet by mouth daily.   Yes [provider]  omeprazole (PRILOSEC) 40 MG capsule Take 1 capsule (40 mg total) by mouth daily. 08/25/22  Yes Shuan Statzer, [name], MD  Plecanatide (TRULANCE) 3 MG TABS Take 1 tablet (3 mg total) by mouth daily. 12/01/22  Yes Pyrtle, Lajuan Lines, MD  potassium  chloride (KLOR-CON M) 10 MEQ tablet Take 1 tablet (10 mEq total) by mouth daily. 08/25/22  Yes Nayzeth Altman, [name], MD  sildenafil (VIAGRA) 100 MG tablet Take 0.5-1 tablets (50-100 mg total) by mouth daily as needed for erectile dysfunction. 08/25/22  Yes Tache Bobst, [name], MD  Travoprost (TRAVATAN OP) Apply to eye at bedtime.   Yes [provider]  Travoprost, BAK Free, (TRAVATAN) 0.004 % SOLN ophthalmic solution  09/28/20  Yes [provider]  clindamycin-benzoyl peroxide (BENZACLIN) gel Apply topically 2 (two) times daily. 05/28/20   Maximiano Coss, NP    No Known Allergies  Patient Active Problem List   Diagnosis Date Noted   Neuropathy 05/09/2022   Hemorrhoids 05/09/2022   Prediabetes 03/19/2022   Unintentional weight loss 01/30/2022   Murmur 01/30/2022   History of fusion of lumbar spine 09/24/2021   Spinal stenosis of lumbar region 09/24/2021   Depression 08/16/2015   Erectile dysfunction 04/25/2015   Disorder of liver 10/13/2011   HYPERCHOLESTEROLEMIA 03/11/2010   GLAUCOMA 09/03/2009   THYROID NODULE 08/28/2008   BIPOLAR DISORDER UNSPECIFIED 12/13/2007    Past Medical History:  Diagnosis Date   Anxiety    ARM PAIN, LEFT 11/20/2009   Arthritis    BIPOLAR DISORDER UNSPECIFIED 12/13/2007   patient denies   Blood transfusion without reported diagnosis    CHEST PAIN 03/23/2009   currently having    DEPRESSION 12/13/2007   FATIGUE 03/23/2009   GERD (  gastroesophageal reflux disease)    GLAUCOMA 09/03/2009   Headache    HEMORRHOIDS 09/03/2009   HOARSENESS 09/03/2009   HYPERCHOLESTEROLEMIA 03/11/2010   MYALGIA 08/13/2009   NECK PAIN 01/18/2009   Palpitations 03/23/2009   Polydipsia 01/24/2008   Shortness of breath 03/23/2009   THYROID NODULE 08/28/2008   benign on Bx   THYROIDITIS 12/13/2007   Tubular adenoma of colon    Tubular adenoma of colon     Past Surgical History:  Procedure Laterality Date   BACK SURGERY     x 3, lumbar   EYE  SURGERY     2008   FINGER SURGERY     TRANSANAL HEMORRHOIDAL DEARTERIALIZATION N/A 03/07/2016   Procedure: TRANSANAL HEMORRHOIDAL DEARTERIALIZATION HEMORRHOIDAL LIGATION/PEXY EXAM UNDER ANESTHESIA WITH  POSSIBLE HEMORRHOIDECTOMY ;  Surgeon: Michael Boston, MD;  Location: [facility];  Service: General;  Laterality: N/A;    Social History   Socioeconomic History   Marital status: Divorced    Spouse name: Not on file   Number of children: 1   Years of education: Not on file   Highest education level: Not on file  Occupational History   Occupation: Museum/gallery conservator at [employer] Use   Smoking status: Never   Smokeless tobacco: Never  Vaping Use   Vaping Use: Never used  Substance and Sexual Activity   Alcohol use: No    Alcohol/week: 0.0 standard drinks of alcohol   Drug use: No   Sexual activity: Not on file  Other Topics Concern   Not on file  Social History Narrative   Mr Thorngren lives with his wife in [place], 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       He is originally form [place]         Are you right handed or left handed? Right Handed    Are you currently employed ? 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 Yes   Who lives with you? No one    What type of home do you live in: 1 story or 2 story? Lives in a one story home       Social Determinants of Health   Financial Resource Strain: Not on file  Food Insecurity: Not on file  Transportation Needs: Not on file  Physical Activity: Not on file  Stress: Not on file  Social Connections: Not on file  Intimate Partner Violence: Not on file    Family History  Problem Relation Age of Onset   Cancer Father 46       colon   Colon cancer Father    Dementia Mother    Hypertension Other      Review of Systems  Constitutional: Negative.  Negative for chills and fever.  HENT: Negative.  Negative for congestion and sore throat.   Respiratory:  Negative.  Negative for cough and shortness of breath.   Cardiovascular: Negative.  Negative for chest pain and palpitations.  Gastrointestinal:  Negative for abdominal pain, diarrhea, nausea and vomiting.  Genitourinary: Negative.  Negative for dysuria and hematuria.  Skin:  Positive for itching and rash.  Neurological: Negative.  Negative for dizziness and headaches.  All other systems reviewed and are negative.   Today's Vitals   01/12/23 1407  BP: 130/72  Pulse: 80  Temp: 98.6 F (37 C)  TempSrc: Oral  SpO2: 96%  Weight: 164 lb (74.4 kg)  Height: 5\' 5"  (1.651 m)   Body mass index is 27.29 kg/m.   Physical Exam Vitals reviewed.  Constitutional:      Appearance: Normal appearance.  HENT:     Head: Normocephalic.  Eyes:     Extraocular Movements: Extraocular movements intact.  Cardiovascular:     Rate and Rhythm: Normal rate.  Pulmonary:     Effort: Pulmonary effort is normal.  Skin:    General: Skin is warm and dry.     Findings: Rash present.     Comments: Chronic rash to scrotal and perineal areas  Neurological:     General: No focal deficit present.     Mental Status: He is alert and oriented to person, place, and time.  Psychiatric:        Mood and Affect: Mood normal.        Behavior: Behavior normal.      ASSESSMENT & PLAN: A total of 32 minutes was spent with the patient and counseling/coordination of care regarding preparing for this visit, review of most recent office visit notes, review of chronic medical conditions, review of all medications, differential diagnosis of genital rash and need for dermatology evaluation, treatment, prognosis, documentation, and need for follow-up.  Problem List Items Addressed This Visit       Musculoskeletal and Integument   Rash of genital area - Primary    Differential diagnosis discussed. 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      Relevant Medications   clotrimazole-betamethasone (LOTRISONE) cream   Other Relevant Orders   Ambulatory referral to Dermatology   Patient Instructions  Health Maintenance, Male Adopting a healthy lifestyle and getting preventive care are important in promoting health and wellness. Ask your health care provider about: The right schedule for you to have regular tests and exams. Things you can do on your own to prevent diseases and keep yourself healthy. What should I know about diet, weight, and exercise? Eat a healthy diet  Eat a diet that includes plenty of vegetables, fruits, low-fat dairy products, and lean protein. Do not eat a lot of foods that are high in solid fats, added sugars, or sodium. Maintain a healthy weight Body mass index (BMI) is a measurement that can be used to identify possible weight problems. It estimates body fat based on height and weight. Your health care provider can help determine your BMI and help you achieve or maintain a healthy weight. Get regular exercise Get regular exercise. This is one of the most important things you can do for your health. Most adults should: Exercise for at least 150 minutes each week. The exercise should increase your heart rate and make you sweat (moderate-intensity exercise). Do strengthening exercises at least twice a week. This is in addition to the moderate-intensity exercise. Spend less time sitting. Even light physical activity can be beneficial. Watch cholesterol and blood lipids Have your blood tested for lipids and cholesterol at 60 years of age, then have this test every 5 years. You may need to have your cholesterol levels checked more often if: Your lipid or cholesterol levels are high. You are older than 60 years of age. You are at high risk for heart disease. What should I know about cancer screening? Many types of cancers can be detected early and may often be prevented. Depending on your health  history and family history, you may need to have cancer screening at various ages. This may  include screening for: Colorectal cancer. Prostate cancer. Skin cancer. Lung cancer. What should I know about heart disease, diabetes, and high blood pressure? Blood pressure and heart disease High blood pressure causes heart disease and increases the risk of stroke. This is more likely to develop in people who have high blood pressure readings or are overweight. Talk with your health care provider about your target blood pressure readings. Have your blood pressure checked: Every 3-5 years if you are 62-56 years of age. Every year if you are 60 years old or older. If you are between the ages of 45 and 56 and are a current or former smoker, ask your health care provider if you should have a one-time screening for abdominal aortic aneurysm (AAA). Diabetes Have regular diabetes screenings. This checks your fasting blood sugar level. Have the screening done: Once every three years after age 9 if you are at a normal weight and have a low risk for diabetes. More often and at a younger age if you are overweight or have a high risk for diabetes. What should I know about preventing infection? Hepatitis B If you have a higher risk for hepatitis B, you should be screened for this virus. Talk with your health care provider to find out if you are at risk for hepatitis B infection. Hepatitis C Blood testing is recommended for: Everyone born from [birth year] through 1963. Anyone with known risk factors for hepatitis C. Sexually transmitted infections (STIs) You should be screened each year for STIs, including gonorrhea and chlamydia, if: You are sexually active and are younger than 60 years of age. You are older than 60 years of age and your health care provider tells you that you are at risk for this type of infection. Your sexual activity has changed since you were last screened, and you are at increased risk for  chlamydia or gonorrhea. Ask your health care provider if you are at risk. Ask your health care provider about whether you are at high risk for HIV. Your health care provider may recommend a prescription medicine to help prevent HIV infection. If you choose to take medicine to prevent HIV, you should first get tested for HIV. You should then be tested every 3 months for as long as you are taking the medicine. Follow these instructions at home: Alcohol use Do not drink alcohol if your health care provider tells you not to drink. If you drink alcohol: Limit how much you have to 0-2 drinks a day. Know how much alcohol is in your drink. In the [place] one drink equals one 12 oz bottle of beer (355 mL), one 5 oz glass of wine (148 mL), or one 1 oz glass of hard liquor (44 mL). Lifestyle Do not use any products that contain nicotine or tobacco. These products include cigarettes, chewing tobacco, and vaping devices, such as e-cigarettes. If you need help quitting, ask your health care provider. Do not use street drugs. Do not share needles. Ask your health care provider for help if you need support or information about quitting drugs. General instructions Schedule regular health, dental, and eye exams. Stay current with your vaccines. Tell your health care provider if: You often feel depressed. You have ever been abused or do not feel safe at home. Summary Adopting a healthy lifestyle and getting preventive care are important in promoting health and wellness. Follow your health care provider's instructions about healthy diet, exercising, and getting tested or screened for diseases. Follow your health care provider's  instructions on monitoring your cholesterol and blood pressure. This information is not intended to replace advice given to you by your health care provider. Make sure you discuss any questions you have with your health care provider. Document Revised: 03/04/2021 Document Reviewed:  03/04/2021 Elsevier Patient Education  [place], MD [facility]

## 2023-01-12 NOTE — Patient Instructions (Signed)
Health Maintenance, Male Adopting a healthy lifestyle and getting preventive care are important in promoting health and wellness. Ask your health care provider about: The right schedule for you to have regular tests and exams. Things you can do on your own to prevent diseases and keep yourself healthy. What should I know about diet, weight, and exercise? Eat a healthy diet  Eat a diet that includes plenty of vegetables, fruits, low-fat dairy products, and lean protein. Do not eat a lot of foods that are high in solid fats, added sugars, or sodium. Maintain a healthy weight Body mass index (BMI) is a measurement that can be used to identify possible weight problems. It estimates body fat based on height and weight. Your health care provider can help determine your BMI and help you achieve or maintain a healthy weight. Get regular exercise Get regular exercise. This is one of the most important things you can do for your health. Most adults should: Exercise for at least 150 minutes each week. The exercise should increase your heart rate and make you sweat (moderate-intensity exercise). Do strengthening exercises at least twice a week. This is in addition to the moderate-intensity exercise. Spend less time sitting. Even light physical activity can be beneficial. Watch cholesterol and blood lipids Have your blood tested for lipids and cholesterol at 60 years of age, then have this test every 5 years. You may need to have your cholesterol levels checked more often if: Your lipid or cholesterol levels are high. You are older than 60 years of age. You are at high risk for heart disease. What should I know about cancer screening? Many types of cancers can be detected early and may often be prevented. Depending on your health history and family history, you may need to have cancer screening at various ages. This may include screening for: Colorectal cancer. Prostate cancer. Skin cancer. Lung  cancer. What should I know about heart disease, diabetes, and high blood pressure? Blood pressure and heart disease High blood pressure causes heart disease and increases the risk of stroke. This is more likely to develop in people who have high blood pressure readings or are overweight. Talk with your health care provider about your target blood pressure readings. Have your blood pressure checked: Every 3-5 years if you are 18-39 years of age. Every year if you are 40 years old or older. If you are between the ages of 65 and 75 and are a current or former smoker, ask your health care provider if you should have a one-time screening for abdominal aortic aneurysm (AAA). Diabetes Have regular diabetes screenings. This checks your fasting blood sugar level. Have the screening done: Once every three years after age 45 if you are at a normal weight and have a low risk for diabetes. More often and at a younger age if you are overweight or have a high risk for diabetes. What should I know about preventing infection? Hepatitis B If you have a higher risk for hepatitis B, you should be screened for this virus. Talk with your health care provider to find out if you are at risk for hepatitis B infection. Hepatitis C Blood testing is recommended for: Everyone born from 1945 through 1965. Anyone with known risk factors for hepatitis C. Sexually transmitted infections (STIs) You should be screened each year for STIs, including gonorrhea and chlamydia, if: You are sexually active and are younger than 60 years of age. You are older than 60 years of age and your   health care provider tells you that you are at risk for this type of infection. Your sexual activity has changed since you were last screened, and you are at increased risk for chlamydia or gonorrhea. Ask your health care provider if you are at risk. Ask your health care provider about whether you are at high risk for HIV. Your health care provider  may recommend a prescription medicine to help prevent HIV infection. If you choose to take medicine to prevent HIV, you should first get tested for HIV. You should then be tested every 3 months for as long as you are taking the medicine. Follow these instructions at home: Alcohol use Do not drink alcohol if your health care provider tells you not to drink. If you drink alcohol: Limit how much you have to 0-2 drinks a day. Know how much alcohol is in your drink. In the [place] one drink equals one 12 oz bottle of beer (355 mL), one 5 oz glass of wine (148 mL), or one 1 oz glass of hard liquor (44 mL). Lifestyle Do not use any products that contain nicotine or tobacco. These products include cigarettes, chewing tobacco, and vaping devices, such as e-cigarettes. If you need help quitting, ask your health care provider. Do not use street drugs. Do not share needles. Ask your health care provider for help if you need support or information about quitting drugs. General instructions Schedule regular health, dental, and eye exams. Stay current with your vaccines. Tell your health care provider if: You often feel depressed. You have ever been abused or do not feel safe at home. Summary Adopting a healthy lifestyle and getting preventive care are important in promoting health and wellness. Follow your health care provider's instructions about healthy diet, exercising, and getting tested or screened for diseases. Follow your health care provider's instructions on monitoring your cholesterol and blood pressure. This information is not intended to replace advice given to you by your health care provider. Make sure you discuss any questions you have with your health care provider. Document Revised: 03/04/2021 Document Reviewed: 03/04/2021 Elsevier Patient Education  2023 Elsevier Inc.  

## 2023-01-12 NOTE — Assessment & Plan Note (Signed)
Differential diagnosis discussed. Most likely chronic fungal rash Partially responsive to Lotrisone cream Recommend dermatology evaluation Referral placed today.

## 2023-02-04 ENCOUNTER — Other Ambulatory Visit: Payer: Self-pay | Admitting: Internal Medicine

## 2023-02-06 ENCOUNTER — Ambulatory Visit: Payer: Managed Care, Other (non HMO) | Admitting: Neurology

## 2023-02-06 DIAGNOSIS — M5416 Radiculopathy, lumbar region: Secondary | ICD-10-CM

## 2023-02-06 DIAGNOSIS — M79672 Pain in left foot: Secondary | ICD-10-CM

## 2023-02-06 DIAGNOSIS — M79671 Pain in right foot: Secondary | ICD-10-CM | POA: Diagnosis not present

## 2023-02-06 NOTE — Procedures (Signed)
[facility]  [address] [place], [address]  [place], [place] [address] Tel: [phone] Fax: [phone] Test Date:  02/06/2023  Patient: [name] DOB: [birth year] Physician: Nita Sickle, DO  Sex: Male Height: 5\' 5"  Ref Phys: Nita Sickle, DO  ID#: [id number]   Technician:    History: This is a 62 year old man referred for evaluation of bilateral feet paresthesias.  NCV & EMG Findings: Extensive electrodiagnostic testing of the right lower extremity and additional studies of the left shows:  Bilateral sural and superficial peroneal sensory responses are within normal limits. Bilateral peroneal and tibial motor responses are within normal limits. Bilateral tibial H reflex studies are within normal limits.   Chronic motor axon loss changes are seen affecting the L5 myotomes bilaterally, without accompanying active denervation.  Impression: Chronic L5 radiculopathy affecting bilateral lower extremities, moderate. There is no evidence of a large fiber sensorimotor polyneuropathy affecting the lower extremities.   ___________________________ Nita Sickle, DO    Nerve Conduction Studies   Stim Site NR Peak (ms) Norm Peak (ms) O-P Amp (V) Norm O-P Amp  Left Sup Peroneal Anti Sensory (Ant Lat Mall)  32 C  12 cm    2.4 <4.6 7.4 >4  Right Sup Peroneal Anti Sensory (Ant Lat Mall)  32 C  12 cm    2.5 <4.6 8.1 >4  Left Sural Anti Sensory (Lat Mall)  32 C  Calf    2.7 <4.6 10.0 >4  Right Sural Anti Sensory (Lat Mall)  32 C  Calf    3.1 <4.6 9.2 >4     Stim Site NR Onset (ms) Norm Onset (ms) O-P Amp (mV) Norm O-P Amp Site1 Site2 Delta-0 (ms) Dist (cm) Vel (m/s) Norm Vel (m/s)  Left Peroneal Motor (Ext Dig Brev)  32 C  Ankle    3.1 <6.0 4.3 >2.5 B Fib Ankle 8.1 36.0 44 >40  B Fib    11.2  3.7  Poplt B Fib 1.5 8.0 53 >40  Poplt    12.7  3.5         Right Peroneal Motor (Ext Dig Brev)  32 C  Ankle    3.1 <6.0 4.6 >2.5 B Fib Ankle 8.6 36.0 42 >40  B Fib    11.7   3.9  Poplt B Fib 1.5 8.0 53 >40  Poplt    13.2  3.6         Left Tibial Motor (Abd Hall Brev)  32 C  Ankle    3.8 <6.0 5.4 >4 Knee Ankle 10.0 42.0 42 >40  Knee    13.8  5.0         Right Tibial Motor (Abd Hall Brev)  32 C  Ankle    3.9 <6.0 5.2 >4 Knee Ankle 9.6 41.0 43 >40  Knee    13.5  4.4          Electromyography   Side Muscle Ins.Act Fibs Fasc Recrt Amp Dur Poly Activation Comment  Right AntTibialis Nml Nml Nml *1- *1+ *1+ *1+ Nml N/A  Right Gastroc Nml Nml Nml Nml Nml Nml Nml Nml N/A  Right Flex Dig Long Nml Nml Nml *1- *1+ *1+ *1+ Nml N/A  Right RectFemoris Nml Nml Nml Nml Nml Nml Nml Nml N/A  Right BicepsFemS Nml Nml Nml Nml Nml Nml Nml Nml N/A  Right GluteusMed Nml Nml Nml *1- *1+ *1+ *1+ Nml N/A  Left AntTibialis Nml Nml Nml *1- *1+ *1+ *1+ Nml N/A  Left Gastroc  Nml Nml Nml Nml Nml Nml Nml Nml N/A  Left Flex Dig Long Nml Nml Nml *1- *1+ *1+ *1+ Nml N/A  Left RectFemoris Nml Nml Nml Nml Nml Nml Nml Nml N/A  Left GluteusMed Nml Nml Nml *1- *1+ *1+ *1+ Nml N/A      Waveforms:

## 2023-02-12 ENCOUNTER — Encounter: Payer: Self-pay | Admitting: Internal Medicine

## 2023-02-12 ENCOUNTER — Other Ambulatory Visit: Payer: Self-pay | Admitting: Internal Medicine

## 2023-02-13 ENCOUNTER — Other Ambulatory Visit: Payer: Self-pay | Admitting: Internal Medicine

## 2023-02-16 ENCOUNTER — Ambulatory Visit: Payer: Managed Care, Other (non HMO) | Admitting: Neurology

## 2023-02-16 NOTE — Telephone Encounter (Signed)
Certainly okay for Motegrity 2 mg daily

## 2023-02-17 ENCOUNTER — Encounter: Payer: Self-pay | Admitting: Emergency Medicine

## 2023-02-19 ENCOUNTER — Ambulatory Visit: Payer: Managed Care, Other (non HMO) | Admitting: Dermatology

## 2023-02-19 VITALS — BP 108/71

## 2023-02-19 DIAGNOSIS — R21 Rash and other nonspecific skin eruption: Secondary | ICD-10-CM

## 2023-02-19 DIAGNOSIS — B356 Tinea cruris: Secondary | ICD-10-CM | POA: Diagnosis not present

## 2023-02-19 DIAGNOSIS — B353 Tinea pedis: Secondary | ICD-10-CM | POA: Diagnosis not present

## 2023-02-19 MED ORDER — GRISEOFULVIN ULTRAMICROSIZE 250 MG PO TABS: 250.0000 mg | ORAL_TABLET | Freq: Every day | ORAL | 0 refills | Status: AC

## 2023-02-19 MED ORDER — CLOTRIMAZOLE-BETAMETHASONE 1-0.05 % EX CREA: 1.0000 | TOPICAL_CREAM | Freq: Two times a day (BID) | CUTANEOUS | 0 refills | Status: DC

## 2023-02-19 MED ORDER — NYSTATIN-TRIAMCINOLONE 100000-0.1 UNIT/GM-% EX OINT: 1.0000 | TOPICAL_OINTMENT | Freq: Two times a day (BID) | CUTANEOUS | 0 refills | Status: DC

## 2023-02-19 NOTE — Progress Notes (Signed)
   New Patient Visit   Subjective  [name] is a 62 y.o. male who presents for the following: Rash of groin that started this month is getting worse. He has used Lotrisone cream and it did not help. [facility] prescribed Cephalexin and he is finishing it now. It is itchy and has some bumps. The itch has been going on for about a year.   The following portions of the chart were reviewed this encounter and updated as appropriate: medications, allergies, medical history  Review of Systems:  No other skin or systemic complaints except as noted in HPI or Assessment and Plan.  Objective  Well appearing patient in no apparent distress; mood and affect are within normal limits.    A focused examination was performed of the following areas: Groin area, buttocks, feet   Relevant exam findings are noted in the Assessment and Plan.    Assessment & Plan   TINEA PEDIS and UNGUIUM Exam: Scaling and maceration web spaces and over distal and lateral soles. Toenail dystrophy.  Patient Education and Treatment Plan: Risk benefits of treatments discussed with patient. Patient consents to use medications as ordered. Clotrimazole-Betamethasone cream bid to feet. We will plan to address nail involvment on follow up (will start Jublia or Kerydin). Recommend Clotrimazole-Betamethasone cream bid to feet. cream BID x 4 wks, Zeasorb AF powder and tips for prevention and maintenance, which includes changing shoes and keeping feet dry.      TINEA CRURIS Exam: violacious plaques involving BL inguinal creases and irritation on scrotum  Treatment Plan: Nystatin-Triamcinolone ointment bid to groin area x 1 week Griseofulvin 250 mg 1 po qd x 1 month     Return in about 1 month (around 03/21/2023) for Follow up Tinea.  I, Joanie Coddington, CMA, am acting as scribe for Langston Reusing, MD .   Documentation: I have reviewed the above documentation for accuracy and completeness, and I agree with the  above.  Langston Reusing, MD

## 2023-02-19 NOTE — Patient Instructions (Signed)
Due to recent changes in healthcare laws, you may see results of your pathology and/or laboratory studies on MyChart before the doctors have had a chance to review them. We understand that in some cases there may be results that are confusing or concerning to you. Please understand that not all results are received at the same time and often the doctors may need to interpret multiple results in order to provide you with the best plan of care or course of treatment. Therefore, we ask that you please give us 2 business days to thoroughly review all your results before contacting the office for clarification. Should we see a critical lab result, you will be contacted sooner.   If You Need Anything After Your Visit  If you have any questions or concerns for your doctor, please call our main line at [phone] If no one answers, please leave a voicemail as directed and we will return your call as soon as possible. Messages left after 4 pm will be answered the following business day.   You may also send us a message via MyChart. We typically respond to MyChart messages within 1-2 business days.  For prescription refills, please ask your pharmacy to contact our office. Our fax number is [phone].  If you have an urgent issue when the clinic is closed that cannot wait until the next business day, you can page your doctor at the number below.    Please note that while we do our best to be available for urgent issues outside of office hours, we are not available 24/7.   If you have an urgent issue and are unable to reach us, you may choose to seek medical care at your doctor's office, retail clinic, urgent care center, or emergency room.  If you have a medical emergency, please immediately call 911 or go to the emergency department. In the event of inclement weather, please call our main line at [phone] for an update on the status of any delays or closures.  Dermatology Medication Tips: Please  keep the boxes that topical medications come in in order to help keep track of the instructions about where and how to use these. Pharmacies typically print the medication instructions only on the boxes and not directly on the medication tubes.   If your medication is too expensive, please contact our office at [phone] or send us a message through MyChart.   We are unable to tell what your co-pay for medications will be in advance as this is different depending on your insurance coverage. However, we may be able to find a substitute medication at lower cost or fill out paperwork to get insurance to cover a needed medication.   If a prior authorization is required to get your medication covered by your insurance company, please allow us 1-2 business days to complete this process.  Drug prices often vary depending on where the prescription is filled and some pharmacies may offer cheaper prices.  The website www.goodrx.com contains coupons for medications through different pharmacies. The prices here do not account for what the cost may be with help from insurance (it may be cheaper with your insurance), but the website can give you the price if you did not use any insurance.  - You can print the associated coupon and take it with your prescription to the pharmacy.  - You may also stop by our office during regular business hours and pick up a GoodRx coupon card.  - If you need your   prescription sent electronically to a different pharmacy, notify our office through [facility] MyChart or by phone at [phone]     

## 2023-02-23 ENCOUNTER — Encounter: Payer: Self-pay | Admitting: Dermatology

## 2023-03-05 ENCOUNTER — Encounter: Payer: Self-pay | Admitting: Internal Medicine

## 2023-03-05 ENCOUNTER — Ambulatory Visit: Payer: Managed Care, Other (non HMO) | Admitting: Internal Medicine

## 2023-03-05 VITALS — BP 128/80 | HR 74 | Ht 65.0 in | Wt 164.6 lb

## 2023-03-05 DIAGNOSIS — R7303 Prediabetes: Secondary | ICD-10-CM

## 2023-03-05 DIAGNOSIS — E041 Nontoxic single thyroid nodule: Secondary | ICD-10-CM | POA: Diagnosis not present

## 2023-03-05 LAB — TSH: TSH: 1.63 u[IU]/mL (ref 0.35–5.50)

## 2023-03-05 NOTE — Progress Notes (Addendum)
Patient ID: [name], male   DOB: May 26, 1961, 62 y.o.   MRN: [id number]  HPI  [name] is a 62 y.o.-year-old male, returning for follow-up for a right thyroid nodule.  He previously saw Dr. Everardo Ingram, last visit with me 60 year ago.  Interim history: At today's visit still complains of feeling of throat when swallowing.  No dysphagia. No tremors, palpitations, or other new symptoms.  Reviewed history: Patient has a history of stable thyroid nodules, initially diagnosed in 2008.  On 02/04/2022, he presented to the emergency room with tinnitus.  An x-ray and a CT scan of the temporal area were normal.  A TSH was also normal.  He saw ENT for tinnitus and hear loss >> ears irrigated >> hearing loss resolved.  At our first visit from 02/2022, he described the feeling of something being stuck in his throat.  No dysphagia or choking.  He will see another ENT in 2 weeks for this problem.  Reviewed previous investigation: Thyroid U/S (05/05/2007): The right lobe is 5.2 x 1.6 x 2.0 cm and the left lobe is 5.3 x 1.2 x 2.1 cm.  The isthmus is 4 mm in thickness.  There is a 1.1 x 0.7 x 0.8 cm solid nodule in the lower pole of the right lobe medially.  Otherwise, no nodules are seen.    IMPRESSION:  Solitary 1.1 cm nodule in the right lobe. Biopsy can be performed as clinically indicated.     Thyroid FNA (05/18/2007):  THYROID, RIGHT, FINE NEEDLE ASPIRATION (SMEARS, THIN PREP, CELL   BLOCK): CONSISTENT WITH HYPERPLASTIC NODULE, SEE COMMENT.    COMMENT   The specimen shows abundant colloid and a few groups of   follicular epithelial cells. Overall, the cytologic features   favor a benign process. (JK:mw 05/19/07)  ... Thyroid U/S (04/14/2011): Right thyroid lobe:  Stable 6.3 cm long X 1.8 cm AP X 2.2 cm wide.  Left thyroid lobe:  Stable 5.6 cm long X 1.9 cm AP X 2.3 cm wide.  Isthmus:  Stable 5 mm AP thickness.   Focal nodules:  Stable homogeneous thyroid echotexture.  Stable dominant  right inferior thyroid isoechoic solid thyroid  nodule measuring 1.6 cm long X 0.9 cm AP X 1.5 cm wide, 5 mm left isthmus and inferior left lobe is 6 mm solid nodules.   No new nodules.   Lymphadenopathy:  None visualized.   IMPRESSION:  Stable thyroid nodules with no new significant abnormality.  ... Thyroid U/S (03/15/2018): Parenchymal Echotexture: Normal  Isthmus: 0.3 cm  Right lobe: 5.0 x 1.8 x 2.5 cm  Left lobe: 4.9 x 1.8 x 2.0 cm _________________________________________________________   Questionable pseudo nodule in the right inferior gland is isoechoic to the surrounding thyroid tissue. This region measures 1.4 x 0.8 x 1.6 cm which is similar to slightly smaller than 1.6 x 0.9 x 1.5 cm as measured in June of 2012. Greater than 5 year stability is consistent with benignity.   IMPRESSION: Confirmed greater than 5 year stability of the nodule versus pseudo nodule in the right inferior gland consistent with a benign process. No further imaging required.     I reviewed pt's thyroid tests: Lab Results  Component Value Date   TSH 3.805 02/04/2022   TSH 2.07 01/30/2022   TSH 1.25 11/04/2021   TSH 1.270 08/03/2020   TSH 1.930 07/18/2019   TSH 1.14 07/12/2018   TSH 2.16 06/15/2017   TSH 2.48 05/19/2016   TSH 2.84 12/03/2015   TSH  4.08 04/25/2015   FREET4 0.8 12/13/2007    He was previously found to have thyroiditis changes on the thyroid uptake and scan (03/01/2003): 24 HOUR UPTAKE WAS PERFORMED AFTER THE PATIENT WAS GIVEN A CAPSULE CONTAINING 14.3 uCi I131.   THE 24 HOUR UPTAKE IS CALCULATED TO BE 0.2%, WITH A SECOND CALCULATION OF 0.6%.  THYROID SCANNING WAS PERFORMED AFTER THE IV INJECTION OF 11.2 mCi 66mTc04.  THERE IS MINIMAL UPTAKE IN THE GLAND.   No FH of thyroid ds. No FH of thyroid cancer. No h/o radiation tx to head or neck.  Prediabetes: He also mentions that sometimes his blood sugars drop too low and he is feeling shaky.  However, he is not checking  sugars during these episodes.  Reviewed HbA1c levels: Lab Results  Component Value Date   HGBA1C 6.2 08/25/2022   HGBA1C 6.2 05/09/2022   HGBA1C 6.4 01/30/2022   HGBA1C 6.3 08/19/2021   ROS: + See HPI   Past Medical History:  Diagnosis Date   Anxiety    ARM PAIN, LEFT 11/20/2009   Arthritis    BIPOLAR DISORDER UNSPECIFIED 12/13/2007   patient denies   Blood transfusion without reported diagnosis    CHEST PAIN 03/23/2009   currently having    DEPRESSION 12/13/2007   FATIGUE 03/23/2009   GERD (gastroesophageal reflux disease)    GLAUCOMA 09/03/2009   Headache    HEMORRHOIDS 09/03/2009   HOARSENESS 09/03/2009   HYPERCHOLESTEROLEMIA 03/11/2010   MYALGIA 08/13/2009   NECK PAIN 01/18/2009   Palpitations 03/23/2009   Polydipsia 01/24/2008   Shortness of breath 03/23/2009   THYROID NODULE 08/28/2008   benign on Bx   THYROIDITIS 12/13/2007   Tubular adenoma of colon    Tubular adenoma of colon    Past Surgical History:  Procedure Laterality Date   BACK SURGERY     x 3, lumbar   EYE SURGERY     2008   FINGER SURGERY     TRANSANAL HEMORRHOIDAL DEARTERIALIZATION N/A 03/07/2016   Procedure: TRANSANAL HEMORRHOIDAL DEARTERIALIZATION HEMORRHOIDAL LIGATION/PEXY EXAM UNDER ANESTHESIA WITH  POSSIBLE HEMORRHOIDECTOMY ;  Surgeon: Karie Soda, MD;  Location: [facility];  Service: General;  Laterality: N/A;   Social History   Socioeconomic History   Marital status: Divorced    Spouse name: Not on file   Number of children: 1   Years of education: Not on file   Highest education level: Not on file  Occupational History   Occupation: Merchandiser, retail at [employer]  Tobacco Use   Smoking status: Never   Smokeless tobacco: Never  Vaping Use   Vaping Use: Never used  Substance and Sexual Activity   Alcohol use: No    Alcohol/week: 0.0 standard drinks of alcohol   Drug use: No   Sexual activity: Not on file  Other Topics Concern   Not on file  Social History Narrative   [name]  lives with his wife in [place], 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       He is originally form [place]         Are you right handed or left handed? Right Handed    Are you currently employed ? 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 Yes   Who lives with you? No one    What type of home do you live in: 1 story or 2 story? Lives in a one  story home       Social Determinants of Health   Financial Resource Strain: Not on file  Food Insecurity: Not on file  Transportation Needs: Not on file  Physical Activity: Not on file  Stress: Not on file  Social Connections: Not on file  Intimate Partner Violence: Not on file   Current Outpatient Medications on File Prior to Visit  Medication Sig Dispense Refill   atorvastatin (LIPITOR) 20 MG tablet TAKE 1 TABLET BY MOUTH DAILY 90 tablet 2   clindamycin-benzoyl peroxide (BENZACLIN) gel Apply topically 2 (two) times daily. 25 g 0   clotrimazole-betamethasone (LOTRISONE) cream Apply 1 Application topically 2 (two) times daily. Apply twice daily to feet 30 g 0   diclofenac Sodium (VOLTAREN) 1 % GEL Apply 2 g topically 4 (four) times daily. 100 g 5   escitalopram (LEXAPRO) 10 MG tablet Take 10 mg by mouth daily.     fluticasone (FLONASE) 50 MCG/ACT nasal spray Place 1 spray into both nostrils 2 (two) times daily. 16 g 6   gabapentin (NEURONTIN) 300 MG capsule Take 1 tablet x 1 week, then increase 2 tablet at bedtime. 60 capsule 3   griseofulvin (GRIS-PEG) 250 MG tablet Take 1 tablet (250 mg total) by mouth daily. 30 tablet 0   ibuprofen (ADVIL) 600 MG tablet Take 1 tablet (600 mg total) by mouth every 8 (eight) hours as needed. 30 tablet 0   methocarbamol (ROBAXIN) 500 MG tablet TAKE ONE TABLET BY MOUTH EVERY MORNING AND AFTERNOON. TAKE TWO TABLETS BY MOUTH EVERY NIGHT AT BEDTIME AS NEEDED FOR MUSCLE RELAXATION 40 tablet 0   mirtazapine (REMERON) 45 MG tablet       MOTEGRITY 2 MG TABS TAKE 1 TABLET BY MOUTH DAILY 30 tablet 2   Multiple Vitamin (MULTIVITAMIN) tablet Take 1 tablet by mouth daily.     nystatin-triamcinolone ointment (MYCOLOG) Apply 1 Application topically 2 (two) times daily. Apply to groin area twice daily x 1 week 30 g 0   omeprazole (PRILOSEC) 40 MG capsule Take 1 capsule (40 mg total) by mouth daily. 90 capsule 1   Plecanatide (TRULANCE) 3 MG TABS Take 1 tablet (3 mg total) by mouth daily. 30 tablet 3   potassium chloride (KLOR-CON M) 10 MEQ tablet Take 1 tablet (10 mEq total) by mouth daily. 30 tablet 2   sildenafil (VIAGRA) 100 MG tablet Take 0.5-1 tablets (50-100 mg total) by mouth daily as needed for erectile dysfunction. 90 each 3   Travoprost (TRAVATAN OP) Apply to eye at bedtime.     Travoprost, BAK Free, (TRAVATAN) 0.004 % SOLN ophthalmic solution      No current facility-administered medications on file prior to visit.   No Known Allergies Family History  Problem Relation Age of Onset   Cancer Father 45       colon   Colon cancer Father    Dementia Mother    Hypertension Other    PE: BP 128/80 (BP Location: Left Arm, Patient Position: Sitting, Cuff Size: Normal)   Pulse 74   Ht 5\' 5"  (1.651 m)   Wt 164 lb 9.6 oz (74.7 kg)   SpO2 93%   BMI 27.39 kg/m  Wt Readings from Last 3 Encounters:  03/05/23 164 lb 9.6 oz (74.7 kg)  01/12/23 164 lb (74.4 kg)  12/01/22 160 lb (72.6 kg)   Constitutional: overweight, in NAD Eyes:  EOMI, no exophthalmos ENT: no neck masses, no cervical lymphadenopathy Cardiovascular: RRR, No MRG Respiratory: CTA B Musculoskeletal: no  deformities Skin:no rashes Neurological: no tremor with outstretched hands  ASSESSMENT: 1. R Thyroid nodule  2. Prediabetes  PLAN: 1. Thyroid nodule -Patient with a history of a 1.4 cm right thyroid nodule without concerning features:  - hypoechogenicity - presence of microcalcifications - presence of internal blood flow - taller-than-wide  distribution - irregular contours -He does not have a thyroid cancer history or personal history of radiation to head or neck to increase his risk of thyroid cancer -The nodule appears to be approximately stable over many years, from 2008-2019.  In the 7 years between 2012 and 2019, the nodule appears to be slightly smaller in size.  On the latest ultrasound from 2019, the nodule appeared similar to a pseudonodule, or inflammatory nodule, therefore, benign -Moreover, this nodule was biopsied in the past with benign results -He previously described a foreign body sensation in his neck but we discussed that this was unlikely to be related to the nodule since the nodule was quite small and did not appear to be compressing on any structure.  The nodule was found in the right thyroid lobe, and not on the left, to be more likely to compress his esophagus.  He had an x-ray of his neck before last visit and this was normal.    -At today's visit, I do not feel neck masses on palpation of his neck, however, he is worried about the nodule and he has a foreign body sensation in his throat when he swallows.  We discussed about obtaining another ultrasound now, 5 years from the previous, however, if the nodule appears to be stable, no further investigation is needed and he does not need endocrinology follow-up.  He can have a TSH obtained by PCP at the time of his annual physical exam. -At today's visit, since he did not have a TSH since 02/04/2022 (this was normal), I will recheck this. B complex with biotin, but not quite every day.  Last dose yesterday.  2.  Prediabetes -We reviewed his most recent HbA1c level, which was stable from before, at 6.2%.  We discussed that this is in the prediabetic range. -At last visit I recommended to reduce the glycemic index improved, eating healthy fats with meals and not drinking within 30 minutes from a meal as he was mentioning occasional hypoglycemic episodes.  He was not  checking blood sugars at the time of the episode and I did advise him to start doing so.  He is still not checking blood sugars now.  We discussed about obtaining a glucometer from [employer] ([employer]) checking whenever he feels hypoglycemic. -He mentions he is able to correct the hypoglycemic episodes quick with regular soda -Will continue to follow-up with PCP for this problem  Orders Placed This Encounter  Procedures   US THYROID   TSH   Component     Latest Ref Rng 03/05/2023  TSH     0.35 - 5.50 uIU/mL 1.63   Normal TSH.  Carlus Pavlov, MD PhD [facility]

## 2023-03-05 NOTE — Patient Instructions (Addendum)
Please return to see me as needed.  Please have your PCP check your TSH annually.  Let's check a new thyroid U/S.

## 2023-03-09 ENCOUNTER — Encounter: Payer: Self-pay | Admitting: Dermatology

## 2023-03-09 ENCOUNTER — Ambulatory Visit: Payer: Managed Care, Other (non HMO) | Admitting: Neurology

## 2023-03-10 ENCOUNTER — Other Ambulatory Visit: Payer: Self-pay

## 2023-03-10 MED ORDER — CLOTRIMAZOLE-BETAMETHASONE 1-0.05 % EX CREA: 1.0000 | TOPICAL_CREAM | Freq: Two times a day (BID) | CUTANEOUS | 0 refills | Status: DC

## 2023-03-10 NOTE — Telephone Encounter (Signed)
We saw this pt together in April.  Can you send 1 refill for the Clotrimazole/Betamethasone cream for his feet.   Thanks!  -Dr. Algis Downs

## 2023-03-30 ENCOUNTER — Encounter: Payer: Self-pay | Admitting: Dermatology

## 2023-03-30 ENCOUNTER — Other Ambulatory Visit: Payer: Self-pay | Admitting: Emergency Medicine

## 2023-03-30 ENCOUNTER — Ambulatory Visit: Payer: Managed Care, Other (non HMO) | Admitting: Dermatology

## 2023-03-30 VITALS — BP 115/77 | HR 72

## 2023-03-30 DIAGNOSIS — B353 Tinea pedis: Secondary | ICD-10-CM

## 2023-03-30 DIAGNOSIS — B356 Tinea cruris: Secondary | ICD-10-CM | POA: Diagnosis not present

## 2023-03-30 DIAGNOSIS — K219 Gastro-esophageal reflux disease without esophagitis: Secondary | ICD-10-CM

## 2023-03-30 DIAGNOSIS — B351 Tinea unguium: Secondary | ICD-10-CM

## 2023-03-30 MED ORDER — JUBLIA 10 % EX SOLN: CUTANEOUS | 11 refills | Status: DC

## 2023-03-30 MED ORDER — NYSTATIN-TRIAMCINOLONE 100000-0.1 UNIT/GM-% EX OINT: TOPICAL_OINTMENT | CUTANEOUS | 2 refills | Status: DC

## 2023-03-30 MED ORDER — CLOTRIMAZOLE-BETAMETHASONE 1-0.05 % EX CREA: TOPICAL_CREAM | CUTANEOUS | 2 refills | Status: AC

## 2023-03-30 MED ORDER — TACROLIMUS 0.1 % EX OINT: TOPICAL_OINTMENT | Freq: Two times a day (BID) | CUTANEOUS | 2 refills | Status: DC

## 2023-03-30 NOTE — Progress Notes (Signed)
   Follow Up Visit   Subjective  [name] is a 62 y.o. male who presents for the following: Rash 1 month follow up. Groin, feet, toenails, reports rash is getting better but still has itching. Finished Grifulvin as directed. States he is out of the creams.  Clotrimazole/Betamethasone for feet and Nystatin-Triamcinolone ointment bid for groin area  Has been seen by neurologist since last visit. States negative for neuropathy in feet. Still has burning at soles of feet.   He has used Lotrisone cream and it did not help. [facility] prescribed Cephalexin and he is finishing it now. It is itchy and has some bumps. The itch has been going on for about a year.   The following portions of the chart were reviewed this encounter and updated as appropriate: medications, allergies, medical history  Review of Systems:  No other skin or systemic complaints except as noted in HPI or Assessment and Plan.  Objective  Well appearing patient in no apparent distress; mood and affect are within normal limits.    A focused examination was performed of the following areas: Groin area, buttocks, feet   Relevant exam findings are noted in the Assessment and Plan.    Assessment & Plan   TINEA PEDIS (improved) Exam: Mild scaling at feet.   Patient Education and Treatment Plan:   Use Clotrimazole-Betamethasone cream bid to feet. cream BID x 4 wks as needed for flares.  Use Zeasorb AF powder daily for maintenance for feet and groin area.  Change shoes and keep feet dry.   ONYCHOMYCOSIS (uncontrolled) Exam: Thickened toenails with subungal debris c/w onychomycosis  Chronic and persistent condition with duration or expected duration over one year. Condition is symptomatic/ bothersome to patient. Not currently at goal.  Treatment Plan: Start Jublia every night at bedtime to toenails.     TINEA CRURIS (Resolved) Exam: mild hyperpigmentation  Treatment Plan:  Stop  Nystatin-Triamcinolone ointment.  Repeat as needed for flares.  Pruritus  Exam: c/o occasional pruritus in groin (not associated with the tinea cruris)  Treatment plan: Start Tacrolimus ointment twice daily to groin, buttocks, scrotal area until itching has resolved.    Return in about 4 months (around 07/30/2023) for Rash Follow Up.  I, Lawson Radar, CMA, am acting as scribe for [employer], DO.    Documentation: I have reviewed the above documentation for accuracy and completeness, and I agree with the above.  Langston Reusing, DO

## 2023-03-30 NOTE — Patient Instructions (Addendum)
Start Jublia every night at bedtime to toenails.     Your prescription was sent to [facility] in [place]. A representative from [employer] will contact you within 2 business hours to verify your address and insurance information to schedule a free delivery. If for any reason you do not receive a phone call from them, please reach out to them. Their phone number is [phone] and their hours are Monday-Friday 9:00 am-5:00 pm.     Start Tacrolimus ointment twice daily to groin, buttocks, scrotal area until itching has resolved.    Use Zeasorb AF powder daily for maintenance to feet and groin area.  Change shoes and keep feet dry.      If condition flares again:  Use Nystatin-Triamcinolone ointment twice daily to groin area x 1-2 weeks as needed for flares of rash/itching in groin.  Use Clotrimazole-Betamethasone cream bid to feet. cream BID x 4 wks as needed for flares.     Due to recent changes in healthcare laws, you may see results of your pathology and/or laboratory studies on MyChart before the doctors have had a chance to review them. We understand that in some cases there may be results that are confusing or concerning to you. Please understand that not all results are received at the same time and often the doctors may need to interpret multiple results in order to provide you with the best plan of care or course of treatment. Therefore, we ask that you please give [place] 2 business days to thoroughly review all your results before contacting the office for clarification. Should we see a critical lab result, you will be contacted sooner.   If You Need Anything After Your Visit  If you have any questions or concerns for your doctor, please call our main line at [phone] If no one answers, please leave a voicemail as directed and we will return your call as soon as possible. Messages left after 4 pm will be answered the following business day.   You may also send [place]  a message via MyChart. We typically respond to MyChart messages within 1-2 business days.  For prescription refills, please ask your pharmacy to contact our office. Our fax number is [phone].  If you have an urgent issue when the clinic is closed that cannot wait until the next business day, you can page your doctor at the number below.    Please note that while we do our best to be available for urgent issues outside of office hours, we are not available 24/7.   If you have an urgent issue and are unable to reach [place], you may choose to seek medical care at your doctor's office, retail clinic, urgent care center, or emergency room.  If you have a medical emergency, please immediately call 911 or go to the emergency department. In the event of inclement weather, please call our main line at [phone] for an update on the status of any delays or closures.  Dermatology Medication Tips: Please keep the boxes that topical medications come in in order to help keep track of the instructions about where and how to use these. Pharmacies typically print the medication instructions only on the boxes and not directly on the medication tubes.   If your medication is too expensive, please contact our office at [phone] or send [place] a message through MyChart.   We are unable to tell what your co-pay for medications will be in advance as this is different depending on your  insurance coverage. However, we may be able to find a substitute medication at lower cost or fill out paperwork to get insurance to cover a needed medication.   If a prior authorization is required to get your medication covered by your insurance company, please allow [place] 1-2 business days to complete this process.  Drug prices often vary depending on where the prescription is filled and some pharmacies may offer cheaper prices.  The website www.goodrx.com contains coupons for medications through different pharmacies. The prices  here do not account for what the cost may be with help from insurance (it may be cheaper with your insurance), but the website can give you the price if you did not use any insurance.  - You can print the associated coupon and take it with your prescription to the pharmacy.  - You may also stop by our office during regular business hours and pick up a GoodRx coupon card.  - If you need your prescription sent electronically to a different pharmacy, notify our office through [facility] or by phone at [phone]

## 2023-03-31 ENCOUNTER — Encounter: Payer: Self-pay | Admitting: Dermatology

## 2023-04-03 ENCOUNTER — Ambulatory Visit
Admission: RE | Admit: 2023-04-03 | Discharge: 2023-04-03 | Disposition: A | Discharge status: Home / Self Care | Payer: Managed Care, Other (non HMO) | Source: Ambulatory Visit | Attending: Internal Medicine | Admitting: Internal Medicine

## 2023-04-03 DIAGNOSIS — E041 Nontoxic single thyroid nodule: Secondary | ICD-10-CM

## 2023-04-13 ENCOUNTER — Ambulatory Visit: Payer: Managed Care, Other (non HMO) | Admitting: Internal Medicine

## 2023-04-28 ENCOUNTER — Telehealth: Payer: Self-pay

## 2023-04-28 NOTE — Telephone Encounter (Signed)
*  Gastro  PA request received for Motegrity 2MG  tablets  PA submitted to [employer] via CMM and has been APPROVED from 04/28/2023-04/27/2024  Key: Margie Billet

## 2023-05-11 ENCOUNTER — Ambulatory Visit: Payer: Managed Care, Other (non HMO) | Admitting: Neurology

## 2023-05-12 ENCOUNTER — Ambulatory Visit: Payer: Managed Care, Other (non HMO) | Admitting: Internal Medicine

## 2023-05-12 ENCOUNTER — Encounter: Payer: Self-pay | Admitting: Internal Medicine

## 2023-05-12 VITALS — BP 118/78 | HR 75 | Ht 65.0 in | Wt 164.0 lb

## 2023-05-12 DIAGNOSIS — K638219 Small intestinal bacterial overgrowth, unspecified: Secondary | ICD-10-CM | POA: Diagnosis not present

## 2023-05-12 DIAGNOSIS — Z86010 Personal history of colonic polyps: Secondary | ICD-10-CM

## 2023-05-12 DIAGNOSIS — K219 Gastro-esophageal reflux disease without esophagitis: Secondary | ICD-10-CM | POA: Diagnosis not present

## 2023-05-12 DIAGNOSIS — K5904 Chronic idiopathic constipation: Secondary | ICD-10-CM | POA: Diagnosis not present

## 2023-05-12 MED ORDER — METRONIDAZOLE 250 MG PO TABS: 250.0000 mg | ORAL_TABLET | Freq: Three times a day (TID) | ORAL | 0 refills | Status: AC

## 2023-05-12 MED ORDER — OMEPRAZOLE 40 MG PO CPDR
40.0000 mg | DELAYED_RELEASE_CAPSULE | Freq: Every day | ORAL | 11 refills | Status: DC
Start: 2023-05-12 — End: 2024-01-04

## 2023-05-12 MED ORDER — MOTEGRITY 2 MG PO TABS: 1.0000 | ORAL_TABLET | Freq: Every day | ORAL | 11 refills | Status: DC

## 2023-05-12 MED ORDER — NA SULFATE-K SULFATE-MG SULF 17.5-3.13-1.6 GM/177ML PO SOLN: 1.0000 | Freq: Once | ORAL | 0 refills | Status: AC

## 2023-05-12 NOTE — Patient Instructions (Signed)
We have sent the following medications to your pharmacy for you to pick up at your convenience: Motegrity, omeprazole and flagyl.   You have been scheduled for a colonoscopy. Please follow written instructions given to you at your visit today.   Please pick up your prep supplies at the pharmacy within the next 1-3 days.  If you use inhalers (even only as needed), please bring them with you on the day of your procedure.  DO NOT TAKE 7 DAYS PRIOR TO TEST- Trulicity (dulaglutide) Ozempic, Wegovy (semaglutide) Mounjaro (tirzepatide) Bydureon Bcise (exanatide extended release)  DO NOT TAKE 1 DAY PRIOR TO YOUR TEST Rybelsus (semaglutide) Adlyxin (lixisenatide) Victoza (liraglutide) Byetta (exanatide) ________________________________________________________________________________________________________________________________  If your blood pressure at your visit was 140/90 or greater, please contact your primary care physician to follow up on this.  _______________________________________________________  If you are age 9 or older, your body mass index should be between 23-30. Your Body mass index is 27.29 kg/m. If this is out of the aforementioned range listed, please consider follow up with your Primary Care Provider.  If you are age 49 or younger, your body mass index should be between 19-25. Your Body mass index is 27.29 kg/m. If this is out of the aformentioned range listed, please consider follow up with your Primary Care Provider.   ________________________________________________________  The [facility] providers would like to encourage you to use [facility] to communicate with providers for non-urgent requests or questions.  Due to long hold times on the telephone, sending your provider a message by [facility] may be a faster and more efficient way to get a response.  Please allow 48 business hours for a response.  Please remember that this is for non-urgent requests.   _______________________________________________________

## 2023-05-12 NOTE — Progress Notes (Signed)
Subjective:    Patient ID: [name], male    DOB: 1961/04/19, 62 y.o.   MRN: [id number]  HPI [name] is a 62 year old male with chronic idiopathic constipation, adenomatous and sessile serrated colon polyps, prior hemorrhoidectomy, anal fissure and GERD who is here for follow-up.  He was last seen in February 2024.  He is here alone today.  He reports that he has been using Motegrity since [place] became cost prohibitive.  He had tried this before but most recently it seems to be working pretty well.  He is having issues with bloating and increased flatulence.  He is having 1-2 bowel movements most days.  When he travels he feels more constipated.  He has had no blood in stool or melena.  He has diltiazem gel with success and his fissure has healed.  He continues omeprazole which controls his heartburn.  He has had some globus sensation and has had his thyroid ultrasound done recently and was told this was normal.  He is not having sore throat, heartburn, throat clearing or trouble swallowing.  No odynophagia.  He is due colonoscopy this year but reports his last exam I was out of network.  He is not sure if we are in network for this year.   Review of Systems As per HPI, otherwise negative  Current Medications, Allergies, Past Medical History, Past Surgical History, Family History and Social History were reviewed in [employer] record.    Objective:   Physical Exam BP 118/78   Pulse 75   Ht 5\' 5"  (1.651 m)   Wt 164 lb (74.4 kg)   BMI 27.29 kg/m  Gen: awake, alert, NAD HEENT: anicteric  CV: RRR, no mrg Pulm: CTA b/l Abd: soft, NT/ND, +BS throughout Ext: no c/c/e Neuro: nonfocal     Latest Ref Rng & Units 08/25/2022    3:54 PM 05/09/2022   11:30 AM 02/03/2022    8:06 PM  CBC  WBC 4.0 - 10.5 K/uL 5.7  5.5  5.8   Hemoglobin 13.0 - 17.0 g/dL 86.5  78.4  69.6   Hematocrit 39.0 - 52.0 % 41.0  39.8  41.1   Platelets 150.0 - 400.0 K/uL 266.0   241.0  232    CMP     Component Value Date/Time   NA 139 08/25/2022 1554   NA 139 08/03/2020 1152   K 4.1 08/25/2022 1554   CL 103 08/25/2022 1554   CO2 28 08/25/2022 1554   GLUCOSE 80 08/25/2022 1554   BUN 18 08/25/2022 1554   BUN 10 08/03/2020 1152   CREATININE 0.90 08/25/2022 1554   CREATININE 0.96 05/12/2016 1723   CALCIUM 9.4 08/25/2022 1554   PROT 7.1 08/25/2022 1554   PROT 7.2 08/03/2020 1152   ALBUMIN 4.7 08/25/2022 1554   ALBUMIN 4.6 08/03/2020 1152   AST 27 08/25/2022 1554   ALT 34 08/25/2022 1554   ALKPHOS 118 (H) 08/25/2022 1554   BILITOT 0.6 08/25/2022 1554   BILITOT 0.8 08/03/2020 1152   GFR 93.73 08/25/2022 1554   GFRNONAA >60 02/03/2022 2006         Assessment & Plan:   62 year old male with chronic idiopathic constipation, adenomatous and sessile serrated colon polyps, prior hemorrhoidectomy, anal fissure and GERD who is here for follow-up.    Chronic idiopathic constipation --previous nonresponse or ineffective response to Linzess, Amitiza, lactulose and MiraLAX.  Trulance works the best for him but is cost prohibitive.  Motegrity seems to be effective for  now -- Continue Motegrity 2 mg daily  2.  Gas bloating and flatulence --suspicious for SIBO -- Metronidazole 250 mg 3 times daily x 7 days  3.  GERD --heartburn is well-controlled continue omeprazole 40 mg daily  4.  History of colon polyps --surveillance colonoscopy due later this year.  We will schedule for October and perform precertification to ensure that we are in network.  He hopes to complete this colonoscopy with me either way. -- Surveillance colonoscopy later this year to be scheduled today; we discussed the risk, benefits and alternatives and he is agreeable and wishes to proceed  30 minutes total spent today including patient facing time, coordination of care, reviewing medical history/procedures/pertinent radiology studies, and documentation of the encounter.

## 2023-07-06 ENCOUNTER — Ambulatory Visit (INDEPENDENT_AMBULATORY_CARE_PROVIDER_SITE_OTHER): Payer: Managed Care, Other (non HMO)

## 2023-07-06 ENCOUNTER — Ambulatory Visit (HOSPITAL_COMMUNITY)
Admission: RE | Admit: 2023-07-06 | Discharge: 2023-07-06 | Disposition: A | Discharge status: Home / Self Care | Payer: Managed Care, Other (non HMO) | Source: Ambulatory Visit | Attending: Family Medicine | Admitting: Family Medicine

## 2023-07-06 ENCOUNTER — Encounter (HOSPITAL_COMMUNITY): Payer: Self-pay

## 2023-07-06 ENCOUNTER — Other Ambulatory Visit: Payer: Self-pay

## 2023-07-06 VITALS — BP 130/82 | HR 75 | Temp 98.4°F | Resp 18

## 2023-07-06 DIAGNOSIS — R051 Acute cough: Secondary | ICD-10-CM | POA: Diagnosis not present

## 2023-07-06 DIAGNOSIS — R079 Chest pain, unspecified: Secondary | ICD-10-CM

## 2023-07-06 MED ORDER — BENZONATATE 200 MG PO CAPS: ORAL_CAPSULE | ORAL | 0 refills | Status: DC

## 2023-07-06 MED ORDER — PROMETHAZINE-DM 6.25-15 MG/5ML PO SYRP: 5.0000 mL | ORAL_SOLUTION | Freq: Every evening | ORAL | 0 refills | Status: DC | PRN

## 2023-07-06 NOTE — ED Triage Notes (Signed)
Pt went to [facility] and was given molnupiravir 200mg  the patient completed med.

## 2023-07-06 NOTE — ED Triage Notes (Signed)
Pt reports CP when he lies down and coughing. Pt tested positive for COVID on 06-29-23

## 2023-07-06 NOTE — Discharge Instructions (Signed)
Your chest x-ray did not show any signs of infection. This is good. Take the cough medications as prescribed.

## 2023-07-08 NOTE — ED Provider Notes (Signed)
[facility]   [id number] 07/06/23 Arrival Time: 1231  ASSESSMENT & PLAN:  1. Acute cough    Discussed typical duration of likely viral illness. I have personally viewed and independently interpreted the imaging studies ordered this visit. No acute changes on CXR.  OTC symptom care as needed. Work note provided.  Discharge Medication List as of 07/06/2023  2:24 PM     START taking these medications   Details  benzonatate (TESSALON) 200 MG capsule Take 1 capsule by mouth every 8 (eight) hours for cough., Normal    promethazine-dextromethorphan (PROMETHAZINE-DM) 6.25-15 MG/5ML syrup Take 5 mLs by mouth at bedtime as needed for cough., Starting Mon 07/06/2023, Normal         Follow-up Information     Sagardia, Eilleen Kempf, MD.   Specialty: Internal Medicine Why: As needed. Contact information: [address] [place] [address] [phone]                 Reviewed expectations re: course of current medical issues. Questions answered. Outlined signs and symptoms indicating need for more acute intervention. Understanding verbalized. After Visit Summary given.   SUBJECTIVE: History from: Patient. [name] is a 62 y.o. male. Pt reports CP when he lies down and coughing. Pt tested positive for COVID on 06-29-23. Cough is affecting sleep. [facility] and was given molnupiravir 200mg  the patient completed med. Denies: difficulty breathing. Denies fever. Normal PO intake without n/v/d.  OBJECTIVE:  Vitals:   07/06/23 1315  BP: 130/82  Pulse: 75  Resp: 18  Temp: 98.4 F (36.9 C)  SpO2: 93%    General appearance: alert; no distress Eyes: PERRLA; EOMI; conjunctiva normal HENT: [place]; AT; without nasal congestion Neck: supple  Lungs: speaks full sentences without difficulty; unlabored; CTAB Extremities: no edema Skin: warm and dry Neurologic: normal gait Psychological: alert and cooperative; normal mood and  affect  Labs:  Labs Reviewed - No data to display  Imaging: DG Chest 2 View  Result Date: 07/06/2023 CLINICAL DATA:  Chest pain when lying down. Cough. Positive COVID-19 test on 06/29/2023. EXAM: CHEST - 2 VIEW COMPARISON:  08/25/2022. FINDINGS: The heart size and mediastinal contours are within normal limits. Both lungs are clear. The visualized skeletal structures are unremarkable. IMPRESSION: No active cardiopulmonary disease. Electronically Signed   By: Beckie Salts M.D.   On: 07/06/2023 14:25    No Known Allergies  Past Medical History:  Diagnosis Date   Anxiety    ARM PAIN, LEFT 11/20/2009   Arthritis    BIPOLAR DISORDER UNSPECIFIED 12/13/2007   patient denies   Blood transfusion without reported diagnosis    CHEST PAIN 03/23/2009   currently having    DEPRESSION 12/13/2007   FATIGUE 03/23/2009   GERD (gastroesophageal reflux disease)    GLAUCOMA 09/03/2009   Headache    HEMORRHOIDS 09/03/2009   HOARSENESS 09/03/2009   HYPERCHOLESTEROLEMIA 03/11/2010   MYALGIA 08/13/2009   NECK PAIN 01/18/2009   Palpitations 03/23/2009   Polydipsia 01/24/2008   Shortness of breath 03/23/2009   THYROID NODULE 08/28/2008   benign on Bx   THYROIDITIS 12/13/2007   Tubular adenoma of colon    Tubular adenoma of colon    Social History   Socioeconomic History   Marital status: Divorced    Spouse name: Not on file   Number of children: 1   Years of education: Not on file   Highest education level: Not on file  Occupational History   Occupation:  Bagger at [employer]  Tobacco Use   Smoking status: Never   Smokeless tobacco: Never  Vaping Use   Vaping status: Never Used  Substance and Sexual Activity   Alcohol use: No    Alcohol/week: 0.0 standard drinks of alcohol   Drug use: No   Sexual activity: Not on file  Other Topics Concern   Not on file  Social History Narrative   Mr Fran lives with his wife in [place], 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       He is originally form [place]         Are you right handed or left handed? Right Handed    Are you currently employed ? 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 Yes   Who lives with you? No one    What type of home do you live in: 1 story or 2 story? Lives in a one story home       Social Determinants of Health   Financial Resource Strain: Not on file  Food Insecurity: Not on file  Transportation Needs: Not on file  Physical Activity: Not on file  Stress: Not on file  Social Connections: Not on file  Intimate Partner Violence: Not on file   Family History  Problem Relation Age of Onset   Cancer Father 58       colon   Colon cancer Father    Dementia Mother    Hypertension Other    Past Surgical History:  Procedure Laterality Date   BACK SURGERY     x 3, lumbar   EYE SURGERY     2008   FINGER SURGERY     TRANSANAL HEMORRHOIDAL DEARTERIALIZATION N/A 03/07/2016   Procedure: TRANSANAL HEMORRHOIDAL DEARTERIALIZATION HEMORRHOIDAL LIGATION/PEXY EXAM UNDER ANESTHESIA WITH  POSSIBLE HEMORRHOIDECTOMY ;  Surgeon: Karie Soda, MD;  Location: [facility];  Service: General;  Laterality: Vertis Kelch, MD 07/08/23 1320

## 2023-07-27 ENCOUNTER — Encounter: Payer: Self-pay | Admitting: Internal Medicine

## 2023-08-03 ENCOUNTER — Ambulatory Visit: Payer: Managed Care, Other (non HMO) | Admitting: Dermatology

## 2023-08-10 ENCOUNTER — Ambulatory Visit: Payer: Managed Care, Other (non HMO) | Admitting: Internal Medicine

## 2023-08-10 ENCOUNTER — Encounter: Payer: Self-pay | Admitting: Internal Medicine

## 2023-08-10 VITALS — BP 128/76 | HR 65 | Temp 99.8°F | Resp 20 | Ht 65.0 in | Wt 164.0 lb

## 2023-08-10 DIAGNOSIS — D122 Benign neoplasm of ascending colon: Secondary | ICD-10-CM

## 2023-08-10 DIAGNOSIS — Z860100 Personal history of colon polyps, unspecified: Secondary | ICD-10-CM

## 2023-08-10 DIAGNOSIS — Z860101 Personal history of adenomatous and serrated colon polyps: Secondary | ICD-10-CM

## 2023-08-10 DIAGNOSIS — K635 Polyp of colon: Secondary | ICD-10-CM | POA: Diagnosis not present

## 2023-08-10 DIAGNOSIS — Z09 Encounter for follow-up examination after completed treatment for conditions other than malignant neoplasm: Secondary | ICD-10-CM | POA: Diagnosis present

## 2023-08-10 DIAGNOSIS — D128 Benign neoplasm of rectum: Secondary | ICD-10-CM

## 2023-08-10 DIAGNOSIS — K219 Gastro-esophageal reflux disease without esophagitis: Secondary | ICD-10-CM

## 2023-08-10 MED ORDER — MOTEGRITY 2 MG PO TABS: 2.0000 mg | ORAL_TABLET | Freq: Every day | ORAL | 0 refills | Status: DC

## 2023-08-10 MED ORDER — OMEPRAZOLE 40 MG PO CPDR: 40.0000 mg | DELAYED_RELEASE_CAPSULE | Freq: Every day | ORAL | 0 refills | Status: DC

## 2023-08-10 MED ORDER — SODIUM CHLORIDE 0.9 % IV SOLN: 500.0000 mL | Freq: Once | INTRAVENOUS | Status: DC

## 2023-08-10 NOTE — Progress Notes (Signed)
GASTROENTEROLOGY PROCEDURE H&P NOTE   Primary Care Physician: Georgina Quint, MD    Reason for Procedure:  History of adenomatous and sessile serrated colon polyps  Plan:    Colonoscopy  Patient is appropriate for endoscopic procedure(s) in the ambulatory ([facility]) setting.  The nature of the procedure, as well as the risks, benefits, and alternatives were carefully and thoroughly reviewed with the patient. Ample time for discussion and questions allowed. The patient understood, was satisfied, and agreed to proceed.     HPI: [name] is a 62 y.o. male who presents for surveillance colonoscopy.  Medical history as below.  Tolerated the prep.  No recent chest pain or shortness of breath.  No abdominal pain today.  Past Medical History:  Diagnosis Date   Anxiety    ARM PAIN, LEFT 11/20/2009   Arthritis    BIPOLAR DISORDER UNSPECIFIED 12/13/2007   patient denies   Blood transfusion without reported diagnosis    CHEST PAIN 03/23/2009   currently having    DEPRESSION 12/13/2007   FATIGUE 03/23/2009   GERD (gastroesophageal reflux disease)    GLAUCOMA 09/03/2009   Headache    HEMORRHOIDS 09/03/2009   HOARSENESS 09/03/2009   HYPERCHOLESTEROLEMIA 03/11/2010   MYALGIA 08/13/2009   NECK PAIN 01/18/2009   Palpitations 03/23/2009   Polydipsia 01/24/2008   Shortness of breath 03/23/2009   THYROID NODULE 08/28/2008   benign on Bx   THYROIDITIS 12/13/2007   Tubular adenoma of colon    Tubular adenoma of colon     Past Surgical History:  Procedure Laterality Date   BACK SURGERY     x 3, lumbar   EYE SURGERY     2008   FINGER SURGERY     TRANSANAL HEMORRHOIDAL DEARTERIALIZATION N/A 03/07/2016   Procedure: TRANSANAL HEMORRHOIDAL DEARTERIALIZATION HEMORRHOIDAL LIGATION/PEXY EXAM UNDER ANESTHESIA WITH  POSSIBLE HEMORRHOIDECTOMY ;  Surgeon: Karie Soda, MD;  Location: [facility];  Service: General;  Laterality: N/A;    Prior to Admission medications   Medication Sig  Start Date End Date Taking? Authorizing Provider  amoxicillin (AMOXIL) 500 MG capsule Take 500 mg by mouth 3 (three) times daily. 04/14/23  Yes [provider]  atorvastatin (LIPITOR) 20 MG tablet TAKE 1 TABLET BY MOUTH DAILY 01/06/23  Yes Sagardia, Eilleen Kempf, MD  benzonatate (TESSALON) 200 MG capsule Take 1 capsule by mouth every 8 (eight) hours for cough. 07/06/23  Yes Hagler, Arlys John, MD  clindamycin-benzoyl peroxide (BENZACLIN) gel Apply topically 2 (two) times daily. 05/28/20  Yes Janeece Agee, NP  clotrimazole-betamethasone (LOTRISONE) cream Apply twice daily to feet as needed for flares of rash 03/30/23  Yes Terri Piedra, DO  Efinaconazole (JUBLIA) 10 % SOLN Apply every night at bedtime to toenails. 03/30/23  Yes Terri Piedra, DO  escitalopram (LEXAPRO) 10 MG tablet Take 10 mg by mouth daily.   Yes [provider]  fluticasone (FLONASE) 50 MCG/ACT nasal spray Place 1 spray into both nostrils 2 (two) times daily. 08/25/22  Yes Sagardia, Eilleen Kempf, MD  gabapentin (NEURONTIN) 300 MG capsule Take 1 tablet x 1 week, then increase 2 tablet at bedtime. 11/11/22  Yes Nita Sickle K, DO  mirtazapine (REMERON) 45 MG tablet  06/02/17  Yes [provider]  omeprazole (PRILOSEC) 40 MG capsule Take 1 capsule (40 mg total) by mouth daily. 05/12/23  Yes Shelva Hetzer, Carie Caddy, MD  potassium chloride (KLOR-CON M) 10 MEQ tablet Take 1 tablet (10 mEq total) by mouth daily. 08/25/22  Yes Sagardia, [place],  MD  promethazine-dextromethorphan (PROMETHAZINE-DM) 6.25-15 MG/5ML syrup Take 5 mLs by mouth at bedtime as needed for cough. 07/06/23  Yes Hagler, Arlys John, MD  Prucalopride Succinate (MOTEGRITY) 2 MG TABS Take 1 tablet (2 mg total) by mouth daily. 05/12/23  Yes Tracker Mance, Carie Caddy, MD  tacrolimus (PROTOPIC) 0.1 % ointment Apply topically 2 (two) times daily. 03/30/23  Yes Terri Piedra, DO  Travoprost (TRAVATAN OP) Apply to eye at bedtime.   Yes [provider]  diclofenac Sodium  (VOLTAREN) 1 % GEL Apply 2 g topically 4 (four) times daily. 08/03/20   Lezlie Lye, Meda Coffee, MD  griseofulvin (GRIS-PEG) 250 MG tablet Take 1 tablet (250 mg total) by mouth daily. 02/19/23   Terri Piedra, DO  ibuprofen (ADVIL) 600 MG tablet Take 1 tablet (600 mg total) by mouth every 8 (eight) hours as needed. 10/14/22   Georgina Quint, MD  methocarbamol (ROBAXIN) 500 MG tablet TAKE ONE TABLET BY MOUTH EVERY MORNING AND AFTERNOON. TAKE TWO TABLETS BY MOUTH EVERY NIGHT AT BEDTIME AS NEEDED FOR MUSCLE RELAXATION Patient not taking: Reported on 08/10/2023 08/25/22   Georgina Quint, MD  Multiple Vitamin (MULTIVITAMIN) tablet Take 1 tablet by mouth daily.    [provider]  nystatin-triamcinolone ointment (MYCOLOG) Apply to groin area twice daily up to 2 weeks as needed for worse flares of rash 03/30/23   Terri Piedra, DO  sildenafil (VIAGRA) 100 MG tablet Take 0.5-1 tablets (50-100 mg total) by mouth daily as needed for erectile dysfunction. 08/25/22   Georgina Quint, MD  Travoprost, BAK Free, (TRAVATAN) 0.004 % SOLN ophthalmic solution  09/28/20   [provider]    Current Outpatient Medications  Medication Sig Dispense Refill   amoxicillin (AMOXIL) 500 MG capsule Take 500 mg by mouth 3 (three) times daily.     atorvastatin (LIPITOR) 20 MG tablet TAKE 1 TABLET BY MOUTH DAILY 90 tablet 2   benzonatate (TESSALON) 200 MG capsule Take 1 capsule by mouth every 8 (eight) hours for cough. 30 capsule 0   clindamycin-benzoyl peroxide (BENZACLIN) gel Apply topically 2 (two) times daily. 25 g 0   clotrimazole-betamethasone (LOTRISONE) cream Apply twice daily to feet as needed for flares of rash 30 g 2   Efinaconazole (JUBLIA) 10 % SOLN Apply every night at bedtime to toenails. 8 mL 11   escitalopram (LEXAPRO) 10 MG tablet Take 10 mg by mouth daily.     fluticasone (FLONASE) 50 MCG/ACT nasal spray Place 1 spray into both nostrils 2 (two) times daily. 16 g 6    gabapentin (NEURONTIN) 300 MG capsule Take 1 tablet x 1 week, then increase 2 tablet at bedtime. 60 capsule 3   mirtazapine (REMERON) 45 MG tablet      omeprazole (PRILOSEC) 40 MG capsule Take 1 capsule (40 mg total) by mouth daily. 30 capsule 11   potassium chloride (KLOR-CON M) 10 MEQ tablet Take 1 tablet (10 mEq total) by mouth daily. 30 tablet 2   promethazine-dextromethorphan (PROMETHAZINE-DM) 6.25-15 MG/5ML syrup Take 5 mLs by mouth at bedtime as needed for cough. 118 mL 0   Prucalopride Succinate (MOTEGRITY) 2 MG TABS Take 1 tablet (2 mg total) by mouth daily. 30 tablet 11   tacrolimus (PROTOPIC) 0.1 % ointment Apply topically 2 (two) times daily. 60 g 2   Travoprost (TRAVATAN OP) Apply to eye at bedtime.     diclofenac Sodium (VOLTAREN) 1 % GEL Apply 2 g topically 4 (four) times daily. 100 g 5  griseofulvin (GRIS-PEG) 250 MG tablet Take 1 tablet (250 mg total) by mouth daily. 30 tablet 0   ibuprofen (ADVIL) 600 MG tablet Take 1 tablet (600 mg total) by mouth every 8 (eight) hours as needed. 30 tablet 0   methocarbamol (ROBAXIN) 500 MG tablet TAKE ONE TABLET BY MOUTH EVERY MORNING AND AFTERNOON. TAKE TWO TABLETS BY MOUTH EVERY NIGHT AT BEDTIME AS NEEDED FOR MUSCLE RELAXATION (Patient not taking: Reported on 08/10/2023) 40 tablet 0   Multiple Vitamin (MULTIVITAMIN) tablet Take 1 tablet by mouth daily.     nystatin-triamcinolone ointment (MYCOLOG) Apply to groin area twice daily up to 2 weeks as needed for worse flares of rash 30 g 2   sildenafil (VIAGRA) 100 MG tablet Take 0.5-1 tablets (50-100 mg total) by mouth daily as needed for erectile dysfunction. 90 each 3   Travoprost, BAK Free, (TRAVATAN) 0.004 % SOLN ophthalmic solution      Current Facility-Administered Medications  Medication Dose Route Frequency Provider Last Rate Last Admin   0.9 %  sodium chloride infusion  500 mL Intravenous Once Ravina Milner, Carie Caddy, MD        Allergies as of 08/10/2023   (No Known Allergies)    Family  History  Problem Relation Age of Onset   Dementia Mother    Cancer Father 37       colon   Colon cancer Father    Colon cancer Sister    Hypertension Other    Esophageal cancer Neg Hx    Rectal cancer Neg Hx    Stomach cancer Neg Hx     Social History   Socioeconomic History   Marital status: Divorced    Spouse name: Not on file   Number of children: 1   Years of education: Not on file   Highest education level: Not on file  Occupational History   Occupation: Merchandiser, retail at [employer]  Tobacco Use   Smoking status: Never   Smokeless tobacco: Never  Vaping Use   Vaping status: Never Used  Substance and Sexual Activity   Alcohol use: No    Alcohol/week: 0.0 standard drinks of alcohol   Drug use: No   Sexual activity: Not on file  Other Topics Concern   Not on file  Social History Narrative   Mr Dietrick lives with his wife in [place], 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       He is originally form [place]         Are you right handed or left handed? Right Handed    Are you currently employed ? 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 Yes   Who lives with you? No one    What type of home do you live in: 1 story or 2 story? Lives in a one story home       Social Determinants of Health   Financial Resource Strain: Not on file  Food Insecurity: Not on file  Transportation Needs: Not on file  Physical Activity: Not on file  Stress: Not on file  Social Connections: Not on file  Intimate Partner Violence: Not on file    Physical Exam: Vital signs in last 24 hours: @BP  134/86   Pulse 65   Temp 99.8 F (37.7 C)   Ht 5\' 5"  (1.651 m)   Wt 164 lb (74.4 kg)   SpO2 97%   BMI  27.29 kg/m  GEN: NAD EYE: Sclerae anicteric ENT: MMM CV: Non-tachycardic Pulm: CTA b/l GI: Soft, NT/ND NEURO:  Alert & Oriented x 3   Erick Blinks, MD [facility]  08/10/2023 1:58 PM

## 2023-08-10 NOTE — Progress Notes (Signed)
VS by MO  Pt's states no medical or surgical changes since previsit or office visit.

## 2023-08-10 NOTE — Op Note (Signed)
[facility] Patient Name: [name] Procedure Date: 08/10/2023 1:55 PM MRN: [id number] Endoscopist: Beverley Fiedler , MD, [id number] Age: 62 Referring MD:  Date of Birth: [birth year] Gender: Male Account #: [id number] Procedure:                Colonoscopy Indications:              High risk colon cancer surveillance: Personal                            history of sessile serrated colon polyp (10 mm or                            greater in size) and tubular adenomas, Last                            colonoscopy: December 2021 (SSP x 2, TA x 3) Medicines:                Monitored Anesthesia Care Procedure:                Pre-Anesthesia Assessment:                           - Prior to the procedure, a History and Physical                            was performed, and patient medications and                            allergies were reviewed. The patient's tolerance of                            previous anesthesia was also reviewed. The risks                            and benefits of the procedure and the sedation                            options and risks were discussed with the patient.                            All questions were answered, and informed consent                            was obtained. Prior Anticoagulants: The patient has                            taken no anticoagulant or antiplatelet agents. ASA                            Grade Assessment: II - A patient with mild systemic                            disease. After reviewing the risks and benefits,  the patient was deemed in satisfactory condition to                            undergo the procedure.                           After obtaining informed consent, the colonoscope                            was passed under direct vision. Throughout the                            procedure, the patient's blood pressure, pulse, and                            oxygen saturations were  monitored continuously. The                            CF [id number] #[id number] was introduced through the anus                            and advanced to the cecum, identified by                            appendiceal orifice and ileocecal valve. The                            colonoscopy was performed without difficulty. The                            patient tolerated the procedure well. The quality                            of the bowel preparation was good. The ileocecal                            valve, appendiceal orifice, and rectum were                            photographed. Scope In: 2:11:08 PM Scope Out: 2:26:43 PM Scope Withdrawal Time: 0 hours 12 minutes 8 seconds  Total Procedure Duration: 0 hours 15 minutes 35 seconds  Findings:                 The digital rectal exam was normal.                           A 4 mm polyp was found in the ascending colon. The                            polyp was sessile. The polyp was removed with a                            cold snare. Resection and retrieval were complete.  A 4 mm polyp was found in the rectum. The polyp was                            sessile. The polyp was removed with a cold snare.                            Resection and retrieval were complete.                           The exam was otherwise without abnormality on                            direct and retroflexion views. Complications:            No immediate complications. Estimated Blood Loss:     Estimated blood loss: none. Impression:               - One 4 mm polyp in the ascending colon, removed                            with a cold snare. Resected and retrieved.                           - One 4 mm polyp in the rectum, removed with a cold                            snare. Resected and retrieved.                           - The examination was otherwise normal on direct                            and retroflexion views. Prior  hemorrhoidectomy                            scaring in distal rectum. Recommendation:           - Patient has a contact number available for                            emergencies. The signs and symptoms of potential                            delayed complications were discussed with the                            patient. Return to normal activities tomorrow.                            Written discharge instructions were provided to the                            patient.                           -  Resume previous diet.                           - Continue present medications.                           - Await pathology results.                           - Repeat colonoscopy in 5 years for surveillance. Beverley Fiedler, MD 08/10/2023 2:29:51 PM This report has been signed electronically.

## 2023-08-10 NOTE — Progress Notes (Signed)
Called to room to assist during endoscopic procedure.  Patient ID and intended procedure confirmed with present staff. Received instructions for my participation in the procedure from the performing physician.  

## 2023-08-10 NOTE — Patient Instructions (Addendum)
Resume previous diet. Continue present medications. Await pathology results. Repeat colonoscopy in 5 years for surveillance.  YOU HAD AN ENDOSCOPIC PROCEDURE TODAY AT THE [facility]:   Refer to the procedure report that was given to you for any specific questions about what was found during the examination.  If the procedure report does not answer your questions, please call your gastroenterologist to clarify.  If you requested that your care partner not be given the details of your procedure findings, then the procedure report has been included in a sealed envelope for you to review at your convenience later.  YOU SHOULD EXPECT: Some feelings of bloating in the abdomen. Passage of more gas than usual.  Walking can help get rid of the air that was put into your GI tract during the procedure and reduce the bloating. If you had a lower endoscopy (such as a colonoscopy or flexible sigmoidoscopy) you may notice spotting of blood in your stool or on the toilet paper. If you underwent a bowel prep for your procedure, you may not have a normal bowel movement for a few days.  Please Note:  You might notice some irritation and congestion in your nose or some drainage.  This is from the oxygen used during your procedure.  There is no need for concern and it should clear up in a day or so.  SYMPTOMS TO REPORT IMMEDIATELY:  Following lower endoscopy (colonoscopy or flexible sigmoidoscopy):  Excessive amounts of blood in the stool  Significant tenderness or worsening of abdominal pains  Swelling of the abdomen that is new, acute  Fever of 100F or higher   For urgent or emergent issues, a gastroenterologist can be reached at any hour by calling [phone]. Do not use MyChart messaging for urgent concerns.    DIET:  We do recommend a small meal at first, but then you may proceed to your regular diet.  Drink plenty of fluids but you should avoid alcoholic beverages for 24  hours.  ACTIVITY:  You should plan to take it easy for the rest of today and you should NOT DRIVE or use heavy machinery until tomorrow (because of the sedation medicines used during the test).    FOLLOW UP: Our staff will call the number listed on your records the next business day following your procedure.  We will call around 7:15- 8:00 am to check on you and address any questions or concerns that you may have regarding the information given to you following your procedure. If we do not reach you, we will leave a message.     If any biopsies were taken you will be contacted by phone or by letter within the next 1-3 weeks.  Please call us at [phone] if you have not heard about the biopsies in 3 weeks.    SIGNATURES/CONFIDENTIALITY: You and/or your care partner have signed paperwork which will be entered into your electronic medical record.  These signatures attest to the fact that that the information above on your After Visit Summary has been reviewed and is understood.  Full responsibility of the confidentiality of this discharge information lies with you and/or your care-partner.

## 2023-08-10 NOTE — Progress Notes (Signed)
Uneventful anesthetic. Report to pacu rn. Vss. Care resumed by rn. 

## 2023-08-11 ENCOUNTER — Telehealth: Payer: Self-pay

## 2023-08-11 NOTE — Telephone Encounter (Signed)
No answer, left message to call if having any issues or concerns, B.Lj Miyamoto RN 

## 2023-08-13 LAB — SURGICAL PATHOLOGY

## 2023-08-18 ENCOUNTER — Encounter: Payer: Self-pay | Admitting: Internal Medicine

## 2023-08-31 ENCOUNTER — Encounter: Payer: Self-pay | Admitting: Emergency Medicine

## 2023-08-31 ENCOUNTER — Ambulatory Visit (INDEPENDENT_AMBULATORY_CARE_PROVIDER_SITE_OTHER): Payer: Managed Care, Other (non HMO) | Admitting: Emergency Medicine

## 2023-08-31 VITALS — BP 124/88 | HR 69 | Temp 98.3°F | Ht 65.0 in | Wt 161.2 lb

## 2023-08-31 DIAGNOSIS — Z130 Encounter for screening for diseases of the blood and blood-forming organs and certain disorders involving the immune mechanism: Secondary | ICD-10-CM

## 2023-08-31 DIAGNOSIS — Z125 Encounter for screening for malignant neoplasm of prostate: Secondary | ICD-10-CM

## 2023-08-31 DIAGNOSIS — Z0000 Encounter for general adult medical examination without abnormal findings: Secondary | ICD-10-CM | POA: Diagnosis not present

## 2023-08-31 DIAGNOSIS — Z1329 Encounter for screening for other suspected endocrine disorder: Secondary | ICD-10-CM | POA: Diagnosis not present

## 2023-08-31 DIAGNOSIS — G629 Polyneuropathy, unspecified: Secondary | ICD-10-CM | POA: Diagnosis not present

## 2023-08-31 DIAGNOSIS — Z13228 Encounter for screening for other metabolic disorders: Secondary | ICD-10-CM

## 2023-08-31 DIAGNOSIS — Z13220 Encounter for screening for lipoid disorders: Secondary | ICD-10-CM | POA: Diagnosis not present

## 2023-08-31 LAB — COMPREHENSIVE METABOLIC PANEL
ALT: 37 U/L (ref 0–53)
AST: 30 U/L (ref 0–37)
Albumin: 4.5 g/dL (ref 3.5–5.2)
Alkaline Phosphatase: 106 U/L (ref 39–117)
BUN: 14 mg/dL (ref 6–23)
CO2: 31 meq/L (ref 19–32)
Calcium: 9.3 mg/dL (ref 8.4–10.5)
Chloride: 102 meq/L (ref 96–112)
Creatinine, Ser: 0.93 mg/dL (ref 0.40–1.50)
GFR: 89.47 mL/min (ref >60.00)
Glucose, Bld: 92 mg/dL (ref 70–99)
Potassium: 4.6 meq/L (ref 3.5–5.1)
Sodium: 140 meq/L (ref 135–145)
Total Bilirubin: 0.7 mg/dL (ref 0.2–1.2)
Total Protein: 7.1 g/dL (ref 6.0–8.3)

## 2023-08-31 LAB — CBC WITH DIFFERENTIAL/PLATELET
Basophils Absolute: 0 10*3/uL (ref 0.0–0.1)
Basophils Relative: 0.3 % (ref 0.0–3.0)
Eosinophils Absolute: 0.1 10*3/uL (ref 0.0–0.7)
Eosinophils Relative: 1.1 % (ref 0.0–5.0)
HCT: 42.3 % (ref 39.0–52.0)
Hemoglobin: 14 g/dL (ref 13.0–17.0)
Lymphocytes Relative: 58.1 % — ABNORMAL HIGH (ref 12.0–46.0)
Lymphs Abs: 3 10*3/uL (ref 0.7–4.0)
MCHC: 33 g/dL (ref 30.0–36.0)
MCV: 87.1 fL (ref 78.0–100.0)
Monocytes Absolute: 0.4 10*3/uL (ref 0.1–1.0)
Monocytes Relative: 8.5 % (ref 3.0–12.0)
Neutro Abs: 1.6 10*3/uL (ref 1.4–7.7)
Neutrophils Relative %: 32 % — ABNORMAL LOW (ref 43.0–77.0)
Platelets: 269 10*3/uL (ref 150.0–400.0)
RBC: 4.86 Mil/uL (ref 4.22–5.81)
RDW: 13.8 % (ref 11.5–15.5)
WBC: 5.1 10*3/uL (ref 4.0–10.5)

## 2023-08-31 LAB — LIPID PANEL
Cholesterol: 175 mg/dL (ref 0–200)
HDL: 79.2 mg/dL (ref >39.00)
LDL Cholesterol: 76 mg/dL (ref 0–99)
NonHDL: 95.61
Total CHOL/HDL Ratio: 2
Triglycerides: 100 mg/dL (ref 0.0–149.0)
VLDL: 20 mg/dL (ref 0.0–40.0)

## 2023-08-31 LAB — HEMOGLOBIN A1C: Hgb A1c MFr Bld: 6.2 % (ref 4.6–6.5)

## 2023-08-31 LAB — PSA: PSA: 1.11 ng/mL (ref 0.10–4.00)

## 2023-08-31 LAB — TSH: TSH: 1.65 u[IU]/mL (ref 0.35–5.50)

## 2023-08-31 MED ORDER — IBUPROFEN 600 MG PO TABS
600.0000 mg | ORAL_TABLET | Freq: Three times a day (TID) | ORAL | 0 refills | Status: DC | PRN
Start: 2023-08-31 — End: 2024-06-16

## 2023-08-31 MED ORDER — FLUTICASONE PROPIONATE 50 MCG/ACT NA SUSP: 1.0000 | Freq: Two times a day (BID) | NASAL | 6 refills | Status: DC

## 2023-08-31 MED ORDER — SILDENAFIL CITRATE 100 MG PO TABS: 50.0000 mg | ORAL_TABLET | Freq: Every day | ORAL | 3 refills | Status: DC | PRN

## 2023-08-31 NOTE — Progress Notes (Signed)
[name] 62 y.o.   Chief Complaint  Patient presents with   Annual Exam    No concerns     HISTORY OF PRESENT ILLNESS: This is a 62 y.o. male here for annual exam. Has no complaints or medical concerns today.  HPI   Prior to Admission medications   Medication Sig Start Date End Date Taking? Authorizing Provider  atorvastatin (LIPITOR) 20 MG tablet TAKE 1 TABLET BY MOUTH DAILY 01/06/23  Yes Aimar Shrewsbury, Farley, MD  clotrimazole-betamethasone (LOTRISONE) cream Apply twice daily to feet as needed for flares of rash 03/30/23  Yes Terri Piedra, DO  diclofenac Sodium (VOLTAREN) 1 % GEL Apply 2 g topically 4 (four) times daily. 08/03/20  Yes Lezlie Lye, Irma M, MD  Efinaconazole (JUBLIA) 10 % SOLN Apply every night at bedtime to toenails. 03/30/23  Yes Terri Piedra, DO  escitalopram (LEXAPRO) 10 MG tablet Take 10 mg by mouth daily.   Yes [provider]  gabapentin (NEURONTIN) 300 MG capsule Take 1 tablet x 1 week, then increase 2 tablet at bedtime. 11/11/22  Yes Patel, Donika K, DO  griseofulvin (GRIS-PEG) 250 MG tablet Take 1 tablet (250 mg total) by mouth daily. 02/19/23  Yes Terri Piedra, DO  mirtazapine (REMERON) 45 MG tablet  06/02/17  Yes [provider]  Multiple Vitamin (MULTIVITAMIN) tablet Take 1 tablet by mouth daily.   Yes [provider]  omeprazole (PRILOSEC) 40 MG capsule Take 1 capsule (40 mg total) by mouth daily. 05/12/23  Yes Pyrtle, Carie Caddy, MD  potassium chloride (KLOR-CON M) 10 MEQ tablet Take 1 tablet (10 mEq total) by mouth daily. 08/25/22  Yes Kayzen Kendzierski, Eilleen Kempf, MD  Prucalopride Succinate (MOTEGRITY) 2 MG TABS Take 1 tablet (2 mg total) by mouth daily. 05/12/23  Yes Pyrtle, Carie Caddy, MD  tacrolimus (PROTOPIC) 0.1 % ointment Apply topically 2 (two) times daily. 03/30/23  Yes Terri Piedra, DO  Travoprost (TRAVATAN OP) Apply to eye at bedtime.   Yes [provider]  Travoprost, BAK Free, (TRAVATAN) 0.004 % SOLN  ophthalmic solution  09/28/20  Yes [provider]  clindamycin-benzoyl peroxide (BENZACLIN) gel Apply topically 2 (two) times daily. 05/28/20   Janeece Agee, NP  fluticasone (FLONASE) 50 MCG/ACT nasal spray Place 1 spray into both nostrils 2 (two) times daily. 08/31/23   Georgina Quint, MD  ibuprofen (ADVIL) 600 MG tablet Take 1 tablet (600 mg total) by mouth every 8 (eight) hours as needed. 08/31/23   Georgina Quint, MD  sildenafil (VIAGRA) 100 MG tablet Take 0.5-1 tablets (50-100 mg total) by mouth daily as needed for erectile dysfunction. 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/24/2021   Spinal stenosis of lumbar region 09/24/2021   Depression 08/16/2015   Erectile dysfunction 04/25/2015   Disorder of liver 10/13/2011   HYPERCHOLESTEROLEMIA 03/11/2010   Unspecified glaucoma 09/03/2009   THYROID NODULE 08/28/2008   BIPOLAR DISORDER UNSPECIFIED 12/13/2007    Past Medical History:  Diagnosis Date   Anxiety    ARM PAIN, LEFT 11/20/2009   Arthritis    BIPOLAR DISORDER UNSPECIFIED 12/13/2007   patient denies   Blood transfusion without reported diagnosis    CHEST PAIN 03/23/2009   currently having    DEPRESSION 12/13/2007  FATIGUE 03/23/2009   GERD (gastroesophageal reflux disease)    GLAUCOMA 09/03/2009   Headache    HEMORRHOIDS 09/03/2009   HOARSENESS 09/03/2009   HYPERCHOLESTEROLEMIA 03/11/2010   MYALGIA 08/13/2009   NECK PAIN 01/18/2009   Palpitations 03/23/2009   Polydipsia 01/24/2008   Shortness of breath 03/23/2009   THYROID NODULE 08/28/2008   benign on Bx   THYROIDITIS 12/13/2007   Tubular adenoma of colon    Tubular adenoma of colon     Past Surgical History:  Procedure Laterality Date   BACK SURGERY     x 3, lumbar   EYE  SURGERY     2008   FINGER SURGERY     TRANSANAL HEMORRHOIDAL DEARTERIALIZATION N/A 03/07/2016   Procedure: TRANSANAL HEMORRHOIDAL DEARTERIALIZATION HEMORRHOIDAL LIGATION/PEXY EXAM UNDER ANESTHESIA WITH  POSSIBLE HEMORRHOIDECTOMY ;  Surgeon: Karie Soda, MD;  Location: [facility];  Service: General;  Laterality: N/A;    Social History   Socioeconomic History   Marital status: Divorced    Spouse name: Not on file   Number of children: 1   Years of education: Not on file   Highest education level: Not on file  Occupational History   Occupation: Merchandiser, retail at [employer]  Tobacco Use   Smoking status: Never   Smokeless tobacco: Never  Vaping Use   Vaping status: Never Used  Substance and Sexual Activity   Alcohol use: No    Alcohol/week: 0.0 standard drinks of alcohol   Drug use: No   Sexual activity: Not on file  Other Topics Concern   Not on file  Social History Narrative   Mr Rosiak lives with his wife in [place], 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       He is originally form [place]         Are you right handed or left handed? Right Handed    Are you currently employed ? 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 Yes   Who lives with you? No one    What type of home do you live in: 1 story or 2 story? Lives in a one story home       Social Determinants of Health   Financial Resource Strain: Not on file  Food Insecurity: Not on file  Transportation Needs: Not on file  Physical Activity: Not on file  Stress: Not on file  Social Connections: Not on file  Intimate Partner Violence: Not on file    Family History  Problem Relation Age of Onset   Dementia Mother    Cancer Father 86       colon   Colon cancer Father    Colon cancer Sister    Hypertension Other    Esophageal cancer Neg Hx    Rectal cancer Neg Hx    Stomach cancer Neg Hx      Review of Systems  Constitutional: Negative.   Negative for chills and fever.  HENT: Negative.  Negative for congestion and sore throat.   Respiratory:  Positive for cough. Negative for shortness of breath.   Cardiovascular: Negative.  Negative for chest pain and palpitations.  Gastrointestinal:  Negative for abdominal pain, nausea and vomiting.  Genitourinary: Negative.  Negative for dysuria and hematuria.  Skin: Negative.  Negative for rash.  Neurological: Negative.  Negative for dizziness and headaches.  All other systems reviewed and are negative.   Vitals:  08/31/23 0957  BP: 124/88  Pulse: 69  Temp: 98.3 F (36.8 C)  SpO2: 98%    Physical Exam Vitals reviewed.  Constitutional:      Appearance: Normal appearance.  HENT:     Head: Normocephalic.     Right Ear: Tympanic membrane, ear canal and external ear normal.     Left Ear: Tympanic membrane, ear canal and external ear normal.     Mouth/Throat:     Mouth: Mucous membranes are moist.     Pharynx: Oropharynx is clear.  Eyes:     Extraocular Movements: Extraocular movements intact.     Conjunctiva/sclera: Conjunctivae normal.     Pupils: Pupils are equal, round, and reactive to light.  Cardiovascular:     Rate and Rhythm: Normal rate and regular rhythm.     Pulses: Normal pulses.     Heart sounds: Normal heart sounds.  Pulmonary:     Effort: Pulmonary effort is normal.     Breath sounds: Normal breath sounds.  Abdominal:     General: There is no distension.     Palpations: Abdomen is soft.     Tenderness: There is no abdominal tenderness.  Musculoskeletal:     Cervical back: No tenderness.  Lymphadenopathy:     Cervical: No cervical adenopathy.  Skin:    General: Skin is warm and dry.     Capillary Refill: Capillary refill takes less than 2 seconds.  Neurological:     General: No focal deficit present.     Mental Status: He is alert and oriented to person, place, and time.  Psychiatric:        Mood and Affect: Mood normal.        Behavior: Behavior  normal.      ASSESSMENT & PLAN: Problem List Items Addressed This Visit       Nervous and Auditory   Neuropathy   Relevant Medications   ibuprofen (ADVIL) 600 MG tablet   Other Visit Diagnoses     Routine general medical examination at a health care facility    -  Primary   Relevant Orders   CBC with Differential   Comprehensive metabolic panel   Hemoglobin A1c   Lipid panel   TSH   PSA(Must document that pt has been informed of limitations of PSA testing.)   Screening for prostate cancer       Relevant Orders   PSA(Must document that pt has been informed of limitations of PSA testing.)   Screening for deficiency anemia       Relevant Orders   CBC with Differential   Screening for lipoid disorders       Relevant Orders   Lipid panel   Screening for endocrine, metabolic and immunity disorder       Relevant Orders   Comprehensive metabolic panel   Hemoglobin A1c   TSH     Modifiable risk factors discussed with patient. Anticipatory guidance according to age provided. The following topics were also discussed: Social Determinants of Health Smoking.  Non-smoker Diet and nutrition Benefits of exercise Cancer screening and review of colonoscopy results done last month Vaccinations reviewed and recommendations Cardiovascular risk assessment and need for blood work The 10-year ASCVD risk score (Arnett DK, et al., 2019) is: 6.5%   Values used to calculate the score:     Age: 37 years     Sex: Male     Is Non-Hispanic African American: Yes     Diabetic: No  Tobacco smoker: No     Systolic Blood Pressure: 124 mmHg     Is BP treated: No     HDL Cholesterol: 79.2 mg/dL     Total Cholesterol: 176 mg/dL  Mental health including depression and anxiety Fall and accident prevention  Patient Instructions  Health Maintenance, Male Adopting a healthy lifestyle and getting preventive care are important in promoting health and wellness. Ask your health care provider  about: The right schedule for you to have regular tests and exams. Things you can do on your own to prevent diseases and keep yourself healthy. What should I know about diet, weight, and exercise? Eat a healthy diet  Eat a diet that includes plenty of vegetables, fruits, low-fat dairy products, and lean protein. Do not eat a lot of foods that are high in solid fats, added sugars, or sodium. Maintain a healthy weight Body mass index (BMI) is a measurement that can be used to identify possible weight problems. It estimates body fat based on height and weight. Your health care provider can help determine your BMI and help you achieve or maintain a healthy weight. Get regular exercise Get regular exercise. This is one of the most important things you can do for your health. Most adults should: Exercise for at least 150 minutes each week. The exercise should increase your heart rate and make you sweat (moderate-intensity exercise). Do strengthening exercises at least twice a week. This is in addition to the moderate-intensity exercise. Spend less time sitting. Even light physical activity can be beneficial. Watch cholesterol and blood lipids Have your blood tested for lipids and cholesterol at 60 years of age, then have this test every 5 years. You may need to have your cholesterol levels checked more often if: Your lipid or cholesterol levels are high. You are older than 60 years of age. You are at high risk for heart disease. What should I know about cancer screening? Many types of cancers can be detected early and may often be prevented. Depending on your health history and family history, you may need to have cancer screening at various ages. This may include screening for: Colorectal cancer. Prostate cancer. Skin cancer. Lung cancer. What should I know about heart disease, diabetes, and high blood pressure? Blood pressure and heart disease High blood pressure causes heart disease and  increases the risk of stroke. This is more likely to develop in people who have high blood pressure readings or are overweight. Talk with your health care provider about your target blood pressure readings. Have your blood pressure checked: Every 3-5 years if you are 88-48 years of age. Every year if you are 37 years old or older. If you are between the ages of 59 and 42 and are a current or former smoker, ask your health care provider if you should have a one-time screening for abdominal aortic aneurysm (AAA). Diabetes Have regular diabetes screenings. This checks your fasting blood sugar level. Have the screening done: Once every three years after age 43 if you are at a normal weight and have a low risk for diabetes. More often and at a younger age if you are overweight or have a high risk for diabetes. What should I know about preventing infection? Hepatitis B If you have a higher risk for hepatitis B, you should be screened for this virus. Talk with your health care provider to find out if you are at risk for hepatitis B infection. Hepatitis C Blood testing is recommended for: Everyone  born from [birth year] through 1965. Anyone with known risk factors for hepatitis C. Sexually transmitted infections (STIs) You should be screened each year for STIs, including gonorrhea and chlamydia, if: You are sexually active and are younger than 60 years of age. You are older than 60 years of age and your health care provider tells you that you are at risk for this type of infection. Your sexual activity has changed since you were last screened, and you are at increased risk for chlamydia or gonorrhea. Ask your health care provider if you are at risk. Ask your health care provider about whether you are at high risk for HIV. Your health care provider may recommend a prescription medicine to help prevent HIV infection. If you choose to take medicine to prevent HIV, you should first get tested for HIV. You should  then be tested every 3 months for as long as you are taking the medicine. Follow these instructions at home: Alcohol use Do not drink alcohol if your health care provider tells you not to drink. If you drink alcohol: Limit how much you have to 0-2 drinks a day. Know how much alcohol is in your drink. In the [place] one drink equals one 12 oz bottle of beer (355 mL), one 5 oz glass of wine (148 mL), or one 1 oz glass of hard liquor (44 mL). Lifestyle Do not use any products that contain nicotine or tobacco. These products include cigarettes, chewing tobacco, and vaping devices, such as e-cigarettes. If you need help quitting, ask your health care provider. Do not use street drugs. Do not share needles. Ask your health care provider for help if you need support or information about quitting drugs. General instructions Schedule regular health, dental, and eye exams. Stay current with your vaccines. Tell your health care provider if: You often feel depressed. You have ever been abused or do not feel safe at home. Summary Adopting a healthy lifestyle and getting preventive care are important in promoting health and wellness. Follow your health care provider's instructions about healthy diet, exercising, and getting tested or screened for diseases. Follow your health care provider's instructions on monitoring your cholesterol and blood pressure. This information is not intended to replace advice given to you by your health care provider. Make sure you discuss any questions you have with your health care provider. Document Revised: 03/04/2021 Document Reviewed: 03/04/2021 Elsevier Patient Education  2024 Elsevier Inc.      Edwina Barth, MD [facility]

## 2023-08-31 NOTE — Patient Instructions (Signed)
Health Maintenance, Male Adopting a healthy lifestyle and getting preventive care are important in promoting health and wellness. Ask your health care provider about: The right schedule for you to have regular tests and exams. Things you can do on your own to prevent diseases and keep yourself healthy. What should I know about diet, weight, and exercise? Eat a healthy diet  Eat a diet that includes plenty of vegetables, fruits, low-fat dairy products, and lean protein. Do not eat a lot of foods that are high in solid fats, added sugars, or sodium. Maintain a healthy weight Body mass index (BMI) is a measurement that can be used to identify possible weight problems. It estimates body fat based on height and weight. Your health care provider can help determine your BMI and help you achieve or maintain a healthy weight. Get regular exercise Get regular exercise. This is one of the most important things you can do for your health. Most adults should: Exercise for at least 150 minutes each week. The exercise should increase your heart rate and make you sweat (moderate-intensity exercise). Do strengthening exercises at least twice a week. This is in addition to the moderate-intensity exercise. Spend less time sitting. Even light physical activity can be beneficial. Watch cholesterol and blood lipids Have your blood tested for lipids and cholesterol at 60 years of age, then have this test every 5 years. You may need to have your cholesterol levels checked more often if: Your lipid or cholesterol levels are high. You are older than 60 years of age. You are at high risk for heart disease. What should I know about cancer screening? Many types of cancers can be detected early and may often be prevented. Depending on your health history and family history, you may need to have cancer screening at various ages. This may include screening for: Colorectal cancer. Prostate cancer. Skin cancer. Lung  cancer. What should I know about heart disease, diabetes, and high blood pressure? Blood pressure and heart disease High blood pressure causes heart disease and increases the risk of stroke. This is more likely to develop in people who have high blood pressure readings or are overweight. Talk with your health care provider about your target blood pressure readings. Have your blood pressure checked: Every 3-5 years if you are 18-39 years of age. Every year if you are 40 years old or older. If you are between the ages of 65 and 75 and are a current or former smoker, ask your health care provider if you should have a one-time screening for abdominal aortic aneurysm (AAA). Diabetes Have regular diabetes screenings. This checks your fasting blood sugar level. Have the screening done: Once every three years after age 45 if you are at a normal weight and have a low risk for diabetes. More often and at a younger age if you are overweight or have a high risk for diabetes. What should I know about preventing infection? Hepatitis B If you have a higher risk for hepatitis B, you should be screened for this virus. Talk with your health care provider to find out if you are at risk for hepatitis B infection. Hepatitis C Blood testing is recommended for: Everyone born from 1945 through 1965. Anyone with known risk factors for hepatitis C. Sexually transmitted infections (STIs) You should be screened each year for STIs, including gonorrhea and chlamydia, if: You are sexually active and are younger than 60 years of age. You are older than 60 years of age and your   health care provider tells you that you are at risk for this type of infection. Your sexual activity has changed since you were last screened, and you are at increased risk for chlamydia or gonorrhea. Ask your health care provider if you are at risk. Ask your health care provider about whether you are at high risk for HIV. Your health care provider  may recommend a prescription medicine to help prevent HIV infection. If you choose to take medicine to prevent HIV, you should first get tested for HIV. You should then be tested every 3 months for as long as you are taking the medicine. Follow these instructions at home: Alcohol use Do not drink alcohol if your health care provider tells you not to drink. If you drink alcohol: Limit how much you have to 0-2 drinks a day. Know how much alcohol is in your drink. In the [place] one drink equals one 12 oz bottle of beer (355 mL), one 5 oz glass of wine (148 mL), or one 1 oz glass of hard liquor (44 mL). Lifestyle Do not use any products that contain nicotine or tobacco. These products include cigarettes, chewing tobacco, and vaping devices, such as e-cigarettes. If you need help quitting, ask your health care provider. Do not use street drugs. Do not share needles. Ask your health care provider for help if you need support or information about quitting drugs. General instructions Schedule regular health, dental, and eye exams. Stay current with your vaccines. Tell your health care provider if: You often feel depressed. You have ever been abused or do not feel safe at home. Summary Adopting a healthy lifestyle and getting preventive care are important in promoting health and wellness. Follow your health care provider's instructions about healthy diet, exercising, and getting tested or screened for diseases. Follow your health care provider's instructions on monitoring your cholesterol and blood pressure. This information is not intended to replace advice given to you by your health care provider. Make sure you discuss any questions you have with your health care provider. Document Revised: 03/04/2021 Document Reviewed: 03/04/2021 Elsevier Patient Education  2024 Elsevier Inc.  

## 2023-09-03 ENCOUNTER — Other Ambulatory Visit: Payer: Self-pay | Admitting: Emergency Medicine

## 2023-09-28 ENCOUNTER — Encounter: Payer: Self-pay | Admitting: Dermatology

## 2023-09-28 ENCOUNTER — Ambulatory Visit: Payer: Managed Care, Other (non HMO) | Admitting: Dermatology

## 2023-09-28 VITALS — BP 122/79 | HR 74

## 2023-09-28 DIAGNOSIS — B351 Tinea unguium: Secondary | ICD-10-CM | POA: Diagnosis not present

## 2023-09-28 DIAGNOSIS — B353 Tinea pedis: Secondary | ICD-10-CM | POA: Diagnosis not present

## 2023-09-28 DIAGNOSIS — L299 Pruritus, unspecified: Secondary | ICD-10-CM

## 2023-09-28 MED ORDER — JUBLIA 10 % EX SOLN: CUTANEOUS | 11 refills | Status: DC

## 2023-09-28 NOTE — Patient Instructions (Addendum)
Dear [name],  Thank you for visiting [place] today. We appreciate your commitment to improving your health and managing your conditions effectively. Here is a summary of the key instructions from today's consultation:  - Jublia for Onychomycosis: Continue applying Jublia daily to all toes to manage your onychomycosis.  - Pharmacy Switch: Transition your prescriptions to [facility] for potentially better pricing and insurance coverage. They will reach out to you to discuss costs after reviewing your insurance details.  - Nystatin Triamcinolone Cream: Use this cream for flare-ups in the groin area, as previously prescribed.   - Application: Apply for a maximum of 10 days as needed.  - Zeasorb Antifungal Powder: Start using Zeasorb powder to prevent flares in the groin area.   - Availability: This can be purchased at [employer], [employer], or [facility].  - Follow-Up: We will see you again in six months to reassess your condition. Should you encounter any issues with [facility] or have other concerns, please communicate with [place] via MyChart.  - Educational Material: You have been provided with educational materials on managing onychomycosis and tinea cruris. We encourage you to review these to better understand your treatment and prevention strategies.  We thank you for your cooperation and proactive approach to your health. We look forward to seeing the progress at your next appointment.  Best regards,  Dr. [facility]    Important Information   Due to recent changes in healthcare laws, you may see results of your pathology and/or laboratory studies on MyChart before the doctors have had a chance to review them. We understand that in some cases there may be results that are confusing or concerning to you. Please understand that not all results are received at the same time and often the doctors may need to interpret multiple results in order to provide you with the best plan of care  or course of treatment. Therefore, we ask that you please give [place] 2 business days to thoroughly review all your results before contacting the office for clarification. Should we see a critical lab result, you will be contacted sooner.     If You Need Anything After Your Visit   If you have any questions or concerns for your doctor, please call our main line at [phone]. If no one answers, please leave a voicemail as directed and we will return your call as soon as possible. Messages left after 4 pm will be answered the following business day.    You may also send [place] a message via MyChart. We typically respond to MyChart messages within 1-2 business days.  For prescription refills, please ask your pharmacy to contact our office. Our fax number is [phone].  If you have an urgent issue when the clinic is closed that cannot wait until the next business day, you can page your doctor at the number below.     Please note that while we do our best to be available for urgent issues outside of office hours, we are not available 24/7.    If you have an urgent issue and are unable to reach [place], you may choose to seek medical care at your doctor's office, retail clinic, urgent care center, or emergency room.   If you have a medical emergency, please immediately call 911 or go to the emergency department. In the event of inclement weather, please call our main line at [phone] for an update on the status of any delays or closures.  Dermatology Medication Tips: Please  keep the boxes that topical medications come in in order to help keep track of the instructions about where and how to use these. Pharmacies typically print the medication instructions only on the boxes and not directly on the medication tubes.   If your medication is too expensive, please contact our office at [phone] or send [place] a message through MyChart.    We are unable to tell what your co-pay for medications will be in  advance as this is different depending on your insurance coverage. However, we may be able to find a substitute medication at lower cost or fill out paperwork to get insurance to cover a needed medication.    If a prior authorization is required to get your medication covered by your insurance company, please allow [place] 1-2 business days to complete this process.   Drug prices often vary depending on where the prescription is filled and some pharmacies may offer cheaper prices.   The website www.goodrx.com contains coupons for medications through different pharmacies. The prices here do not account for what the cost may be with help from insurance (it may be cheaper with your insurance), but the website can give you the price if you did not use any insurance.  - You can print the associated coupon and take it with your prescription to the pharmacy.  - You may also stop by our office during regular business hours and pick up a GoodRx coupon card.  - If you need your prescription sent electronically to a different pharmacy, notify our office through [facility] or by phone at [phone]

## 2023-09-28 NOTE — Progress Notes (Signed)
   Follow-Up Visit   Subjective  [name] is a 62 y.o. male who presents for the following: Rash  Patient present today for follow up visit for rash. Patient was last evaluated on 03/30/23. At his previous visit pt was advised to apply Tacrolimus ointment 2 times daily until rash resolved. Patient reports sxs are better. Patient denies medication changes.  The following portions of the chart were reviewed this encounter and updated as appropriate: medications, allergies, medical history  Review of Systems:  No other skin or systemic complaints except as noted in HPI or Assessment and Plan.  Objective  Well appearing patient in no apparent distress; mood and affect are within normal limits.  A focused examination was performed of the following areas: Toenails and Groins  Relevant exam findings are noted in the Assessment and Plan.    Assessment & Plan   TINEA PEDIS (improved) Exam: Mild scaling at feet.    Patient Education and Treatment Plan: - Use Clotrimazole-Betamethasone cream bid to feet. cream BID x 4 wks as needed for flares.   Use Zeasorb AF powder daily for maintenance for feet and groin area.  Change shoes and keep feet dry.     ONYCHOMYCOSIS (Stable) Exam: Thickened toenails with subungal debris c/w onychomycosis   Chronic and persistent condition with duration or expected duration over one year. Condition is symptomatic/ bothersome to patient. Not currently at goal.   Treatment Plan: - Advised to continue Jublia every night at bedtime to toenails, [facility]ills [facility] to [facility] for price     TINEA CRURIS (Resolved) Exam: Mild hyperpigmentation at B/L Groin   Treatment Plan: - Continue Nystatin-Triamcinolone ointment as needed for flares - Recommended Z absorb AF applied daily for prevenative    Pruritus  Exam: c/o occasional pruritus in groin (not associated with the tinea cruris)   Treatment plan: Start Tacrolimus ointment twice daily to groin,  buttocks, scrotal area until itching has resolved.      Onychomycosis  Related Medications Efinaconazole (JUBLIA) 10 % SOLN Apply every night at bedtime to toenails.    Return in about 6 months (around 03/28/2024) for ONYCHOMYCOSIS and TINEA CRURIS f/U.    Documentation: I have reviewed the above documentation for accuracy and completeness, and I agree with the above.  Stasia Cavalier, am acting as scribe for Langston Reusing, DO.   Langston Reusing, DO

## 2023-09-29 ENCOUNTER — Encounter: Payer: Self-pay | Admitting: Emergency Medicine

## 2023-09-29 ENCOUNTER — Ambulatory Visit (INDEPENDENT_AMBULATORY_CARE_PROVIDER_SITE_OTHER): Payer: Managed Care, Other (non HMO)

## 2023-09-29 ENCOUNTER — Ambulatory Visit: Payer: Managed Care, Other (non HMO) | Admitting: Emergency Medicine

## 2023-09-29 VITALS — BP 128/88 | HR 77 | Temp 98.2°F | Ht 65.0 in | Wt 165.0 lb

## 2023-09-29 DIAGNOSIS — M20011 Mallet finger of right finger(s): Secondary | ICD-10-CM | POA: Diagnosis not present

## 2023-09-29 DIAGNOSIS — S6991XA Unspecified injury of right wrist, hand and finger(s), initial encounter: Secondary | ICD-10-CM

## 2023-09-29 MED ORDER — SILDENAFIL CITRATE 100 MG PO TABS: 50.0000 mg | ORAL_TABLET | Freq: Every day | ORAL | 3 refills | Status: DC | PRN

## 2023-09-29 NOTE — Assessment & Plan Note (Signed)
Clinical fracture.  X-ray done today Report to be reviewed. Fracture instructions given Mallet deformity noted Recommend Ortho evaluation Referral placed today Pain management discussed

## 2023-09-29 NOTE — Assessment & Plan Note (Signed)
Secondary to recent injury.  Possible tendon injury. Recommend orthopedic evaluation Referral placed today.

## 2023-09-29 NOTE — Patient Instructions (Signed)
 Mallet Finger  Mallet finger is an injury that occurs when an object hits the tip of your straightened finger or thumb. It is also known as baseball finger. The blow to your fingertip causes it to bend more than normal, which tears the cord that attaches to the tip of your finger (extensor tendon).  Your extensor tendon is what straightens the end of your finger. If this tendon is damaged, you will not be able to straighten your fingertip. Sometimes, a piece of bone may be pulled away with the tendon (avulsion injury), or the tendon may tear completely. In some cases, surgery may be required to repair the damage. What are the causes? Mallet finger is caused by a hard, direct hit to the tip of your finger or thumb. This injury often happens from getting hit in the finger with a hard ball, such as a baseball. What increases the risk? This injury is more likely to happen if you play a sport that uses a hard ball. What are the signs or symptoms? The main symptom of this injury is the inability to straighten the tip of your finger. You can manually straighten your fingertip with your other hand, but the finger cannot straighten on its own. Other symptoms may include: Pain. Swelling. Bruising. Blood under the fingernail. How is this diagnosed? Your health care provider may suspect mallet finger if you are not able to extend your fingertip, especially if you recently injured your hand. Your health care provider will do a physical exam. This may include X-rays to see if a piece of bone has been pulled away or if the finger joint has separated (dislocated). How is this treated? Mallet finger may be treated with: A splint on your fingertip to keep it straight (extended) while the tendon heals. Surgery to repair the tendon. This is done in severe cases. This may involve: Using a pin or screw to keep your finger extended and your tendon attached. Using a piece of tendon from another part of your body  (graft) to replace a torn tendon. Follow these instructions at home: If you have a removable splint: Wear the splint as told by your health care provider. Remove it only as told by your health care provider. Check the skin around the splint every day. Tell your health care provider about any concerns. Loosen the splint if your fingers tingle, become numb, or turn cold and blue. Keep the splint clean. If the splint is not waterproof: Do not let it get wet. Cover it with a watertight covering when you take a bath or a shower. If you remove your splint to dry it or change it: Gently press your finger on a flat surface to keep it straight. Failing to do so may lead to a permanent injury or force you to wear the splint for a longer period of time. Check the skin under the splint. Tell your health care provider if you notice a blister or red and raw skin. Managing pain, stiffness, and swelling  If directed, put ice on the injured area. To do this: If you have a removable splint, remove it as told by your health care provider. Put ice in a plastic bag. Place a towel between your skin and the bag. Leave the ice on for 20 minutes, 2-3 times a day. Remove the ice if your skin turns bright red. This is very important. If you cannot feel pain, heat, or cold, you have a greater risk of damage to the area.  Move your fingers often to reduce stiffness and swelling. Raise (elevate)the injured area above the level of your heart while you are sitting or lying down. General instructions Take over-the-counter and prescription medicines only as told by your health care provider. Ask your health care provider if the medicine prescribed to you requires you to avoid driving or using machinery. Keep all follow-up visits. This is important. Contact a health care provider if: You have pain or swelling that is getting worse. Your finger feels cold. You cannot extend your finger after treatment. You notice that the  skin under the splint is red, raw, or has a blister. Get help right away if: Even after loosening your splint, your finger is: Very red and swollen. White or blue. Numb or tingling. Summary Mallet finger is an injury that occurs from a hard, direct hit to the tip of your finger or thumb. The blow to your fingertip causes it to bend more than normal, tearing the tendon that straightens the end of your finger. You cannot straighten your fingertip if this tendon is torn. This injury often happens from getting hit in the finger with a hard ball, such as a baseball. Treatment will depend on how severe the injury is. You may need to wear a splint to keep the finger straight while it heals. A more severe injury may require surgery to repair the tendon. This information is not intended to replace advice given to you by your health care provider. Make sure you discuss any questions you have with your health care provider. Document Revised: 09/05/2020 Document Reviewed: 09/05/2020 Elsevier Patient Education  2024 [employer].

## 2023-09-29 NOTE — Progress Notes (Unsigned)
[name] 62 y.o.   Chief Complaint  Patient presents with  . Hand Pain    On 09/23/23 pt states his finger he got into a little scuffle over a cart with someone while he was working and when the other person let go of the cart it smashed his finger between the cart and wall/door. Hurts to move, swelling    HISTORY OF PRESENT ILLNESS: Acute problem visit today This is a 62 y.o. male complaining of right index finger injury sustained on 09/23/2023 Complaining of pain and swelling No other injuries.  No other complaints or medical concerns today.  Hand Pain  Pertinent negatives include no chest pain.     Prior to Admission medications   Medication Sig Start Date End Date Taking? Authorizing Provider  atorvastatin (LIPITOR) 20 MG tablet TAKE 1 TABLET BY MOUTH DAILY 01/06/23  Yes Eastyn Dattilo, Eilleen Kempf, MD  clindamycin-benzoyl peroxide [facility]) gel Apply topically 2 (two) times daily. 05/28/20  Yes Janeece Agee, NP  clotrimazole-betamethasone (LOTRISONE) cream Apply twice daily to feet as needed for flares of rash 03/30/23  Yes Terri Piedra, DO  diclofenac Sodium (VOLTAREN) 1 % GEL Apply 2 g topically 4 (four) times daily. 08/03/20  Yes Lezlie Lye, Irma M, MD  Efinaconazole (JUBLIA) 10 % SOLN Apply every night at bedtime to toenails. 09/28/23  Yes Terri Piedra, DO  escitalopram (LEXAPRO) 10 MG tablet Take 10 mg by mouth daily.   Yes [provider]  fluticasone (FLONASE) 50 MCG/ACT nasal spray Place 1 spray into both nostrils 2 (two) times daily. 08/31/23  Yes Jakyra Kenealy, Eilleen Kempf, MD  gabapentin (NEURONTIN) 300 MG capsule Take 1 tablet x 1 week, then increase 2 tablet at bedtime. 11/11/22  Yes Patel, Donika K, DO  griseofulvin (GRIS-PEG) 250 MG tablet Take 1 tablet (250 mg total) by mouth daily. 02/19/23  Yes Terri Piedra, DO  ibuprofen (ADVIL) 600 MG tablet Take 1 tablet (600 mg total) by mouth every 8 (eight) hours as needed. 08/31/23  Yes Georgina Quint, MD  mirtazapine (REMERON) 45 MG tablet  06/02/17  Yes [provider]  Multiple Vitamin (MULTIVITAMIN) tablet Take 1 tablet by mouth daily.   Yes [provider]  nystatin-triamcinolone ointment (MYCOLOG) Apply topically. 09/04/23  Yes [provider]  omeprazole (PRILOSEC) 40 MG capsule Take 1 capsule (40 mg total) by mouth daily. 05/12/23  Yes Pyrtle, Carie Caddy, MD  potassium chloride (KLOR-CON M) 10 MEQ tablet Take 1 tablet (10 mEq total) by mouth daily. 08/25/22  Yes Aston Lieske, Eilleen Kempf, MD  Prucalopride Succinate (MOTEGRITY) 2 MG TABS Take 1 tablet (2 mg total) by mouth daily. 05/12/23  Yes Pyrtle, Carie Caddy, MD  tacrolimus (PROTOPIC) 0.1 % ointment Apply topically 2 (two) times daily. 03/30/23  Yes Terri Piedra, DO  Travoprost (TRAVATAN OP) Apply to eye at bedtime.   Yes [provider]  Travoprost, BAK Free, (TRAVATAN) 0.004 % SOLN ophthalmic solution  09/28/20  Yes [provider]  sildenafil (VIAGRA) 100 MG tablet Take 0.5-1 tablets (50-100 mg total) by mouth daily as needed for erectile dysfunction. 09/29/23   Georgina Quint, MD    No Known Allergies  Patient Active Problem List   Diagnosis Date Noted  . Neuropathy 05/09/2022  . Hemorrhoids 05/09/2022  . Prediabetes 03/19/2022  . Murmur 01/30/2022  . History of fusion of lumbar spine 09/24/2021  . Spinal stenosis of lumbar region 09/24/2021  . Depression 08/16/2015  . Erectile dysfunction 04/25/2015  .  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 HYPERCHOLESTEROLEMIA 03/11/2010  . Unspecified glaucoma 09/03/2009  . THYROID NODULE 08/28/2008  . BIPOLAR DISORDER UNSPECIFIED 12/13/2007    Past Medical History:  Diagnosis Date  . Anxiety   . ARM PAIN, LEFT 11/20/2009  . Arthritis   . BIPOLAR DISORDER UNSPECIFIED 12/13/2007   patient denies  . Blood transfusion without reported diagnosis   . CHEST PAIN 03/23/2009   currently having   . DEPRESSION 12/13/2007  . FATIGUE 03/23/2009  .  GERD (gastroesophageal reflux disease)   . GLAUCOMA 09/03/2009  . Headache   . HEMORRHOIDS 09/03/2009  . HOARSENESS 09/03/2009  . HYPERCHOLESTEROLEMIA 03/11/2010  . MYALGIA 08/13/2009  . NECK PAIN 01/18/2009  . Palpitations 03/23/2009  . Polydipsia 01/24/2008  . Shortness of breath 03/23/2009  . THYROID NODULE 08/28/2008   benign on Bx  . THYROIDITIS 12/13/2007  . Tubular adenoma of colon   . Tubular adenoma of colon     Past Surgical History:  Procedure Laterality Date  . BACK SURGERY     x 3, lumbar  . EYE SURGERY     2008  . FINGER SURGERY    . TRANSANAL HEMORRHOIDAL DEARTERIALIZATION N/A 03/07/2016   Procedure: TRANSANAL HEMORRHOIDAL DEARTERIALIZATION HEMORRHOIDAL LIGATION/PEXY EXAM UNDER ANESTHESIA WITH  POSSIBLE HEMORRHOIDECTOMY ;  Surgeon: Karie Soda, MD;  Location: [facility];  Service: General;  Laterality: N/A;    Social History   Socioeconomic History  . Marital status: Divorced    Spouse name: Not on file  . Number of children: 1  . Years of education: Not on file  . Highest education level: Not on file  Occupational History  . Occupation: Merchandiser, retail at [employer]  . Smoking status: Never  . Smokeless tobacco: Never  Vaping Use  . Vaping status: Never Used  Substance and Sexual Activity  . Alcohol use: No    Alcohol/week: 0.0 standard drinks of alcohol  . Drug use: No  . Sexual activity: Not on file  Other Topics Concern  . 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       He is originally form [place]         Are you right handed or left handed? Right Handed    Are you currently employed ? 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 Yes   Who lives with you? No one    What type of home do you live in: 1 story or 2 story? Lives in a one story home       Social Determinants of Health    Financial Resource Strain: Medium Risk (09/29/2023)   Overall Financial Resource Strain (CARDIA)   . Difficulty of Paying Living Expenses: Somewhat hard  Food Insecurity: Patient Declined (09/29/2023)   Hunger Vital Sign   . Worried About Programme researcher, broadcasting/film/video in the Last Year: Patient declined   . Ran Out of Food in the Last Year: Patient declined  Transportation Needs: Not on file  Physical Activity: Not on file  Stress: No Stress Concern Present (09/29/2023)   [employer] of Occupational Health - Occupational Stress Questionnaire   . Feeling of Stress : Only a little  Social Connections: Unknown (09/29/2023)   Social Connection and Isolation Panel [NHANES]   . Frequency of Communication with  Friends and Family: Twice a week   . Frequency of Social Gatherings with Friends and Family: Once a week   . Attends Religious Services: Patient declined   . Active Member of Clubs or Organizations: Patient declined   . Attends Banker Meetings: Not on file   . Marital Status: Divorced  Catering manager Violence: Not on file    Family History  Problem Relation Age of Onset  . Dementia Mother   . Cancer Father 36       colon  . Colon cancer Father   . Colon cancer Sister   . Hypertension Other   . Esophageal cancer Neg Hx   . Rectal cancer Neg Hx   . Stomach cancer Neg Hx      Review of Systems  Constitutional: Negative.  Negative for chills and fever.  HENT: Negative.  Negative for congestion and sore throat.   Respiratory: Negative.  Negative for cough and shortness of breath.   Cardiovascular: Negative.  Negative for chest pain.  Gastrointestinal:  Negative for abdominal pain, nausea and vomiting.  Skin: Negative.  Negative for rash.  Neurological: Negative.  Negative for dizziness and headaches.  All other systems reviewed and are negative.   Vitals:   09/29/23 1541  BP: 128/88  Pulse: 77  Temp: 98.2 F (36.8 C)  SpO2: 95%    Physical Exam Vitals  reviewed.  Constitutional:      Appearance: Normal appearance.  HENT:     Head: Normocephalic.  Eyes:     Extraocular Movements: Extraocular movements intact.  Cardiovascular:     Rate and Rhythm: Normal rate.  Pulmonary:     Effort: Pulmonary effort is normal.  Musculoskeletal:     Comments: Right index finger: Mallet deformity with swelling and tenderness to DIP joint area  Skin:    General: Skin is warm and dry.  Neurological:     Mental Status: He is alert and oriented to person, place, and time.  Psychiatric:        Mood and Affect: Mood normal.        Behavior: Behavior normal.     ASSESSMENT & PLAN: A total of 32 minutes was spent with the patient and counseling/coordination of care regarding preparing for this visit, review of most recent office visit notes, diagnosis of possible finger fracture and need for orthopedic evaluation, findings of mallet finger and possible tendon injury, pain management, review of x-ray done today, prognosis, documentation, and need for follow-up  Problem List Items Addressed This Visit       Musculoskeletal and Integument   Mallet finger of right hand    Secondary to recent injury.  Possible tendon injury. Recommend orthopedic evaluation Referral placed today.      Relevant Orders   Ambulatory referral to Orthopedic Surgery     Other   Injury of finger of right hand - Primary    Clinical fracture.  X-ray done today Report to be reviewed. Fracture instructions given Mallet deformity noted Recommend Ortho evaluation Referral placed today Pain management discussed      Relevant Orders   DG Finger Index Right   Ambulatory referral to Orthopedic Surgery   Patient Instructions  Mallet Finger  Mallet finger is an injury that occurs when an object hits the tip of your straightened finger or thumb. It is also known as baseball finger. The blow to your fingertip causes it to bend more than normal, which tears the cord that attaches  to the  tip of your finger (extensor tendon).  Your extensor tendon is what straightens the end of your finger. If this tendon is damaged, you will not be able to straighten your fingertip. Sometimes, a piece of bone may be pulled away with the tendon (avulsion injury), or the tendon may tear completely. In some cases, surgery may be required to repair the damage. What are the causes? Mallet finger is caused by a hard, direct hit to the tip of your finger or thumb. This injury often happens from getting hit in the finger with a hard ball, such as a baseball. What increases the risk? This injury is more likely to happen if you play a sport that uses a hard ball. What are the signs or symptoms? The main symptom of this injury is the inability to straighten the tip of your finger. You can manually straighten your fingertip with your other hand, but the finger cannot straighten on its own. Other symptoms may include: Pain. Swelling. Bruising. Blood under the fingernail. How is this diagnosed? Your health care provider may suspect mallet finger if you are not able to extend your fingertip, especially if you recently injured your hand. Your health care provider will do a physical exam. This may include X-rays to see if a piece of bone has been pulled away or if the finger joint has separated (dislocated). How is this treated? Mallet finger may be treated with: A splint on your fingertip to keep it straight (extended) while the tendon heals. Surgery to repair the tendon. This is done in severe cases. This may involve: Using a pin or screw to keep your finger extended and your tendon attached. Using a piece of tendon from another part of your body (graft) to replace a torn tendon. Follow these instructions at home: If you have a removable splint: Wear the splint as told by your health care provider. Remove it only as told by your health care provider. Check the skin around the splint every day. Tell  your health care provider about any concerns. Loosen the splint if your fingers tingle, become numb, or turn cold and blue. Keep the splint clean. If the splint is not waterproof: Do not let it get wet. Cover it with a watertight covering when you take a bath or a shower. If you remove your splint to dry it or change it: Gently press your finger on a flat surface to keep it straight. Failing to do so may lead to a permanent injury or force you to wear the splint for a longer period of time. Check the skin under the splint. Tell your health care provider if you notice a blister or red and raw skin. Managing pain, stiffness, and swelling  If directed, put ice on the injured area. To do this: If you have a removable splint, remove it as told by your health care provider. Put ice in a plastic bag. Place a towel between your skin and the bag. Leave the ice on for 20 minutes, 2-3 times a day. Remove the ice if your skin turns bright red. This is very important. If you cannot feel pain, heat, or cold, you have a greater risk of damage to the area. Move your fingers often to reduce stiffness and swelling. Raise (elevate)the injured area above the level of your heart while you are sitting or lying down. General instructions Take over-the-counter and prescription medicines only as told by your health care provider. Ask your health care provider if the medicine  prescribed to you requires you to avoid driving or using machinery. Keep all follow-up visits. This is important. Contact a health care provider if: You have pain or swelling that is getting worse. Your finger feels cold. You cannot extend your finger after treatment. You notice that the skin under the splint is red, raw, or has a blister. Get help right away if: Even after loosening your splint, your finger is: Very red and swollen. White or blue. Numb or tingling. Summary Mallet finger is an injury that occurs from a hard, direct hit  to the tip of your finger or thumb. The blow to your fingertip causes it to bend more than normal, tearing the tendon that straightens the end of your finger. You cannot straighten your fingertip if this tendon is torn. This injury often happens from getting hit in the finger with a hard ball, such as a baseball. Treatment will depend on how severe the injury is. You may need to wear a splint to keep the finger straight while it heals. A more severe injury may require surgery to repair the tendon. This information is not intended to replace advice given to you by your health care provider. Make sure you discuss any questions you have with your health care provider. Document Revised: 09/05/2020 Document Reviewed: 09/05/2020 Elsevier Patient Education  2024 Elsevier Inc.      Edwina Barth, MD [facility]

## 2023-09-30 ENCOUNTER — Other Ambulatory Visit: Payer: Self-pay | Admitting: Emergency Medicine

## 2023-10-06 ENCOUNTER — Telehealth: Payer: Self-pay | Admitting: Orthopedic Surgery

## 2023-10-06 NOTE — Telephone Encounter (Signed)
Called pt Moved appt from 9 to 11:45 provider will be in surgery due to complications LVM for pt to callback if needs RS. Please advise if pt calls back

## 2023-10-09 ENCOUNTER — Ambulatory Visit: Payer: Managed Care, Other (non HMO) | Admitting: Orthopedic Surgery

## 2023-10-09 ENCOUNTER — Other Ambulatory Visit (INDEPENDENT_AMBULATORY_CARE_PROVIDER_SITE_OTHER): Payer: Self-pay

## 2023-10-09 ENCOUNTER — Ambulatory Visit (INDEPENDENT_AMBULATORY_CARE_PROVIDER_SITE_OTHER): Payer: Managed Care, Other (non HMO) | Admitting: Orthopedic Surgery

## 2023-10-09 DIAGNOSIS — M20011 Mallet finger of right finger(s): Secondary | ICD-10-CM

## 2023-10-09 DIAGNOSIS — S60410D Abrasion of right index finger, subsequent encounter: Secondary | ICD-10-CM

## 2023-10-09 NOTE — Progress Notes (Unsigned)
[name] - 62 y.o. male MRN [id number]  Date of birth: 06-Oct-1961  Office Visit Note: Visit Date: 10/09/2023 PCP: Georgina Quint, MD Referred by: Georgina Quint, *  Subjective: No chief complaint on file.  HPI: Drayden Cleckler is a pleasant 62 y.o. male who presents today for evaluation of a right index finger bony mallet injury sustained approximately 2 weeks prior.  He was seen by his primary care doctor and placed into a splint approximately 6 days after his injury.  He has been compliant with the finger splint as instructed.  Pertinent ROS were reviewed with the patient and found to be negative unless otherwise specified above in HPI.   Visit Reason: right index finger bony mallet Duration of symptoms: 09/23/23 Hand dominance: right Occupation:[employer] Diabetic: No Smoking: No Heart/Lung History: none Blood Thinners: none  Prior Testing/EMG: xray 09/29/23 Injections (Date): none Treatments: splint Prior Surgery: none  Assessment & Plan: Visit Diagnoses:  1. 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 Abrasion of right index finger, subsequent encounter     Plan: Extensive discussion was had the patient today regarding his right index finger bony mallet injury.  Repeat x-rays were performed today which do show stable appearance of the DIP joint without evidence of volar subluxation or significant bony displacement.  We will transition him to a mallet splint in order to allow motion of the PIP joint of the digit as this is becoming quite stiff.  I did emphasize for him the importance of compliance with the splint to allow for appropriate healing.  We discussed the timeline for healing is roughly 8 weeks, meaning that he will need to continue the use of the splint for an additional 6 weeks full-time.  We did discuss the possibility for subsequent displacement or volar subluxation of the bony mallet injury in the future which would necessitate  surgical fixation and pinning.  For the time being, his fracture does demonstrate appropriate stability and we can continue with nonoperative measures.  I will plan on seeing him back in roughly 3 to 4 weeks for a repeat clinical and radiographic check.  He expressed full understanding.  X-rays were reviewed in detail with the patient as well.  Follow-up: No follow-ups on file.   Meds & Orders: No orders of the defined types were placed in this encounter.   Orders Placed This Encounter  Procedures   XR Finger Index Right     Procedures: No procedures performed      Clinical History: No specialty comments available.  He reports that he has never smoked. He has never used smokeless tobacco.  Recent Labs    08/31/23 1042  HGBA1C 6.2    Objective:   Vital Signs: There were no vitals taken for this visit.  Physical Exam  Gen: Well-appearing, in no acute distress; non-toxic CV: Regular Rate. Well-perfused. Warm.  Resp: Breathing unlabored on room air; no wheezing. Psych: Fluid speech in conversation; appropriate affect; normal thought process  Ortho Exam Right index finger: - Significant tenderness over the DIP region, skin is intact, there is notable swelling in this area - PIP range of motion 0-60, slightly improved passively - Digit remains warm well-perfused, sensation is intact distally with appropriate capillary refill  Imaging: X-rays of the index finger, multiple views were obtained X-rays demonstrate bony mallet injury of the distal phalanx, without significant displacement.  There is no evidence of volar subluxation of the distal phalanx, DIP remains well located  in all planes.  Past Medical/Family/Surgical/Social History: Medications & Allergies reviewed per EMR, new medications updated. Patient Active Problem List   Diagnosis Date Noted   Injury of finger of right hand 09/29/2023   Mallet finger of right hand 09/29/2023   Neuropathy 05/09/2022   Hemorrhoids  05/09/2022   Prediabetes 03/19/2022   Murmur 01/30/2022   History of fusion of lumbar spine 09/24/2021   Spinal stenosis of lumbar region 09/24/2021   Depression 08/16/2015   Erectile dysfunction 04/25/2015   Disorder of liver 10/13/2011   HYPERCHOLESTEROLEMIA 03/11/2010   Unspecified glaucoma 09/03/2009   THYROID NODULE 08/28/2008   BIPOLAR DISORDER UNSPECIFIED 12/13/2007   Past Medical History:  Diagnosis Date   Anxiety    ARM PAIN, LEFT 11/20/2009   Arthritis    BIPOLAR DISORDER UNSPECIFIED 12/13/2007   patient denies   Blood transfusion without reported diagnosis    CHEST PAIN 03/23/2009   currently having    DEPRESSION 12/13/2007   FATIGUE 03/23/2009   GERD (gastroesophageal reflux disease)    GLAUCOMA 09/03/2009   Headache    HEMORRHOIDS 09/03/2009   HOARSENESS 09/03/2009   HYPERCHOLESTEROLEMIA 03/11/2010   MYALGIA 08/13/2009   NECK PAIN 01/18/2009   Palpitations 03/23/2009   Polydipsia 01/24/2008   Shortness of breath 03/23/2009   THYROID NODULE 08/28/2008   benign on Bx   THYROIDITIS 12/13/2007   Tubular adenoma of colon    Tubular adenoma of colon    Family History  Problem Relation Age of Onset   Dementia Mother    Cancer Father 70       colon   Colon cancer Father    Colon cancer Sister    Hypertension Other    Esophageal cancer Neg Hx    Rectal cancer Neg Hx    Stomach cancer Neg Hx    Past Surgical History:  Procedure Laterality Date   BACK SURGERY     x 3, lumbar   EYE SURGERY     2008   FINGER SURGERY     TRANSANAL HEMORRHOIDAL DEARTERIALIZATION N/A 03/07/2016   Procedure: TRANSANAL HEMORRHOIDAL DEARTERIALIZATION HEMORRHOIDAL LIGATION/PEXY EXAM UNDER ANESTHESIA WITH  POSSIBLE HEMORRHOIDECTOMY ;  Surgeon: Karie Soda, MD;  Location: [facility];  Service: General;  Laterality: N/A;   Social History   Occupational History   Occupation: Merchandiser, retail at [employer]  Tobacco Use   Smoking status: Never   Smokeless tobacco: Never  Vaping Use    Vaping status: Never Used  Substance and Sexual Activity   Alcohol use: No    Alcohol/week: 0.0 standard drinks of alcohol   Drug use: No   Sexual activity: Not on file    Alayshia Marini Trevor Mace, M.D.  [facility] 2:03 PM

## 2023-10-12 ENCOUNTER — Telehealth: Payer: Self-pay | Admitting: Radiology

## 2023-10-12 NOTE — Telephone Encounter (Signed)
LVM for patient in regards to splint. Take off the one we gave him and put on the one he had He needs to make sure he keeps finger straight at all times.

## 2023-10-12 NOTE — Telephone Encounter (Signed)
Patient called triage stating the splint that you put on his finger last Friday is causing a large increase in pain. He did not have this pain with the splint he bought previously, but now seems to be hurting badly. He would like to know what you would like for him to do?  CB [phone]

## 2023-11-04 ENCOUNTER — Other Ambulatory Visit (INDEPENDENT_AMBULATORY_CARE_PROVIDER_SITE_OTHER): Payer: Self-pay

## 2023-11-04 ENCOUNTER — Ambulatory Visit: Payer: Managed Care, Other (non HMO) | Admitting: Orthopedic Surgery

## 2023-11-04 DIAGNOSIS — M20011 Mallet finger of right finger(s): Secondary | ICD-10-CM

## 2023-11-04 NOTE — Progress Notes (Signed)
 [name] - 62 y.o. male MRN [id number]  Date of birth: 1961/10/06  Office Visit Note: Visit Date: 11/04/2023 PCP: Purcell Emil Schanz, MD Referred by: Purcell Emil Schanz, *  Subjective: No chief complaint on file.  HPI: [name] is a pleasant 64 y.o. male who presents today for follow up of a right index finger bony mallet injury, injury approximately 3 weeks old.  He has been compliant with the mallet splint as instructed.  Pertinent ROS were reviewed with the patient and found to be negative unless otherwise specified above in HPI.     Assessment & Plan: Visit Diagnoses:  1. 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  Repeat x-rays were performed today which do show stable appearance of the DIP joint without evidence of volar subluxation or significant bony displacement.  Continue with mallet splint for additional 4 weeks.  New splint was fitted today. Encouraged to use splint full time, maintain finger extension during hygiene and hand washing when out of splint.    I will plan on seeing him back in 4 weeks for a repeat clinical and radiographic check, can likely discontinue splint at that time.   Follow-up: No follow-ups on file.   Meds & Orders: No orders of the defined types were placed in this encounter.   Orders Placed This Encounter  Procedures   XR Finger Index Right     Procedures: No procedures performed      Clinical History: No specialty comments available.  He reports that he has never smoked. He has never used smokeless tobacco.  Recent Labs    08/31/23 1042  HGBA1C 6.2    Objective:   Vital Signs: There were no vitals taken for this visit.  Physical Exam  Gen: Well-appearing, in no acute distress; non-toxic CV: Regular Rate. Well-perfused. Warm.  Resp: Breathing unlabored on room air; no wheezing. Psych: Fluid  speech in conversation; appropriate affect; normal thought process  Ortho Exam Right index finger: - Minimal tenderness over the DIP region, skin is intact - PIP range of motion 0-80 - Digit remains warm well-perfused, sensation is intact distally with appropriate capillary refill  Imaging: X-rays of the index finger, multiple views were obtained X-rays demonstrate bony mallet injury of the distal phalanx, without significant displacement.  There is no evidence of volar subluxation of the distal phalanx, DIP remains well located in all planes.  Past Medical/Family/Surgical/Social History: Medications & Allergies reviewed per EMR, new medications updated. Patient Active Problem List   Diagnosis Date Noted   Injury of finger of right hand 09/29/2023   Mallet finger of right hand 09/29/2023   Neuropathy 05/09/2022   Hemorrhoids 05/09/2022   Prediabetes 03/19/2022   Murmur 01/30/2022   History of fusion of lumbar spine 09/24/2021   Spinal stenosis of lumbar region 09/24/2021   Depression 08/16/2015   Erectile dysfunction 04/25/2015   Disorder of liver 10/13/2011   HYPERCHOLESTEROLEMIA 03/11/2010   Unspecified glaucoma 09/03/2009   THYROID  NODULE 08/28/2008   BIPOLAR DISORDER UNSPECIFIED 12/13/2007   Past Medical History:  Diagnosis Date   Anxiety    ARM PAIN, LEFT 11/20/2009   Arthritis    BIPOLAR DISORDER UNSPECIFIED 12/13/2007   patient denies   Blood transfusion without reported diagnosis    CHEST PAIN 03/23/2009   currently having    DEPRESSION 12/13/2007   FATIGUE 03/23/2009   GERD (gastroesophageal  reflux disease)    GLAUCOMA 09/03/2009   Headache    HEMORRHOIDS 09/03/2009   HOARSENESS 09/03/2009   HYPERCHOLESTEROLEMIA 03/11/2010   MYALGIA 08/13/2009   NECK PAIN 01/18/2009   Palpitations 03/23/2009   Polydipsia 01/24/2008   Shortness of breath 03/23/2009   THYROID  NODULE 08/28/2008   benign on Bx   THYROIDITIS 12/13/2007   Tubular adenoma of colon     Tubular adenoma of colon    Family History  Problem Relation Age of Onset   Dementia Mother    Cancer Father 28       colon   Colon cancer Father    Colon cancer Sister    Hypertension Other    Esophageal cancer Neg Hx    Rectal cancer Neg Hx    Stomach cancer Neg Hx    Past Surgical History:  Procedure Laterality Date   BACK SURGERY     x 3, lumbar   EYE SURGERY     2008   FINGER SURGERY     TRANSANAL HEMORRHOIDAL DEARTERIALIZATION N/A 03/07/2016   Procedure: TRANSANAL HEMORRHOIDAL DEARTERIALIZATION HEMORRHOIDAL LIGATION/PEXY EXAM UNDER ANESTHESIA WITH  POSSIBLE HEMORRHOIDECTOMY ;  Surgeon: Elspeth Schultze, MD;  Location: [facility];  Service: General;  Laterality: N/A;   Social History   Occupational History   Occupation: Merchandiser, Retail at [employer]  Tobacco Use   Smoking status: Never   Smokeless tobacco: Never  Vaping Use   Vaping status: Never Used  Substance and Sexual Activity   Alcohol use: No    Alcohol/week: 0.0 standard drinks of alcohol   Drug use: No   Sexual activity: Not on file    Simya Tercero Afton Alderton, M.D. [facility] 6:39 PM

## 2023-11-16 ENCOUNTER — Ambulatory Visit: Payer: Managed Care, Other (non HMO) | Admitting: Orthopedic Surgery

## 2023-11-27 ENCOUNTER — Other Ambulatory Visit: Payer: Self-pay | Admitting: Dermatology

## 2023-12-02 ENCOUNTER — Ambulatory Visit: Payer: Managed Care, Other (non HMO) | Admitting: Emergency Medicine

## 2023-12-02 ENCOUNTER — Encounter: Payer: Self-pay | Admitting: Emergency Medicine

## 2023-12-02 VITALS — BP 122/78 | HR 69 | Temp 98.1°F | Ht 65.0 in | Wt 169.0 lb

## 2023-12-02 DIAGNOSIS — J22 Unspecified acute lower respiratory infection: Secondary | ICD-10-CM | POA: Diagnosis not present

## 2023-12-02 DIAGNOSIS — R051 Acute cough: Secondary | ICD-10-CM | POA: Diagnosis not present

## 2023-12-02 LAB — POCT INFLUENZA A/B
Influenza A, POC: NEGATIVE
Influenza B, POC: NEGATIVE

## 2023-12-02 LAB — POC COVID19 BINAXNOW: SARS Coronavirus 2 Ag: NEGATIVE

## 2023-12-02 MED ORDER — AZITHROMYCIN 250 MG PO TABS: ORAL_TABLET | ORAL | 0 refills | Status: AC

## 2023-12-02 MED ORDER — BENZONATATE 200 MG PO CAPS: 200.0000 mg | ORAL_CAPSULE | Freq: Two times a day (BID) | ORAL | 0 refills | Status: DC | PRN

## 2023-12-02 NOTE — Patient Instructions (Signed)

## 2023-12-02 NOTE — Assessment & Plan Note (Signed)
 Clinically stable.  No signs of pneumonia. Negative COVID and flu tests Recommend daily azithromycin  for 5 days Symptom management discussed Advised to rest and stay well-hydrated Recommend over-the-counter Mucinex dm with cough drops Advised to contact the office if no better or worse during the next several days

## 2023-12-02 NOTE — Progress Notes (Signed)
 [name] 62 y.o.   Chief Complaint  Patient presents with   Cough    Patient states started Saturday, cough, nasal congestion, pressure on his chest at night. Pt was theraflu. Had a fever only on Sunday, no sore throat    HISTORY OF PRESENT ILLNESS: This is a 63 y.o. male complaining of flulike symptoms that started last Saturday, 5 days ago Still having productive persistent cough with nasal congestion and chest congestion No longer having a fever.  No other associated symptoms No other complaints or medical concerns today.  Cough Associated symptoms include a fever. Pertinent negatives include no chest pain, headaches, rash or sore throat.     Prior to Admission medications   Medication Sig Start Date End Date Taking? Authorizing Provider  atorvastatin  (LIPITOR) 20 MG tablet TAKE 1 TABLET BY MOUTH DAILY 09/30/23  Yes Secundino Ellithorpe, Emil Schanz, MD  azithromycin  (ZITHROMAX ) 250 MG tablet Sig as indicated 12/02/23  Yes Kemarion Abbey, Emil Schanz, MD  benzonatate  (TESSALON ) 200 MG capsule Take 1 capsule (200 mg total) by mouth 2 (two) times daily as needed for cough. 12/02/23  Yes Winnie Barsky Jose, MD  clindamycin -benzoyl peroxide (BENZACLIN) gel Apply topically 2 (two) times daily. 05/28/20  Yes Kip Ade, NP  clotrimazole -betamethasone  (LOTRISONE ) cream Apply twice daily to feet as needed for flares of rash 03/30/23  Yes Alm Delon SAILOR, DO  diclofenac  Sodium (VOLTAREN ) 1 % GEL Apply 2 g topically 4 (four) times daily. 08/03/20  Yes Melonie Colonel, Mikel HERO, MD  Efinaconazole  (JUBLIA ) 10 % SOLN Apply every night at bedtime to toenails. 09/28/23  Yes Alm Delon SAILOR, DO  escitalopram (LEXAPRO) 10 MG tablet Take 10 mg by mouth daily.   Yes [provider]  fluticasone  (FLONASE ) 50 MCG/ACT nasal spray Place 1 spray into both nostrils 2 (two) times daily. 08/31/23  Yes Lera Gaines, Emil Schanz, MD  griseofulvin  (GRIS-PEG ) 250 MG tablet Take 1 tablet (250 mg total) by mouth daily.  02/19/23  Yes Alm Delon SAILOR, DO  ibuprofen  (ADVIL ) 600 MG tablet Take 1 tablet (600 mg total) by mouth every 8 (eight) hours as needed. 08/31/23  Yes Purcell Emil Schanz, MD  mirtazapine (REMERON) 45 MG tablet  06/02/17  Yes [provider]  Multiple Vitamin (MULTIVITAMIN) tablet Take 1 tablet by mouth daily.   Yes [provider]  nystatin -triamcinolone  ointment (MYCOLOG) Apply topically. 09/04/23  Yes [provider]  omeprazole  (PRILOSEC) 40 MG capsule Take 1 capsule (40 mg total) by mouth daily. 05/12/23  Yes Pyrtle, Gordy HERO, MD  potassium chloride  (KLOR-CON  M) 10 MEQ tablet Take 1 tablet (10 mEq total) by mouth daily. 08/25/22  Yes Linzey Ramser, Emil Schanz, MD  Prucalopride Succinate  (MOTEGRITY ) 2 MG TABS Take 1 tablet (2 mg total) by mouth daily. 05/12/23  Yes Pyrtle, Gordy HERO, MD  sildenafil  (VIAGRA ) 100 MG tablet Take 0.5-1 tablets (50-100 mg total) by mouth daily as needed for erectile dysfunction. 09/29/23  Yes Tony Granquist, Emil Schanz, MD  tacrolimus  (PROTOPIC ) 0.1 % ointment Apply topically 2 (two) times daily. 03/30/23  Yes Alm Delon SAILOR, DO  Travoprost (TRAVATAN OP) Apply to eye at bedtime.   Yes [provider]  Travoprost, BAK Free, (TRAVATAN) 0.004 % SOLN ophthalmic solution  09/28/20  Yes [provider]  gabapentin  (NEURONTIN ) 300 MG capsule Take 1 tablet x 1 week, then increase 2 tablet at bedtime. Patient not taking: Reported on 12/02/2023 11/11/22   Patel, Donika K, DO    No Known Allergies  Patient Active Problem  List   Diagnosis Date Noted   Injury of finger of right hand 09/29/2023   Mallet finger of right hand 09/29/2023   Neuropathy 05/09/2022   Hemorrhoids 05/09/2022   Prediabetes 03/19/2022   Murmur 01/30/2022   History of fusion of lumbar spine 09/24/2021   Spinal stenosis of lumbar region 09/24/2021   Depression 08/16/2015   Erectile dysfunction 04/25/2015   Disorder of liver 10/13/2011   HYPERCHOLESTEROLEMIA 03/11/2010    Unspecified glaucoma 09/03/2009   THYROID  NODULE 08/28/2008   BIPOLAR DISORDER UNSPECIFIED 12/13/2007    Past Medical History:  Diagnosis Date   Anxiety    ARM PAIN, LEFT 11/20/2009   Arthritis    BIPOLAR DISORDER UNSPECIFIED 12/13/2007   patient denies   Blood transfusion without reported diagnosis    CHEST PAIN 03/23/2009   currently having    DEPRESSION 12/13/2007   FATIGUE 03/23/2009   GERD (gastroesophageal reflux disease)    GLAUCOMA 09/03/2009   Headache    HEMORRHOIDS 09/03/2009   HOARSENESS 09/03/2009   HYPERCHOLESTEROLEMIA 03/11/2010   MYALGIA 08/13/2009   NECK PAIN 01/18/2009   Palpitations 03/23/2009   Polydipsia 01/24/2008   Shortness of breath 03/23/2009   THYROID  NODULE 08/28/2008   benign on Bx   THYROIDITIS 12/13/2007   Tubular adenoma of colon    Tubular adenoma of colon     Past Surgical History:  Procedure Laterality Date   BACK SURGERY     x 3, lumbar   EYE SURGERY     2008   FINGER SURGERY     TRANSANAL HEMORRHOIDAL DEARTERIALIZATION N/A 03/07/2016   Procedure: TRANSANAL HEMORRHOIDAL DEARTERIALIZATION HEMORRHOIDAL LIGATION/PEXY EXAM UNDER ANESTHESIA WITH  POSSIBLE HEMORRHOIDECTOMY ;  Surgeon: Elspeth Schultze, MD;  Location: [facility];  Service: General;  Laterality: N/A;    Social History   Socioeconomic History   Marital status: Divorced    Spouse name: Not on file   Number of children: 1   Years of education: Not on file   Highest education level: Not on file  Occupational History   Occupation: Merchandiser, Retail at [employer]  Tobacco Use   Smoking status: Never   Smokeless tobacco: Never  Vaping Use   Vaping status: Never Used  Substance and Sexual Activity   Alcohol use: No    Alcohol/week: 0.0 standard drinks of alcohol   Drug use: No   Sexual activity: Not on file  Other Topics Concern   Not on file  Social History Narrative   Mr Roker lives with his wife in [place], works as a human resources officer in a scientist, 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        He is originally form [place]         Are you right handed or left handed? Right Handed    Are you currently employed ? 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 Yes   Who lives with you? No one    What type of home do you live in: 1 story or 2 story? Lives in a one story home       Social Drivers of Health   Financial Resource Strain: Patient Declined (12/01/2023)   Overall Financial Resource Strain (CARDIA)    Difficulty of Paying Living Expenses: Patient declined  Recent Concern: Financial Resource Strain - Medium Risk (09/29/2023)   Overall Financial Resource Strain (CARDIA)    Difficulty of Paying Living Expenses: Somewhat hard  Food Insecurity: Patient Declined (  12/01/2023)   Hunger Vital Sign    Worried About Running Out of Food in the Last Year: Patient declined    Ran Out of Food in the Last Year: Patient declined  Transportation Needs: No Transportation Needs (12/01/2023)   PRAPARE - Administrator, Civil Service (Medical): No    Lack of Transportation (Non-Medical): No  Physical Activity: Unknown (12/01/2023)   Exercise Vital Sign    Days of Exercise per Week: Patient declined    Minutes of Exercise per Session: Not on file  Stress: No Stress Concern Present (12/01/2023)   [employer] of Occupational Health - Occupational Stress Questionnaire    Feeling of Stress : Not at all  Social Connections: Moderately Isolated (12/01/2023)   Social Connection and Isolation Panel [NHANES]    Frequency of Communication with Friends and Family: More than three times a week    Frequency of Social Gatherings with Friends and Family: Once a week    Attends Religious Services: 1 to 4 times per year    Active Member of [employer] or Organizations: No    Attends Engineer, Structural: Not on file    Marital Status: Divorced  Catering Manager Violence: Not on file    Family History  Problem Relation Age of Onset    Dementia Mother    Cancer Father 11       colon   Colon cancer Father    Colon cancer Sister    Hypertension Other    Esophageal cancer Neg Hx    Rectal cancer Neg Hx    Stomach cancer Neg Hx      Review of Systems  Constitutional:  Positive for fever.  HENT:  Positive for congestion. Negative for sore throat.   Respiratory:  Positive for cough and sputum production.   Cardiovascular:  Negative for chest pain and palpitations.  Gastrointestinal:  Negative for abdominal pain, diarrhea, nausea and vomiting.  Genitourinary: Negative.  Negative for dysuria and hematuria.  Skin:  Negative for rash.  Neurological:  Negative for dizziness and headaches.    Vitals:   12/02/23 1546  BP: 122/78  Pulse: 69  Temp: 98.1 F (36.7 C)  SpO2: 96%    Physical Exam Vitals reviewed.  Constitutional:      Appearance: Normal appearance.  HENT:     Head: Normocephalic.     Mouth/Throat:     Mouth: Mucous membranes are moist.     Pharynx: Oropharynx is clear.  Eyes:     Extraocular Movements: Extraocular movements intact.     Conjunctiva/sclera: Conjunctivae normal.     Pupils: Pupils are equal, round, and reactive to light.  Cardiovascular:     Rate and Rhythm: Normal rate and regular rhythm.     Pulses: Normal pulses.     Heart sounds: Normal heart sounds.  Pulmonary:     Effort: Pulmonary effort is normal.     Breath sounds: Normal breath sounds.  Musculoskeletal:     Cervical back: No tenderness.  Lymphadenopathy:     Cervical: No cervical adenopathy.  Skin:    General: Skin is warm and dry.     Capillary Refill: Capillary refill takes less than 2 seconds.  Neurological:     General: No focal deficit present.     Mental Status: He is alert and oriented to person, place, and time.  Psychiatric:        Mood and Affect: Mood normal.        Behavior: Behavior  normal.    Results for orders placed or performed in visit on 12/02/23 (from the past 24 hours)  POC COVID-19      Status: None   Collection Time: 12/02/23  4:20 PM  Result Value Ref Range   SARS Coronavirus 2 Ag Negative Negative  POCT Influenza A/B     Status: None   Collection Time: 12/02/23  4:20 PM  Result Value Ref Range   Influenza A, POC Negative Negative   Influenza B, POC Negative Negative     ASSESSMENT & PLAN: A total of 32 minutes was spent with the patient and counseling/coordination of care regarding preparing for this visit, review of most recent office visit notes, review of all medications, diagnosis of lower respiratory infection and need for antibiotics, symptom management, review of most recent bloodwork results, review of health maintenance items, education on nutrition, prognosis, documentation, and need for follow up.   Problem List Items Addressed This Visit       Respiratory   Lower respiratory infection - Primary   Clinically stable.  No signs of pneumonia. Negative COVID and flu tests Recommend daily azithromycin  for 5 days Symptom management discussed Advised to rest and stay well-hydrated Recommend over-the-counter Mucinex dm with cough drops Advised to contact the office if no better or worse during the next several days      Relevant Medications   azithromycin  (ZITHROMAX ) 250 MG tablet     Other   Acute cough   Cough management discussed Advised to take over-the-counter Mucinex DM and cough drops Start Tessalon  200 mg 3 times a day Advised to rest and stay well-hydrated      Relevant Medications   benzonatate  (TESSALON ) 200 MG capsule   Other Relevant Orders   POC COVID-19 (Completed)   POCT Influenza A/B (Completed)   Patient Instructions  Cough, Adult A cough helps to clear your throat and lungs. It may be a sign of an illness or another condition. A short-term (acute) cough may last 2-3 weeks. A long-term (chronic) cough may last 8 or more weeks. Many things can cause a cough. They include: Illnesses such as: An infection in your throat or  lungs. Asthma or other heart or lung problems. Gastroesophageal reflux. This is when acid comes back up from your stomach. Breathing in things that bother (irritate) your lungs. Allergies. Postnasal drip. This is when mucus runs down the back of your throat. Smoking. Some medicines. Follow these instructions at home: Medicines Take over-the-counter and prescription medicines only as told by your doctor. Talk with your doctor before you take cough medicine (cough suppressants). Eating and drinking Do not drink alcohol. Do not drink caffeine. Drink enough fluid to keep your pee (urine) pale yellow. Lifestyle Stay away from cigarette smoke. Do not smoke or use any products that contain nicotine or tobacco. If you need help quitting, ask your doctor. Stay away from things that make you cough. These may include perfume, candles, cleaning products, or campfire smoke. General instructions  Watch for any changes to your cough. Tell your doctor about them. Always cover your mouth when you cough. If the air is dry in your home, use a cool mist vaporizer or humidifier. If your cough is worse at night, try using extra pillows to raise your head up higher while you sleep. Rest as needed. Contact a doctor if: You have new symptoms. Your symptoms get worse. You cough up pus. You have a fever that does not go away. Your cough does not  get better after 2-3 weeks. Cough medicine does not help, and you are not sleeping well. You have pain that gets worse or is not helped with medicine. You are losing weight and do not know why. You have night sweats. Get help right away if: You cough up blood. You have trouble breathing. Your heart is beating very fast. These symptoms may be an emergency. Get help right away. Call 911. Do not wait to see if the symptoms will go away. Do not drive yourself to the hospital. This information is not intended to replace advice given to you by your health care  provider. Make sure you discuss any questions you have with your health care provider. Document Revised: 06/13/2022 Document Reviewed: 06/13/2022 Elsevier Patient Education  2024 Elsevier Inc.      Emil Schaumann, MD [facility]

## 2023-12-02 NOTE — Assessment & Plan Note (Signed)
Cough management discussed Advised to take over-the-counter Mucinex DM and cough drops Start Tessalon 200 mg 3 times a day Advised to rest and stay well-hydrated

## 2023-12-07 ENCOUNTER — Ambulatory Visit: Payer: Managed Care, Other (non HMO) | Admitting: Orthopedic Surgery

## 2023-12-07 ENCOUNTER — Other Ambulatory Visit (INDEPENDENT_AMBULATORY_CARE_PROVIDER_SITE_OTHER): Payer: Self-pay

## 2023-12-07 DIAGNOSIS — M20011 Mallet finger of right finger(s): Secondary | ICD-10-CM | POA: Diagnosis not present

## 2023-12-07 NOTE — Progress Notes (Signed)
 [name] - 62 y.o. male MRN [id number]  Date of birth: 02/19/1961  Office Visit Note: Visit Date: 12/07/2023 PCP: Elvira Hammersmith, MD Referred by: Elvira Hammersmith, *  Subjective: Chief Complaint  Patient presents with   Right Index Finger - Follow-up   HPI: [name] is a pleasant 63 y.o. male who presents today for follow up of a right index finger bony mallet injury, injury approximately 12 weeks prior.  He has been compliant with the mallet splint as instructed.  Pertinent ROS were reviewed with the patient and found to be negative unless otherwise specified above in HPI.     Assessment & Plan: Visit Diagnoses:  1. 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  Repeat x-rays were performed today which do show stable appearance of the DIP joint without evidence of volar subluxation or significant bony displacement.  He is demonstrating appropriate healing to discontinue splint at this time.  He can still continue to use as needed particularly at work given that he works Librarian, academic and could have impact injury to the distal aspect of the digit which can cause pain.  I did explain that he can remove the splint as needed for all her other activities and can eventually discontinue it once becomes more comfortable.  He is welcome to return to me as needed in the future, he expressed full understanding.    Follow-up: No follow-ups on file.   Meds & Orders: No orders of the defined types were placed in this encounter.   Orders Placed This Encounter  Procedures   XR Finger Index Right     Procedures: No procedures performed      Clinical History: No specialty comments available.  He reports that he has never smoked. He has never used smokeless tobacco.  Recent Labs    08/31/23 1042  HGBA1C 6.2    Objective:   Vital Signs: There  were no vitals taken for this visit.  Physical Exam  Gen: Well-appearing, in no acute distress; non-toxic CV: Regular Rate. Well-perfused. Warm.  Resp: Breathing unlabored on room air; no wheezing. Psych: Fluid speech in conversation; appropriate affect; normal thought process  Ortho Exam Right index finger: - Minimal tenderness over the DIP region, skin is intact - PIP range of motion 0-80 - Digit remains warm well-perfused, sensation is intact distally with appropriate capillary refill  Imaging: X-rays of the index finger, multiple views were obtained X-rays demonstrate bony mallet injury of the distal phalanx, without significant displacement.  There is no evidence of volar subluxation of the distal phalanx, DIP remains well located in all planes.  Past Medical/Family/Surgical/Social History: Medications & Allergies reviewed per EMR, new medications updated. Patient Active Problem List   Diagnosis Date Noted   Lower respiratory infection 12/02/2023   Injury of finger of right hand 09/29/2023   Mallet finger of right hand 09/29/2023   Neuropathy 05/09/2022   Hemorrhoids 05/09/2022   Prediabetes 03/19/2022   Murmur 01/30/2022   History of fusion of lumbar spine 09/24/2021   Spinal stenosis of lumbar region 09/24/2021   Acute cough 06/15/2017   Depression 08/16/2015   Erectile dysfunction 04/25/2015   Disorder of liver 10/13/2011   HYPERCHOLESTEROLEMIA 03/11/2010   Unspecified glaucoma 09/03/2009   THYROID  NODULE 08/28/2008   BIPOLAR DISORDER UNSPECIFIED 12/13/2007   Past Medical History:  Diagnosis Date   Anxiety  ARM PAIN, LEFT 11/20/2009   Arthritis    BIPOLAR DISORDER UNSPECIFIED 12/13/2007   patient denies   Blood transfusion without reported diagnosis    CHEST PAIN 03/23/2009   currently having    DEPRESSION 12/13/2007   FATIGUE 03/23/2009   GERD (gastroesophageal reflux disease)    GLAUCOMA 09/03/2009   Headache    HEMORRHOIDS 09/03/2009   HOARSENESS  09/03/2009   HYPERCHOLESTEROLEMIA 03/11/2010   MYALGIA 08/13/2009   NECK PAIN 01/18/2009   Palpitations 03/23/2009   Polydipsia 01/24/2008   Shortness of breath 03/23/2009   THYROID  NODULE 08/28/2008   benign on Bx   THYROIDITIS 12/13/2007   Tubular adenoma of colon    Tubular adenoma of colon    Family History  Problem Relation Age of Onset   Dementia Mother    Cancer Father 56       colon   Colon cancer Father    Colon cancer Sister    Hypertension Other    Esophageal cancer Neg Hx    Rectal cancer Neg Hx    Stomach cancer Neg Hx    Past Surgical History:  Procedure Laterality Date   BACK SURGERY     x 3, lumbar   EYE SURGERY     2008   FINGER SURGERY     TRANSANAL HEMORRHOIDAL DEARTERIALIZATION N/A 03/07/2016   Procedure: TRANSANAL HEMORRHOIDAL DEARTERIALIZATION HEMORRHOIDAL LIGATION/PEXY EXAM UNDER ANESTHESIA WITH  POSSIBLE HEMORRHOIDECTOMY ;  Surgeon: Candyce Champagne, MD;  Location: [facility];  Service: General;  Laterality: N/A;   Social History   Occupational History   Occupation: Merchandiser, retail at [employer]  Tobacco Use   Smoking status: Never   Smokeless tobacco: Never  Vaping Use   Vaping status: Never Used  Substance and Sexual Activity   Alcohol use: No    Alcohol/week: 0.0 standard drinks of alcohol   Drug use: No   Sexual activity: Not on file    Jakita Dutkiewicz Alvia Jointer, M.D. [facility] 3:01 PM

## 2024-01-03 ENCOUNTER — Other Ambulatory Visit: Payer: Self-pay | Admitting: Internal Medicine

## 2024-01-03 DIAGNOSIS — K219 Gastro-esophageal reflux disease without esophagitis: Secondary | ICD-10-CM

## 2024-03-10 ENCOUNTER — Other Ambulatory Visit: Payer: Self-pay | Admitting: Emergency Medicine

## 2024-03-10 DIAGNOSIS — E876 Hypokalemia: Secondary | ICD-10-CM

## 2024-03-28 ENCOUNTER — Telehealth: Payer: Self-pay | Admitting: Internal Medicine

## 2024-03-28 MED ORDER — PRUCALOPRIDE SUCCINATE 2 MG PO TABS: 1.0000 | ORAL_TABLET | Freq: Every day | ORAL | 0 refills | Status: AC

## 2024-03-28 NOTE — Telephone Encounter (Signed)
 Inbound call from patient stating after 6/3 his motergrity will return back to full price and his discount card will no longer be active. Patient is requesting a call back to discuss further. Please advise, thank you.

## 2024-03-28 NOTE — Telephone Encounter (Signed)
 Patient states after 6/30 his insurance will no longer cover Motegrity  at it's discounted price and wishes to get a 90 day supply to last him until his appt. Prescription sent to patient's pharmacy. Patient also expresses he wishes to see Dr. Bridgett Camps instead of a APP. Patient rescheduled to see Dr. Bridgett Camps on 06/15/24 at 2:10pm.

## 2024-03-29 ENCOUNTER — Ambulatory Visit: Payer: Managed Care, Other (non HMO) | Admitting: Dermatology

## 2024-03-29 ENCOUNTER — Encounter: Payer: Self-pay | Admitting: Dermatology

## 2024-03-29 VITALS — BP 100/68 | HR 77

## 2024-03-29 DIAGNOSIS — B356 Tinea cruris: Secondary | ICD-10-CM

## 2024-03-29 DIAGNOSIS — B351 Tinea unguium: Secondary | ICD-10-CM | POA: Diagnosis not present

## 2024-03-29 DIAGNOSIS — L249 Irritant contact dermatitis, unspecified cause: Secondary | ICD-10-CM

## 2024-03-29 MED ORDER — NYSTATIN-TRIAMCINOLONE 100000-0.1 UNIT/GM-% EX OINT: TOPICAL_OINTMENT | Freq: Two times a day (BID) | CUTANEOUS | 3 refills | Status: AC

## 2024-03-29 MED ORDER — TACROLIMUS 0.1 % EX OINT: TOPICAL_OINTMENT | Freq: Two times a day (BID) | CUTANEOUS | 2 refills | Status: AC

## 2024-03-29 MED ORDER — TERBINAFINE HCL 250 MG PO TABS: 250.0000 mg | ORAL_TABLET | Freq: Every day | ORAL | 0 refills | Status: AC

## 2024-03-29 NOTE — Progress Notes (Unsigned)
   Follow-Up Visit   Subjective  [name] is a 63 y.o. male who presents for the following: Onychomycosis And Pruritus  Patient present today for follow up visit for Onychomycosis. Patient was last evaluated on 09/28/23. At this visit patient was prescribed Jublia . Patient reports toenails are unchanged. Patient denies medication changes. He states the itching in the groin resolved but returned about 1 week ago, He is currently unsure of the cream he is applying.   The following portions of the chart were reviewed this encounter and updated as appropriate: medications, allergies, medical history  Review of Systems:  No other skin or systemic complaints except as noted in HPI or Assessment and Plan.  Objective  Well appearing patient in no apparent distress; mood and affect are within normal limits.  A focused examination was performed of the following areas: Toenails and B/l Groin  Relevant exam findings are noted in the Assessment and Plan.              Assessment & Plan   ONYCHOMYCOSIS Exam: Thickened toenails with subungal debris c/w onychomycosis.  Improved but not yet clear  Chronic and persistent condition with duration or expected duration over one year. Condition is symptomatic/ bothersome to patient. Not currently at goal.  Treatment Plan: - Recommended Taking daily collagen supplement to help nails to grow quicker - Advised to continue Jublia  daily  Tinea Curius in B/L Groin  Treatment Plan: - Prescribed Terbinafine  - Prescribed Nystatin -TMC to apply 2 times daily until areas are clear  IRRITANT CONTACT DERMATITIS Exam: Scaly pink papules and/or plaques at B/L Buttock  Treatment Plan: - Refilled Tacrolimus  to apply to buttock until area clears       Return in about 6 months (around 09/28/2024) for Oncymycosis, Tinea F/U.  I, Jetta Ager, am acting as Neurosurgeon for [employer], DO.  Documentation: I have reviewed the above documentation for  accuracy and completeness, and I agree with the above.  Louana Roup, DO

## 2024-03-29 NOTE — Patient Instructions (Addendum)
 Date: Tue Mar 29 2024  Hello Mena, Brentin,  Thank you for visiting today. Here is a summary of the key instructions:  - Medications:   - Continue using Jublia  for toenail fungus until nails fully regrow   - Take terbinafine 250 mg, one tablet daily for 2 weeks   - Apply nystatin -triamcinolone  cream twice a day for up to 2 weeks     - Stop if itching resolves sooner   - Apply tacrolimus  cream daily as needed for itching and irritation on buttocks  - Supplements:   - Add collagen powder to morning coffee to help with nail growth  - Lifestyle Changes:   - Eat more foods with vital proteins to support nail growth  - Follow-up:   - Return for follow-up appointment in 6 months  We look forward to seeing you at your next visit. If you have any questions or concerns before then, please do not hesitate to contact our office.  Warm regards,  Dr. Louana Roup [facility]           Important Information   Due to recent changes in healthcare laws, you may see results of your pathology and/or laboratory studies on MyChart before the doctors have had a chance to review them. We understand that in some cases there may be results that are confusing or concerning to you. Please understand that not all results are received at the same time and often the doctors may need to interpret multiple results in order to provide you with the best plan of care or course of treatment. Therefore, we ask that you please give us  2 business days to thoroughly review all your results before contacting the office for clarification. Should we see a critical lab result, you will be contacted sooner.     If You Need Anything After Your Visit   If you have any questions or concerns for your doctor, please call our main line at [phone]. If no one answers, please leave a voicemail as directed and we will return your call as soon as possible. Messages left after 4 pm will be answered the following business  day.    You may also send us  a message via MyChart. We typically respond to MyChart messages within 1-2 business days.  For prescription refills, please ask your pharmacy to contact our office. Our fax number is [phone].  If you have an urgent issue when the clinic is closed that cannot wait until the next business day, you can page your doctor at the number below.     Please note that while we do our best to be available for urgent issues outside of office hours, we are not available 24/7.    If you have an urgent issue and are unable to reach us , you may choose to seek medical care at your doctor's office, retail clinic, urgent care center, or emergency room.   If you have a medical emergency, please immediately call 911 or go to the emergency department. In the event of inclement weather, please call our main line at [phone] for an update on the status of any delays or closures.  Dermatology Medication Tips: Please keep the boxes that topical medications come in in order to help keep track of the instructions about where and how to use these. Pharmacies typically print the medication instructions only on the boxes and not directly on the medication tubes.   If your medication is too expensive, please contact our office at [phone]  or send us  a message through MyChart.    We are unable to tell what your co-pay for medications will be in advance as this is different depending on your insurance coverage. However, we may be able to find a substitute medication at lower cost or fill out paperwork to get insurance to cover a needed medication.    If a prior authorization is required to get your medication covered by your insurance company, please allow us  1-2 business days to complete this process.   Drug prices often vary depending on where the prescription is filled and some pharmacies may offer cheaper prices.   The website www.goodrx.com contains coupons for medications  through different pharmacies. The prices here do not account for what the cost may be with help from insurance (it may be cheaper with your insurance), but the website can give you the price if you did not use any insurance.  - You can print the associated coupon and take it with your prescription to the pharmacy.  - You may also stop by our office during regular business hours and pick up a GoodRx coupon card.  - If you need your prescription sent electronically to a different pharmacy, notify our office through [facility] or by phone at [phone]

## 2024-03-31 ENCOUNTER — Other Ambulatory Visit: Payer: Self-pay | Admitting: Internal Medicine

## 2024-03-31 DIAGNOSIS — K219 Gastro-esophageal reflux disease without esophagitis: Secondary | ICD-10-CM

## 2024-04-25 ENCOUNTER — Ambulatory Visit (HOSPITAL_COMMUNITY)

## 2024-06-06 ENCOUNTER — Ambulatory Visit: Admitting: Physician Assistant

## 2024-06-15 ENCOUNTER — Ambulatory Visit: Admitting: Internal Medicine

## 2024-06-15 ENCOUNTER — Encounter: Payer: Self-pay | Admitting: Internal Medicine

## 2024-06-15 VITALS — BP 116/70 | HR 67 | Ht 65.0 in | Wt 169.2 lb

## 2024-06-15 DIAGNOSIS — K581 Irritable bowel syndrome with constipation: Secondary | ICD-10-CM | POA: Diagnosis not present

## 2024-06-15 DIAGNOSIS — Z860100 Personal history of colon polyps, unspecified: Secondary | ICD-10-CM

## 2024-06-15 DIAGNOSIS — K219 Gastro-esophageal reflux disease without esophagitis: Secondary | ICD-10-CM | POA: Diagnosis not present

## 2024-06-15 DIAGNOSIS — R159 Full incontinence of feces: Secondary | ICD-10-CM

## 2024-06-15 DIAGNOSIS — R140 Abdominal distension (gaseous): Secondary | ICD-10-CM

## 2024-06-15 MED ORDER — IBSRELA 50 MG PO TABS: 1.0000 | ORAL_TABLET | Freq: Two times a day (BID) | ORAL | 11 refills | Status: AC

## 2024-06-15 NOTE — Patient Instructions (Signed)
 Discontinue Motegrity .   Start the samples of Ibsrella one tablet by mouth twice daily.  We have sent your prescription of [facility] to [facility] which is a mail order pharmacy that will get the prescription the lowest co-pay possible and mail it to you.  Remain on omeprazole  daily _______________________________________________________  If your blood pressure at your visit was 140/90 or greater, please contact your primary care physician to follow up on this.  _______________________________________________________  If you are age 21 or older, your body mass index should be between 23-30. Your Body mass index is 28.16 kg/m. If this is out of the aforementioned range listed, please consider follow up with your Primary Care Provider.  If you are age 47 or younger, your body mass index should be between 19-25. Your Body mass index is 28.16 kg/m. If this is out of the aformentioned range listed, please consider follow up with your Primary Care Provider.   ________________________________________________________  The [facility] providers would like to encourage you to use MYCHART to communicate with providers for non-urgent requests or questions.  Due to long hold times on the telephone, sending your provider a message by [facility] may be a faster and more efficient way to get a response.  Please allow 48 business hours for a response.  Please remember that this is for non-urgent requests.  _______________________________________________________  [facility] is using a team-based approach to care.  Your team is made up of your doctor and two to three APPS. Our APPS (Nurse Practitioners and Physician Assistants) work with your physician to ensure care continuity for you. They are fully qualified to address your health concerns and develop a treatment plan. They communicate directly with your gastroenterologist to care for you. Seeing the Advanced Practice Practitioners on your  physician's team can help you by facilitating care more promptly, often allowing for earlier appointments, access to diagnostic testing, procedures, and other specialty referrals.

## 2024-06-15 NOTE — Progress Notes (Signed)
   Subjective:    Patient ID: [name], male    DOB: 05/19/1961, 63 y.o.   MRN: [id number]  HPI [name] is a 63 year old male who presents with constipation and bloating, history of adenomatous and sessile related polyps, GERD who is here for follow-up.  He experiences stomach bloating and constipation, describing the bloating as causing his stomach to feel 'bigger' and notes incomplete bowel evacuation. He has bowel movements twice a day, but with minimal output. He also reports a 'small leak' of stool when urinating, which he associates with incomplete bowel evacuation.  He has a history of colonoscopy performed on August 10, 2023, during which two 4mm polyps were removed, identified as one tubular adenoma and one hyperplastic polyp. Hemorrhoidectomy scarring was noted in the distal rectum.  He has been treated with antibiotics for bloating in the past, which provided temporary relief. He has tried various medications for constipation, including Trulance , Motegrity , and Linzess , but reports that Motegrity  is not effectively relieving his symptoms. He currently has two bottles of Motegrity  left and continues to take it despite ongoing symptoms.  He is currently taking omeprazole  40 mg daily for reflux and heartburn, which he states is effective.   Review of Systems As per HPI, otherwise negative  Current Medications, Allergies, Past Medical History, Past Surgical History, Family History and Social History were reviewed in [employer] record.     Objective:   Physical Exam BP 116/70   Pulse 67   Ht 5' 5 (1.651 m)   Wt 169 lb 4 oz (76.8 kg)   SpO2 96%   BMI 28.16 kg/m  Gen: awake, alert, NAD HEENT: anicteric  CV: RRR, no mrg Pulm: CTA b/l Abd: soft, NT, mildly distended without ascites, +BS throughout Ext: no c/c/e Neuro: nonfocal   DIAGNOSTIC Colonoscopy: Two 4 mm polyps removed, hemorrhoidectomy scarring in distal rectum  (08/10/2023)  PATHOLOGY Polyp Pathology: Tubular adenoma and hyperplastic polyp (08/10/2023)     Assessment & Plan:   Chronic constipation IBS; associated bloating and incomplete evacuation Symptoms include bloating and incomplete evacuation despite twice-daily bowel movements. Motegrity  was incompletely effective. Ibsrela  considered for its different mechanism to improve constipation and potentially alleviate bloating. Antibiotics may be reinitiated if bloating persists after constipation improves. - Discontinue Motegrity . - Initiate Ibsrela  50 mg twice daily for three weeks as a trial. - Provide Ibsrela  samples for three weeks. - Instruct to monitor symptoms and report via MyChart or call if ineffective. - Consider reinitiating antibiotics for bloating if constipation improves but bloating persists.  Last took metronidazole  250 mg 3 times daily x 7 days with some improvement  Gastroesophageal reflux disease (GERD) GERD well-managed with omeprazole  40 mg daily. No symptom changes reported. - Continue omeprazole  40 mg daily.  Fecal incontinence Presents as small leakage during urination, possibly related to incomplete evacuation from ineffective management of constipation - Reassess after trial of Ibsrela   History of colonic polyps -Surveillance colonoscopy recommended around October 2029  30 minutes total spent today including patient facing time, coordination of care, reviewing medical history/procedures/pertinent radiology studies, and documentation of the encounter.

## 2024-06-16 ENCOUNTER — Ambulatory Visit (INDEPENDENT_AMBULATORY_CARE_PROVIDER_SITE_OTHER): Admitting: Family

## 2024-06-16 ENCOUNTER — Encounter: Payer: Self-pay | Admitting: Family

## 2024-06-16 ENCOUNTER — Encounter: Payer: Self-pay | Admitting: Emergency Medicine

## 2024-06-16 VITALS — BP 114/88 | HR 77 | Temp 97.9°F | Ht 65.0 in | Wt 169.0 lb

## 2024-06-16 DIAGNOSIS — Z0000 Encounter for general adult medical examination without abnormal findings: Secondary | ICD-10-CM

## 2024-06-16 DIAGNOSIS — R7303 Prediabetes: Secondary | ICD-10-CM

## 2024-06-16 DIAGNOSIS — E041 Nontoxic single thyroid nodule: Secondary | ICD-10-CM

## 2024-06-16 DIAGNOSIS — F319 Bipolar disorder, unspecified: Secondary | ICD-10-CM | POA: Diagnosis not present

## 2024-06-16 DIAGNOSIS — E876 Hypokalemia: Secondary | ICD-10-CM

## 2024-06-16 DIAGNOSIS — R051 Acute cough: Secondary | ICD-10-CM | POA: Diagnosis not present

## 2024-06-16 DIAGNOSIS — Z0001 Encounter for general adult medical examination with abnormal findings: Secondary | ICD-10-CM | POA: Diagnosis not present

## 2024-06-16 DIAGNOSIS — K219 Gastro-esophageal reflux disease without esophagitis: Secondary | ICD-10-CM

## 2024-06-16 DIAGNOSIS — E7800 Pure hypercholesterolemia, unspecified: Secondary | ICD-10-CM

## 2024-06-16 DIAGNOSIS — Z125 Encounter for screening for malignant neoplasm of prostate: Secondary | ICD-10-CM

## 2024-06-16 DIAGNOSIS — G629 Polyneuropathy, unspecified: Secondary | ICD-10-CM

## 2024-06-16 LAB — CBC WITH DIFFERENTIAL/PLATELET
Basophils Absolute: 0 K/uL (ref 0.0–0.1)
Basophils Relative: 0.5 % (ref 0.0–3.0)
Eosinophils Absolute: 0 K/uL (ref 0.0–0.7)
Eosinophils Relative: 1 % (ref 0.0–5.0)
HCT: 42.1 % (ref 39.0–52.0)
Hemoglobin: 13.8 g/dL (ref 13.0–17.0)
Lymphocytes Relative: 61 % — ABNORMAL HIGH (ref 12.0–46.0)
Lymphs Abs: 2.9 K/uL (ref 0.7–4.0)
MCHC: 32.9 g/dL (ref 30.0–36.0)
MCV: 86 fl (ref 78.0–100.0)
Monocytes Absolute: 0.3 K/uL (ref 0.1–1.0)
Monocytes Relative: 6.7 % (ref 3.0–12.0)
Neutro Abs: 1.5 K/uL (ref 1.4–7.7)
Neutrophils Relative %: 30.8 % — ABNORMAL LOW (ref 43.0–77.0)
Platelets: 256 K/uL (ref 150.0–400.0)
RBC: 4.89 Mil/uL (ref 4.22–5.81)
RDW: 13.9 % (ref 11.5–15.5)
WBC: 4.7 K/uL (ref 4.0–10.5)

## 2024-06-16 LAB — LIPID PANEL
Cholesterol: 186 mg/dL (ref 0–200)
HDL: 77.4 mg/dL (ref >39.00)
LDL Cholesterol: 78 mg/dL (ref 0–99)
NonHDL: 108.86
Total CHOL/HDL Ratio: 2
Triglycerides: 152 mg/dL — ABNORMAL HIGH (ref 0.0–149.0)
VLDL: 30.4 mg/dL (ref 0.0–40.0)

## 2024-06-16 LAB — COMPREHENSIVE METABOLIC PANEL WITH GFR
ALT: 37 U/L (ref 0–53)
AST: 31 U/L (ref 0–37)
Albumin: 4.5 g/dL (ref 3.5–5.2)
Alkaline Phosphatase: 109 U/L (ref 39–117)
BUN: 8 mg/dL (ref 6–23)
CO2: 30 meq/L (ref 19–32)
Calcium: 9.3 mg/dL (ref 8.4–10.5)
Chloride: 101 meq/L (ref 96–112)
Creatinine, Ser: 0.9 mg/dL (ref 0.40–1.50)
GFR: 92.54 mL/min (ref >60.00)
Glucose, Bld: 90 mg/dL (ref 70–99)
Potassium: 4.6 meq/L (ref 3.5–5.1)
Sodium: 139 meq/L (ref 135–145)
Total Bilirubin: 0.6 mg/dL (ref 0.2–1.2)
Total Protein: 7.2 g/dL (ref 6.0–8.3)

## 2024-06-16 LAB — TSH: TSH: 1.75 u[IU]/mL (ref 0.35–5.50)

## 2024-06-16 LAB — PSA: PSA: 0.44 ng/mL (ref 0.10–4.00)

## 2024-06-16 LAB — HEMOGLOBIN A1C: Hgb A1c MFr Bld: 6.7 % — ABNORMAL HIGH (ref 4.6–6.5)

## 2024-06-16 MED ORDER — IBUPROFEN 600 MG PO TABS: 600.0000 mg | ORAL_TABLET | Freq: Three times a day (TID) | ORAL | 0 refills | Status: DC | PRN

## 2024-06-16 MED ORDER — FLUTICASONE PROPIONATE 50 MCG/ACT NA SUSP: 1.0000 | Freq: Two times a day (BID) | NASAL | 6 refills | Status: AC

## 2024-06-16 MED ORDER — OMEPRAZOLE 40 MG PO CPDR
40.0000 mg | DELAYED_RELEASE_CAPSULE | Freq: Every day | ORAL | 0 refills | Status: DC
Start: 2024-06-16 — End: 2024-06-24

## 2024-06-16 MED ORDER — BENZONATATE 200 MG PO CAPS: 200.0000 mg | ORAL_CAPSULE | Freq: Two times a day (BID) | ORAL | 0 refills | Status: AC | PRN

## 2024-06-16 MED ORDER — SILDENAFIL CITRATE 100 MG PO TABS: 50.0000 mg | ORAL_TABLET | Freq: Every day | ORAL | 3 refills | Status: AC | PRN

## 2024-06-16 MED ORDER — POTASSIUM CHLORIDE CRYS ER 10 MEQ PO TBCR: 10.0000 meq | EXTENDED_RELEASE_TABLET | Freq: Every day | ORAL | 2 refills | Status: AC

## 2024-06-16 MED ORDER — ATORVASTATIN CALCIUM 20 MG PO TABS: 20.0000 mg | ORAL_TABLET | Freq: Every day | ORAL | 2 refills | Status: AC

## 2024-06-17 ENCOUNTER — Ambulatory Visit: Payer: Self-pay | Admitting: Family

## 2024-06-19 ENCOUNTER — Encounter: Payer: Self-pay | Admitting: Family

## 2024-06-19 NOTE — Progress Notes (Signed)
 Complete physical exam  Patient: [name]   DOB: 1960/12/02   63 y.o. Male  MRN: [id number]  Subjective:    No chief complaint on file.   [name] is a 63 y.o. male who presents today for a complete physical exam. He reports consuming a general diet. The patient does not participate in regular exercise at present. He generally feels well. He reports sleeping well. He does not have additional problems to discuss today.    Most recent fall risk assessment:    06/16/2024    1:10 PM  Fall Risk   Falls in the past year? 0  Number falls in past yr: 0  Injury with Fall? 0  Risk for fall due to : No Fall Risks  Follow up Falls evaluation completed     Most recent depression screenings:    06/16/2024    1:10 PM 12/02/2023    3:52 PM  PHQ 2/9 Scores  PHQ - 2 Score 0 0        Patient Care Team: Purcell Emil Schanz, MD as PCP - General (Internal Medicine) Vincente Grip, MD as Consulting Physician (Psychiatry) Sheldon Standing, MD as Consulting Physician (General Surgery) Shlomo Wilbert SAUNDERS, MD as Consulting Physician (Cardiology) Pyrtle, Gordy HERO, MD as Consulting Physician (Gastroenterology) Patel, Donika K, DO as Consulting Physician (Neurology)   Outpatient Medications Prior to Visit  Medication Sig  . azithromycin  (ZITHROMAX ) 250 MG tablet Sig as indicated  . clindamycin -benzoyl peroxide (BENZACLIN) gel Apply topically 2 (two) times daily.  . clotrimazole -betamethasone  (LOTRISONE ) cream Apply twice daily to feet as needed for flares of rash  . diclofenac  Sodium (VOLTAREN ) 1 % GEL Apply 2 g topically 4 (four) times daily.  . Efinaconazole  (JUBLIA ) 10 % SOLN Apply every night at bedtime to toenails.  SABRA escitalopram (LEXAPRO) 10 MG tablet Take 10 mg by mouth daily.  . gabapentin  (NEURONTIN ) 300 MG capsule Take 1 tablet x 1 week, then increase 2 tablet at bedtime.  . griseofulvin  (GRIS-PEG ) 250 MG tablet Take 1 tablet (250 mg total) by mouth daily.  . mirtazapine (REMERON)  45 MG tablet   . Multiple Vitamin (MULTIVITAMIN) tablet Take 1 tablet by mouth daily.  . nystatin -triamcinolone  ointment (MYCOLOG) Apply topically 2 (two) times daily.  . Prucalopride Succinate  (MOTEGRITY ) 2 MG TABS Take 1 tablet (2 mg total) by mouth daily.  . tacrolimus  (PROTOPIC ) 0.1 % ointment Apply topically 2 (two) times daily.  . Tenapanor HCl (IBSRELA ) 50 MG TABS Take 1 tablet by mouth in the morning and at bedtime.  . terbinafine  (LAMISIL ) 250 MG tablet Take 1 tablet (250 mg total) by mouth daily.  . Travoprost (TRAVATAN OP) Apply to eye at bedtime.  . Travoprost, BAK Free, (TRAVATAN) 0.004 % SOLN ophthalmic solution   . [DISCONTINUED] atorvastatin  (LIPITOR) 20 MG tablet TAKE 1 TABLET BY MOUTH DAILY  . [DISCONTINUED] benzonatate  (TESSALON ) 200 MG capsule Take 1 capsule (200 mg total) by mouth 2 (two) times daily as needed for cough.  . [DISCONTINUED] fluticasone  (FLONASE ) 50 MCG/ACT nasal spray Place 1 spray into both nostrils 2 (two) times daily.  . [DISCONTINUED] ibuprofen  (ADVIL ) 600 MG tablet Take 1 tablet (600 mg total) by mouth every 8 (eight) hours as needed.  . [DISCONTINUED] omeprazole  (PRILOSEC) 40 MG capsule TAKE 1 CAPSULE BY MOUTH DAILY  . [DISCONTINUED] potassium chloride  (KLOR-CON  M) 10 MEQ tablet TAKE 1 TABLET BY MOUTH DAILY  . [DISCONTINUED] sildenafil  (VIAGRA ) 100 MG tablet Take 0.5-1 tablets (50-100 mg total) by mouth daily  as needed for erectile dysfunction.   No facility-administered medications prior to visit.    Review of Systems  All other systems reviewed and are negative.         Objective:     BP 114/88   Pulse 77   Temp 97.9 F (36.6 C) (Temporal)   Ht 5' 5 (1.651 m)   Wt 169 lb (76.7 kg)   SpO2 98%   BMI 28.12 kg/m    Physical Exam Vitals and nursing note reviewed.  Constitutional:      Appearance: Normal appearance. He is normal weight.  HENT:     Head: Normocephalic and atraumatic.     Right Ear: Tympanic membrane, ear canal and  external ear normal.     Left Ear: Tympanic membrane, ear canal and external ear normal.     Nose: Nose normal.     Mouth/Throat:     Mouth: Mucous membranes are moist.     Pharynx: Oropharynx is clear.  Eyes:     Extraocular Movements: Extraocular movements intact.     Conjunctiva/sclera: Conjunctivae normal.     Pupils: Pupils are equal, round, and reactive to light.  Cardiovascular:     Rate and Rhythm: Normal rate and regular rhythm.     Pulses: Normal pulses.     Heart sounds: Normal heart sounds.  Pulmonary:     Effort: Pulmonary effort is normal.     Breath sounds: Normal breath sounds.  Abdominal:     General: Abdomen is flat. Bowel sounds are normal.     Palpations: Abdomen is soft.  Genitourinary:    Penis: Normal.   Musculoskeletal:        General: Normal range of motion.     Cervical back: Normal range of motion and neck supple.  Skin:    General: Skin is warm and dry.  Neurological:     General: No focal deficit present.     Mental Status: He is alert and oriented to person, place, and time. Mental status is at baseline.  Psychiatric:        Mood and Affect: Mood normal.        Behavior: Behavior normal.        Thought Content: Thought content normal.        Judgment: Judgment normal.     Results for orders placed or performed in visit on 06/16/24  Lipid panel  Result Value Ref Range   Cholesterol 186 0 - 200 mg/dL   Triglycerides 847.9 (H) 0.0 - 149.0 mg/dL   HDL 22.59 >60.99 mg/dL   VLDL 69.5 0.0 - 59.9 mg/dL   LDL Cholesterol 78 0 - 99 mg/dL   Total CHOL/HDL Ratio 2    NonHDL 108.86   CBC w/Diff  Result Value Ref Range   WBC 4.7 4.0 - 10.5 K/uL   RBC 4.89 4.22 - 5.81 Mil/uL   Hemoglobin 13.8 13.0 - 17.0 g/dL   HCT 57.8 60.9 - 47.9 %   MCV 86.0 78.0 - 100.0 fl   MCHC 32.9 30.0 - 36.0 g/dL   RDW 86.0 88.4 - 84.4 %   Platelets 256.0 150.0 - 400.0 K/uL   Neutrophils Relative % 30.8 (L) 43.0 - 77.0 %   Lymphocytes Relative 61.0 Repeated and  verified X2. (H) 12.0 - 46.0 %   Monocytes Relative 6.7 3.0 - 12.0 %   Eosinophils Relative 1.0 0.0 - 5.0 %   Basophils Relative 0.5 0.0 - 3.0 %   Neutro Abs 1.5  1.4 - 7.7 K/uL   Lymphs Abs 2.9 0.7 - 4.0 K/uL   Monocytes Absolute 0.3 0.1 - 1.0 K/uL   Eosinophils Absolute 0.0 0.0 - 0.7 K/uL   Basophils Absolute 0.0 0.0 - 0.1 K/uL  TSH  Result Value Ref Range   TSH 1.75 0.35 - 5.50 uIU/mL  PSA  Result Value Ref Range   PSA 0.44 0.10 - 4.00 ng/mL  Comp Met (CMET)  Result Value Ref Range   Sodium 139 135 - 145 mEq/L   Potassium 4.6 3.5 - 5.1 mEq/L   Chloride 101 96 - 112 mEq/L   CO2 30 19 - 32 mEq/L   Glucose, Bld 90 70 - 99 mg/dL   BUN 8 6 - 23 mg/dL   Creatinine, Ser 9.09 0.40 - 1.50 mg/dL   Total Bilirubin 0.6 0.2 - 1.2 mg/dL   Alkaline Phosphatase 109 39 - 117 U/L   AST 31 0 - 37 U/L   ALT 37 0 - 53 U/L   Total Protein 7.2 6.0 - 8.3 g/dL   Albumin 4.5 3.5 - 5.2 g/dL   GFR 07.45 >39.99 mL/min   Calcium  9.3 8.4 - 10.5 mg/dL  HgB J8r  Result Value Ref Range   Hgb A1c MFr Bld 6.7 (H) 4.6 - 6.5 %       Assessment & Plan:    Routine Health Maintenance and Physical Exam  Immunization History  Administered Date(s) Administered  . Hepatitis B, ADULT 01/15/2015, 02/21/2015, 07/30/2015  . Influenza Split 07/21/2013, 07/06/2014  . Influenza Whole 07/27/2008  . Influenza,inj,Quad PF,6+ Mos 07/18/2019  . Influenza,inj,Quad PF,6-35 Mos 07/25/2015  . Influenza-Unspecified 07/17/2016, 07/30/2017, 07/10/2018, 06/01/2020, 06/27/2022, 07/11/2022, 07/31/2023  . Meningococcal polysaccharide vaccine (MPSV4) 09/03/2009  . Td 09/03/2009  . Tdap 04/16/2020  . Zoster Recombinant(Shingrix) 08/10/2021, 10/23/2021    Health Maintenance  Topic Date Due  . Pneumococcal Vaccine: 50+ Years (1 of 2 - PCV) Never done  . COVID-19 Vaccine (1 - 2024-25 season) Never done  . INFLUENZA VACCINE  05/27/2024  . Colonoscopy  08/09/2028  . DTaP/Tdap/Td (3 - Td or Tdap) 04/16/2030  . Hepatitis B  Vaccines 19-59 Average Risk  Completed  . Hepatitis C Screening  Completed  . HIV Screening  Completed  . Zoster Vaccines- Shingrix  Completed  . HPV VACCINES  Aged Out  . Meningococcal B Vaccine  Aged Out    Discussed health benefits of physical activity, and encouraged him to engage in regular exercise appropriate for his age and condition.  Problem List Items Addressed This Visit     THYROID  NODULE   Relevant Orders   CBC w/Diff (Completed)   TSH (Completed)   Prediabetes   Relevant Orders   Comp Met (CMET) (Completed)   HgB A1c (Completed)   Neuropathy   Relevant Medications   ibuprofen  (ADVIL ) 600 MG tablet   HYPERCHOLESTEROLEMIA   Relevant Medications   atorvastatin  (LIPITOR) 20 MG tablet   sildenafil  (VIAGRA ) 100 MG tablet   Other Relevant Orders   Lipid panel (Completed)   Comp Met (CMET) (Completed)   BIPOLAR DISORDER UNSPECIFIED   Relevant Orders   CBC w/Diff (Completed)   Acute cough   Relevant Medications   benzonatate  (TESSALON ) 200 MG capsule   Other Visit Diagnoses       Preventative health care    -  Primary   Relevant Orders   CBC w/Diff (Completed)   PSA (Completed)   Comp Met (CMET) (Completed)     Hypokalemia  Relevant Medications   potassium chloride  (KLOR-CON  M) 10 MEQ tablet   Other Relevant Orders   Comp Met (CMET) (Completed)     Gastroesophageal reflux disease without esophagitis       Relevant Medications   omeprazole  (PRILOSEC) 40 MG capsule      No follow-ups on file.     Junice Fei B Mariell Nester, FNP

## 2024-06-22 ENCOUNTER — Telehealth: Payer: Self-pay

## 2024-06-22 ENCOUNTER — Other Ambulatory Visit (HOSPITAL_COMMUNITY): Payer: Self-pay

## 2024-06-22 NOTE — Telephone Encounter (Signed)
 Pharmacy Patient Advocate Encounter   Received notification from CoverMyMeds that prior authorization for Ibsrela  50MG  tablets is required/requested.   Insurance verification completed.   The patient is insured through [facility] .   Per test claim: PA required; PA submitted to above mentioned insurance via Latent Key/confirmation #/EOC [facility] Status is pending

## 2024-06-23 NOTE — Telephone Encounter (Signed)
 Pharmacy Patient Advocate Encounter  Received notification from [employer] that Prior Authorization for Ibsrela  50MG  tablets has been DENIED.  Full denial letter will be uploaded to the media tab. See denial reason below.  Per your health plan's criteria, this drug is covered if you meet the following:  (1) There are paid claims or your doctor provides medical records (for example: chart notes) showing that you have tried (minimum 30 days supply) or cannot take one of the following: Generic lactulose or generic polyethylene glycol. (2) There are paid claims or your doctor provides medical records (for example: chart notes) showing that you have tried (minimum 30 days supply) or cannot take both of the following: Linzess  and generic lubiprostone  (Amitiza )  PA #/Case ID/Reference #: [id number]

## 2024-06-23 NOTE — Telephone Encounter (Signed)
 See note from Feb 2024 stating he has tried the medications that they are saying he is not tried including MiraLAX, lactulose, Linzess  and Amitiza  Will this suffice for documentation for Ibsrela  approval?

## 2024-06-24 ENCOUNTER — Telehealth: Payer: Self-pay | Admitting: Pharmacist

## 2024-06-24 ENCOUNTER — Other Ambulatory Visit: Payer: Self-pay | Admitting: Emergency Medicine

## 2024-06-24 ENCOUNTER — Other Ambulatory Visit: Payer: Self-pay | Admitting: Internal Medicine

## 2024-06-24 DIAGNOSIS — K219 Gastro-esophageal reflux disease without esophagitis: Secondary | ICD-10-CM

## 2024-06-24 NOTE — Telephone Encounter (Signed)
 Pharmacy Patient Advocate Encounter  Information has been sent to clinical pharmacist for appeals review. It may take 5-7 days to prepare the necessary documentation to request the appeal from the insurance.

## 2024-06-24 NOTE — Telephone Encounter (Signed)
 An E-Appeal has been submitted for Ibsrela . Will advise when response is received, please be advised that most companies may take 30 days to make a decision. Appeal letter and supporting documentation have been uploaded and submitted via CMM website on 06/24/2024.  Thank you, Devere Pandy, PharmD Clinical Pharmacist  [place]  Direct Dial: [phone]

## 2024-06-28 ENCOUNTER — Ambulatory Visit: Payer: Self-pay

## 2024-06-28 NOTE — Telephone Encounter (Signed)
 Pharmacy Patient Advocate Encounter  Received notification from [employer] that Prior Authorization for Ibsrela  50MG  tab has been APPROVED from 06-24-2024 to 09-24-2024   PA #/Case ID/Reference #: [id number]

## 2024-06-28 NOTE — Telephone Encounter (Signed)
 FYI Only or Action Required?: FYI only for provider.  Patient was last seen in primary care on 06/16/2024 by Douglass Kenney NOVAK, FNP.  Called Nurse Triage reporting Lab Results Questions.  Symptoms began N/a .  Interventions attempted: Other: N/a.  Symptoms are: N/a.  Triage Disposition: Information or Advice Only Call  Patient/caregiver understands and will follow disposition?: Yes    Copied from CRM #[id number]. Topic: Clinical - Lab/Test Results >> Jun 28, 2024 10:47 AM Viola F wrote: Reason for CRM: patient returning call regarding lab results, relayed results to patient but he has question on Triglycerides Reason for Disposition  Health information question, no triage required and triager able to answer question  Answer Assessment - Initial Assessment Questions Patient calling in for questions regarding Triglycerides, was unsure what that was and what the result meant. Educated patient on what Triglycerides are, and relayed notes from provider in chart regarding the lab results.   1. REASON FOR CALL: What is the main reason for your call? or How can I best help you?     Lab results questions  2. SYMPTOMS : Do you have any symptoms?      No  Protocols used: Information Only Call - No Triage-A-AH

## 2024-06-28 NOTE — Telephone Encounter (Signed)
 Left detailed message informing patient that his appeal for [place]ona has been approved. Informed him to contact our office with any questions or if this medication is still too expensive.

## 2024-06-29 NOTE — Telephone Encounter (Signed)
 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 [name] states she is helping get the patient's medication. Patient is aware that the medication is approved and waiting on the medication to be filled at his pharmacy.

## 2024-06-29 NOTE — Telephone Encounter (Signed)
 Inbound call from Rheba at [employer] requesting a call from point of contact for Dr. Pamula patients. Requesting to speak in regards to appeal. Call back number is [phone]. Please advise, thank you

## 2024-07-01 NOTE — Telephone Encounter (Signed)
 Returned patients call. LVM for patient to call back regarding his questions about labs

## 2024-07-03 ENCOUNTER — Other Ambulatory Visit: Payer: Self-pay | Admitting: Internal Medicine

## 2024-08-01 ENCOUNTER — Ambulatory Visit: Admitting: Emergency Medicine

## 2024-08-29 ENCOUNTER — Encounter: Payer: Self-pay | Admitting: Radiology

## 2024-08-30 ENCOUNTER — Encounter: Payer: Managed Care, Other (non HMO) | Admitting: Emergency Medicine

## 2024-09-02 ENCOUNTER — Telehealth: Payer: Self-pay

## 2024-09-02 NOTE — Telephone Encounter (Signed)
 Pharmacy Patient Advocate Encounter   Received notification from CoverMyMeds that prior authorization for Ibsrela  50MG  tablets is required/requested.   Insurance verification completed.   The patient is insured through [facility].   Per test claim: PA required; PA submitted to above mentioned insurance via Latent Key/confirmation #/EOC [id number] Status is pending

## 2024-09-05 ENCOUNTER — Telehealth: Payer: Self-pay | Admitting: Pharmacist

## 2024-09-05 NOTE — Telephone Encounter (Signed)
 Information has been sent to clinical pharmacist for appeals review. It may take 5-7 days to prepare the necessary documentation to request the appeal from the insurance.

## 2024-09-05 NOTE — Telephone Encounter (Signed)
 E-Appeal has been submitted. Will advise when response is received or follow up in 1 week. Please be advised that most companies may take 30 days to make a decision. Appeal letter and supporting documentation have been uploaded and submitted via CMM website.   Thank you, Devere Pandy, PharmD Clinical Pharmacist  [place]  Direct Dial: [phone]

## 2024-09-05 NOTE — Telephone Encounter (Signed)
 Please see office notes from 10/13/16,12/29/16, and 07/06/17 showing that patient has met requirements for insurance company to approve Ibsrella. Also see previous phone note from 11/2022 stating patient has tried all of these medications.

## 2024-09-05 NOTE — Telephone Encounter (Signed)
 Pharmacy Patient Advocate Encounter  Received notification from [employer] that Prior Authorization for Ibsrela  50MG  tablets has been DENIED.  Full denial letter will be uploaded to the media tab. See denial reason below.  Per your health plan's criteria, this drug is covered if you meet the following:  (1) There are paid claims or your doctor provides medical records (for example: chart notes) showing that you have tried (minimum 30 days supply) or cannot take one of the following: Generic lactulose or generic polyethylene gylcol (2) There are paid claims or your doctor provides medical records (for example: chart notes) showing that you have tried (minimum 30 day supply)  or cannot take BOTH of the following: Linzess  and generic lubiprostone  (Amitiza )  **Please note: The drug(s) listed above may require additional review  PA #/Case ID/Reference #: [id number]

## 2024-09-06 NOTE — Telephone Encounter (Signed)
 Please see office note from me on 05/12/2023 which documents that he has in fact tried and failed MiraLAX and lactulose JMP

## 2024-09-08 NOTE — Telephone Encounter (Signed)
 Pharmacy Patient Advocate Encounter  Received notification from [employer] that Prior Authorization for Ibsrela  50MG  tablets has been APPROVED from 09-07-2024 to 12-08-2024   PA #/Case ID/Reference #: [id number]

## 2024-09-22 ENCOUNTER — Other Ambulatory Visit: Payer: Self-pay | Admitting: Family

## 2024-09-22 DIAGNOSIS — K219 Gastro-esophageal reflux disease without esophagitis: Secondary | ICD-10-CM

## 2024-09-27 ENCOUNTER — Telehealth: Payer: Self-pay | Admitting: Internal Medicine

## 2024-09-27 DIAGNOSIS — R197 Diarrhea, unspecified: Secondary | ICD-10-CM

## 2024-09-27 NOTE — Telephone Encounter (Signed)
 PT is calling to report that the Isbrela medication is giving him diarrhea along with bloating. Please advise of how to make this better

## 2024-09-28 NOTE — Telephone Encounter (Signed)
 Left message for pt to call back

## 2024-10-03 ENCOUNTER — Ambulatory Visit: Admitting: Dermatology

## 2024-10-03 NOTE — Telephone Encounter (Signed)
 Left message to call back.

## 2024-10-05 NOTE — Telephone Encounter (Signed)
 Following 2 failed attempts to reach patient by phone for return call, I have sent a mychart message for patient review.

## 2024-10-10 NOTE — Telephone Encounter (Signed)
 Inbound call from patient requesting to speak to the nurse. Patient is requesting a call back. Please advise.

## 2024-10-10 NOTE — Telephone Encounter (Signed)
 Patient states that he has been taking ibsrela  for several months now at twice daily dosing but it is causing diarrhea and intense bloating with some lower abdominal discomfort intermittently. He state that symptoms were pretty significant last week though they have improved somewhat since he self-reduced the medication to once daily dosing. He says maybe it will just work out now that he has reduced the medication, however, he does want to make you aware of his symptoms for any additional recommendations you may have.

## 2024-10-11 NOTE — Telephone Encounter (Signed)
 Left message for patient to call back

## 2024-10-12 NOTE — Telephone Encounter (Signed)
 Left message for patient to call back

## 2024-10-13 NOTE — Telephone Encounter (Signed)
 Left a 3rd message for patient to call back. We will await further response from patient before attempting to reach him again regarding his most recent concerns.

## 2024-10-17 ENCOUNTER — Encounter: Payer: Self-pay | Admitting: Internal Medicine

## 2024-10-17 ENCOUNTER — Ambulatory Visit: Admitting: Internal Medicine

## 2024-10-17 VITALS — BP 120/60 | HR 73 | Ht 65.0 in | Wt 167.0 lb

## 2024-10-17 DIAGNOSIS — E041 Nontoxic single thyroid nodule: Secondary | ICD-10-CM | POA: Diagnosis not present

## 2024-10-17 DIAGNOSIS — R7303 Prediabetes: Secondary | ICD-10-CM | POA: Diagnosis not present

## 2024-10-17 NOTE — Patient Instructions (Addendum)
 Please return to see in 1 year.  Let's check another thyroid  [place].

## 2024-10-17 NOTE — Progress Notes (Signed)
 Patient ID: [name], male   DOB: Jan 04, 1961, 63 y.o.   MRN: [id number]  HPI  [name] is a 11 y.o.-year-old male, returning for follow-up for a right thyroid  nodule.  He previously saw Dr. Kassie, last visit with me 1 year and 7 months ago.  Interim history: No dysphagia, neck pressure, hoarseness.He still feels something stuck in his throat after sitting in a chair for more than 30 min.  This is not happening when laying down or looking down/sideways.  No tremors, palpitations, unintentional weight loss, heat intolerance.  Reviewed history: Patient has a history of stable thyroid  nodules, initially diagnosed in 2008.  On 02/04/2022, he presented to the emergency room with tinnitus.  An x-ray and a CT scan of the temporal area were normal.  A TSH was also normal. He saw ENT for tinnitus and hear loss >> ears irrigated >> hearing loss resolved.  At our first visit from 02/2022, he described the feeling of something being stuck in his throat.  No dysphagia or choking.   Reviewed  previous investigation Thyroid  U/S (05/05/2007): The right lobe is 5.2 x 1.6 x 2.0 cm and the left lobe is 5.3 x 1.2 x 2.1 cm.  The isthmus is 4 mm in thickness.  There is a 1.1 x 0.7 x 0.8 cm solid nodule in the lower pole of the right lobe medially.  Otherwise, no nodules are seen.    IMPRESSION:  Solitary 1.1 cm nodule in the right lobe. Biopsy can be performed as clinically indicated.     Thyroid  FNA (05/18/2007):  THYROID , RIGHT, FINE NEEDLE ASPIRATION (SMEARS, THIN PREP, CELL   BLOCK): CONSISTENT WITH HYPERPLASTIC NODULE, SEE COMMENT.    COMMENT   The specimen shows abundant colloid and a few groups of   follicular epithelial cells. Overall, the cytologic features   favor a benign process. (JK:mw 05/19/07)  ... Thyroid  U/S (04/14/2011): Right thyroid  lobe:  Stable 6.3 cm long X 1.8 cm AP X 2.2 cm wide.  Left thyroid  lobe:  Stable 5.6 cm long X 1.9 cm AP X 2.3 cm wide.  Isthmus:  Stable 5 mm AP  thickness.   Focal nodules:  Stable homogeneous thyroid  echotexture.  Stable dominant right inferior thyroid  isoechoic solid thyroid   nodule measuring 1.6 cm long X 0.9 cm AP X 1.5 cm wide, 5 mm left isthmus and inferior left lobe is 6 mm solid nodules.   No new nodules.   Lymphadenopathy:  None visualized.   IMPRESSION:  Stable thyroid  nodules with no new significant abnormality.  ... Thyroid  U/S (03/15/2018): Parenchymal Echotexture: Normal  Isthmus: 0.3 cm  Right lobe: 5.0 x 1.8 x 2.5 cm  Left lobe: 4.9 x 1.8 x 2.0 cm _________________________________________________________   Questionable pseudo nodule in the right inferior gland is isoechoic to the surrounding thyroid  tissue. This region measures 1.4 x 0.8 x 1.6 cm which is similar to slightly smaller than 1.6 x 0.9 x 1.5 cm as measured in June of 2012. Greater than 5 year stability is consistent with benignity.   IMPRESSION: Confirmed greater than 5 year stability of the nodule versus pseudo nodule in the right inferior gland consistent with a benign process. No further imaging required.     Thyroid  U/S (04/03/2023): Parenchymal Echotexture: Mildly heterogenous Isthmus: Normal in size measuring 0.2 cm in diameter, unchanged Right lobe: Normal in size measuring 4.9 x 1.9 x 1.4 cm, previously, 5.0 x 2.5 x 1.8 cm Left lobe: Normal in size measuring 5.2 x 2.1 x  1.2 cm, previously, 4.9 x 2.0 x 1.8 cm   _________________________________________________________   Estimated total number of nodules >/= 1 cm: 1 _________________________________________________________   The 1.6 x 1.6 x 0.8 cm isoechoic nodule/pseudonodule within or adjacent to the inferior pole of the right lobe of the thyroid  is unchanged since at least the 05/2013 examination, previously, 1.6 x 1.6 x 0.9 cm. Imaging stability for greater than 5 years is indicative benign etiology.   IMPRESSION: 1. Mildly heterogeneous but normal-sized thyroid  without  worrisome new or enlarging thyroid  nodule. 2. The 1.6 cm isoechoic nodule/pseudonodule within or adjacent to the inferior pole of the left lobe of the thyroid  is unchanged since at least the 05/2013 examination. Imaging stability for approximately 10 years is indicative of a benign etiology.  He had an EGD an colonoscopy  I reviewed pt's thyroid  tests: Lab Results  Component Value Date   TSH 1.75 06/16/2024   TSH 1.65 08/31/2023   TSH 1.63 03/05/2023   TSH 3.805 02/04/2022   TSH 2.07 01/30/2022   TSH 1.25 11/04/2021   TSH 1.270 08/03/2020   TSH 1.930 07/18/2019   TSH 1.14 07/12/2018   TSH 2.16 06/15/2017   FREET4 0.8 12/13/2007    He was previously found to have thyroiditis changes on the thyroid  uptake and scan (03/01/2003): 24 HOUR UPTAKE WAS PERFORMED AFTER THE PATIENT WAS GIVEN A CAPSULE CONTAINING 14.3 uCi I131.   THE 24 HOUR UPTAKE IS CALCULATED TO BE 0.2%, WITH A SECOND CALCULATION OF 0.6%.  THYROID  SCANNING WAS PERFORMED AFTER THE IV INJECTION OF 11.2 mCi 48mTc04.  THERE IS MINIMAL UPTAKE IN THE GLAND.   No FH of thyroid  ds. No FH of thyroid  cancer. No h/o radiation tx to head or neck.  Prediabetes:  At last visit he reported that sometimes his blood sugars drop too low and he is feeling shaky.  However, he was not checking sugars during these episodes.  At today's visit, he is in the process of changing his diet by reducing bread and concentrated sweets and also fried foods, after an HbA1c returned elevated: Lab Results  Component Value Date   HGBA1C 6.7 (H) 06/16/2024   HGBA1C 6.2 08/31/2023   HGBA1C 6.2 08/25/2022   HGBA1C 6.2 05/09/2022   HGBA1C 6.4 01/30/2022   HGBA1C 6.3 08/19/2021   ROS: + See HPI   Past Medical History:  Diagnosis Date   Allergy    Anxiety    ARM PAIN, LEFT 11/20/2009   Arthritis    BIPOLAR DISORDER UNSPECIFIED 12/13/2007   patient denies   Blood transfusion without reported diagnosis    CHEST PAIN 03/23/2009   currently  having    DEPRESSION 12/13/2007   FATIGUE 03/23/2009   GERD (gastroesophageal reflux disease)    GLAUCOMA 09/03/2009   Glaucoma    Headache    Heart murmur    HEMORRHOIDS 09/03/2009   HOARSENESS 09/03/2009   HYPERCHOLESTEROLEMIA 03/11/2010   Hypertension    MYALGIA 08/13/2009   NECK PAIN 01/18/2009   Palpitations 03/23/2009   Polydipsia 01/24/2008   Shortness of breath 03/23/2009   THYROID  NODULE 08/28/2008   benign on Bx   THYROIDITIS 12/13/2007   Tubular adenoma of colon    Tubular adenoma of colon    Past Surgical History:  Procedure Laterality Date   BACK SURGERY     x 3, lumbar   EYE SURGERY     2008   FINGER SURGERY     SPINE SURGERY     TRANSANAL HEMORRHOIDAL DEARTERIALIZATION  N/A 03/07/2016   Procedure: TRANSANAL HEMORRHOIDAL DEARTERIALIZATION HEMORRHOIDAL LIGATION/PEXY EXAM UNDER ANESTHESIA WITH  POSSIBLE HEMORRHOIDECTOMY ;  Surgeon: Elspeth Schultze, MD;  Location: [facility];  Service: General;  Laterality: N/A;   Social History   Socioeconomic History   Marital status: Divorced    Spouse name: Not on file   Number of children: 1   Years of education: Not on file   Highest education level: GED or equivalent  Occupational History   Occupation: Merchandiser, Retail at [employer]  Tobacco Use   Smoking status: Never    Passive exposure: Never   Smokeless tobacco: Never  Vaping Use   Vaping status: Never Used  Substance and Sexual Activity   Alcohol use: Never   Drug use: Never   Sexual activity: Yes  Other Topics Concern   Not on file  Social History Narrative   Mr Vanaken lives with his wife in [place], works as a human resources officer in a scientist, 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       He is originally form [place]         Are you right handed or left handed? Right Handed    Are you currently employed ? 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 Yes   Who lives with you? No one    What type of home do you live in: 1 story or 2  story? Lives in a one story home       Social Drivers of Health   Tobacco Use: Low Risk (06/19/2024)   Patient History    Smoking Tobacco Use: Never    Smokeless Tobacco Use: Never    Passive Exposure: Never  Financial Resource Strain: Medium Risk (06/14/2024)   Overall Financial Resource Strain (CARDIA)    Difficulty of Paying Living Expenses: Somewhat hard  Food Insecurity: Food Insecurity Present (06/14/2024)   Epic    Worried About Programme Researcher, Broadcasting/film/video in the Last Year: Sometimes true    Ran Out of Food in the Last Year: Never true  Transportation Needs: No Transportation Needs (06/14/2024)   Epic    Lack of Transportation (Medical): No    Lack of Transportation (Non-Medical): No  Physical Activity: Sufficiently Active (06/14/2024)   Exercise Vital Sign    Days of Exercise per Week: 5 days    Minutes of Exercise per Session: 150+ min  Stress: No Stress Concern Present (06/14/2024)   [employer] of Occupational Health - Occupational Stress Questionnaire    Feeling of Stress: Only a little  Social Connections: Moderately Isolated (06/14/2024)   Social Connection and Isolation Panel    Frequency of Communication with Friends and Family: Three times a week    Frequency of Social Gatherings with Friends and Family: Once a week    Attends Religious Services: 1 to 4 times per year    Active Member of [employer] or Organizations: No    Attends Engineer, Structural: Not on file    Marital Status: Divorced  Intimate Partner Violence: Not on file  Depression (PHQ2-9): Low Risk (06/16/2024)   Depression (PHQ2-9)    PHQ-2 Score: 0  Alcohol Screen: Not on file  Housing: Low Risk (06/14/2024)   Epic    Unable to Pay for Housing in the Last Year: No    Number of Times Moved in the Last Year: 0    Homeless in the Last Year: No  Utilities: Not on file  Health Literacy: Not on file   Current Outpatient Medications on File Prior to Visit  Medication Sig Dispense Refill   omeprazole   (PRILOSEC) 40 MG capsule TAKE 1 CAPSULE BY MOUTH DAILY 90 capsule 0   atorvastatin  (LIPITOR) 20 MG tablet Take 1 tablet (20 mg total) by mouth daily. 90 tablet 2   azithromycin  (ZITHROMAX ) 250 MG tablet Sig as indicated 6 tablet 0   benzonatate  (TESSALON ) 200 MG capsule Take 1 capsule (200 mg total) by mouth 2 (two) times daily as needed for cough. 20 capsule 0   clindamycin -benzoyl peroxide (BENZACLIN) gel Apply topically 2 (two) times daily. 25 g 0   clotrimazole -betamethasone  (LOTRISONE ) cream Apply twice daily to feet as needed for flares of rash 30 g 2   diclofenac  Sodium (VOLTAREN ) 1 % GEL Apply 2 g topically 4 (four) times daily. 100 g 5   Efinaconazole  (JUBLIA ) 10 % SOLN Apply every night at bedtime to toenails. 8 mL 11   escitalopram (LEXAPRO) 10 MG tablet Take 10 mg by mouth daily.     fluticasone  (FLONASE ) 50 MCG/ACT nasal spray Place 1 spray into both nostrils 2 (two) times daily. 16 g 6   gabapentin  (NEURONTIN ) 300 MG capsule Take 1 tablet x 1 week, then increase 2 tablet at bedtime. 60 capsule 3   griseofulvin  (GRIS-PEG ) 250 MG tablet Take 1 tablet (250 mg total) by mouth daily. 30 tablet 0   ibuprofen  (ADVIL ) 600 MG tablet Take 1 tablet (600 mg total) by mouth every 8 (eight) hours as needed. 30 tablet 0   mirtazapine (REMERON) 45 MG tablet      Multiple Vitamin (MULTIVITAMIN) tablet Take 1 tablet by mouth daily.     nystatin -triamcinolone  ointment (MYCOLOG) Apply topically 2 (two) times daily. 60 g 3   potassium chloride  (KLOR-CON  M) 10 MEQ tablet Take 1 tablet (10 mEq total) by mouth daily. 30 tablet 2   Prucalopride Succinate  (MOTEGRITY ) 2 MG TABS Take 1 tablet (2 mg total) by mouth daily. 90 tablet 0   sildenafil  (VIAGRA ) 100 MG tablet Take 0.5-1 tablets (50-100 mg total) by mouth daily as needed for erectile dysfunction. 30 tablet 3   tacrolimus  (PROTOPIC ) 0.1 % ointment Apply topically 2 (two) times daily. 60 g 2   Tenapanor HCl (IBSRELA ) 50 MG TABS Take 1 tablet by mouth in  the morning and at bedtime. 60 tablet 11   terbinafine  (LAMISIL ) 250 MG tablet Take 1 tablet (250 mg total) by mouth daily. 14 tablet 0   Travoprost (TRAVATAN OP) Apply to eye at bedtime.     Travoprost, BAK Free, (TRAVATAN) 0.004 % SOLN ophthalmic solution      No current facility-administered medications on file prior to visit.   No Known Allergies Family History  Problem Relation Age of Onset   Dementia Mother    Cancer Father 47       colon   Colon cancer Father    Hypertension Father    Colon cancer Sister    Hypertension Other    Esophageal cancer Neg Hx    Rectal cancer Neg Hx    Stomach cancer Neg Hx    PE: BP 120/60   Pulse 73   Ht 5' 5 (1.651 m)   Wt 167 lb (75.8 kg)   SpO2 95%   BMI 27.79 kg/m  Wt Readings from Last 3 Encounters:  10/17/24 167 lb (75.8 kg)  06/16/24 169 lb (76.7 kg)  06/15/24 169 lb 4 oz (76.8 kg)   Constitutional: overweight,  in NAD Eyes:  EOMI, no exophthalmos ENT: no neck masses, no cervical lymphadenopathy Cardiovascular: RRR, No MRG Respiratory: CTA B Musculoskeletal: no deformities Skin:no rashes Neurological: no tremor with outstretched hands  ASSESSMENT: 1. R Thyroid  nodule  2. Prediabetes  PLAN: 1. Thyroid  nodule - Patient with a history of a 1.4-1.6 cm right thyroid  nodule without concerning ultrasound features including:  - Hypoechogenicity - Presence of microcalcifications - Presence of internal blood flow - Taller than wide distribution - Irregular contours - No family history of thyroid  cancer or personal history of radiation therapy to head or neck to increase his own risk factor for cancer. -His right thyroid  nodule was initially identified in 2008 and he had a benign biopsy at that time.  The nodule appeared approximately stable over many years, between 2008 and 2024.  No further imaging investigation was recommended at the time of his 2024 ultrasound. - At today's visit, he continues to describe foreign body  sensation in his neck.  This appears intermittently, and mostly when he sits in his chair to work on his computer.  This does not appear when lying down or turning head.  No dysphagia or hoarseness.  We discussed that this nodule was quite small and it is not close to the esophagus, being in the right lobe, however, this is bothersome for him so for now we will go ahead and get another ultrasound and if the nodule remains at the same size or larger, we discussed about possibly sending him for RFA.  I explained how this is done and why.  He agrees with this.  He will change his insurance after the first of the year to Blue [employer] Blue Shield some hoping that this will be covered for him. -At last visit, his TSH was normal and he had another TSH obtained in 05/2024 which remains normal.  Will not repeat this today. - I plan to see him back in a year.  2.  Prediabetes - Latest HbA1c reviewed from 05/2024 and this is slightly higher, but now in the diabetic range, at 6.7%.  We - Last at our previous visit about improving his meals by reducing the glycemic index of foods, eating healthy fats with meals and not drinking within 30 minutes from a meal as he was mentioning occasional hypoglycemic episodes.  At last visit I recommended obtaining glucometer from [facility] ([employer]) checking whenever he feels hypoglycemic. -At today's visit, he is working on his diet, as advised by PCP after the above results returned.  We did discuss that he absolutely needs to work on his diet to avoid the diagnosis of diabetes.  As of now he only has 1 elevated HbA1c so he is still in the prediabetes category  Orders Placed This Encounter  Procedures   US  THYROID    Lela Fendt, MD PhD [facility] ([facility] Endocrinology

## 2024-10-24 NOTE — Telephone Encounter (Signed)
 Patient returning call Requesting a call back  Please advise  Thank you

## 2024-10-26 ENCOUNTER — Encounter: Payer: Self-pay | Admitting: Emergency Medicine

## 2024-10-26 ENCOUNTER — Ambulatory Visit: Admitting: Emergency Medicine

## 2024-10-26 VITALS — BP 122/80 | HR 72 | Temp 97.9°F | Ht 65.0 in | Wt 170.0 lb

## 2024-10-26 DIAGNOSIS — R6889 Other general symptoms and signs: Secondary | ICD-10-CM | POA: Diagnosis not present

## 2024-10-26 DIAGNOSIS — J22 Unspecified acute lower respiratory infection: Secondary | ICD-10-CM | POA: Diagnosis not present

## 2024-10-26 DIAGNOSIS — R051 Acute cough: Secondary | ICD-10-CM

## 2024-10-26 LAB — POCT INFLUENZA A/B
Influenza A, POC: NEGATIVE
Influenza B, POC: NEGATIVE

## 2024-10-26 LAB — POC COVID19 BINAXNOW: SARS Coronavirus 2 Ag: NEGATIVE

## 2024-10-26 MED ORDER — BENZONATATE 200 MG PO CAPS: 200.0000 mg | ORAL_CAPSULE | Freq: Two times a day (BID) | ORAL | 0 refills | Status: AC | PRN

## 2024-10-26 MED ORDER — AZITHROMYCIN 250 MG PO TABS: ORAL_TABLET | ORAL | 0 refills | Status: AC

## 2024-10-26 MED ORDER — IBUPROFEN 600 MG PO TABS: 600.0000 mg | ORAL_TABLET | Freq: Three times a day (TID) | ORAL | 0 refills | Status: AC | PRN

## 2024-10-26 NOTE — Progress Notes (Signed)
 [name] 63 y.o.   Chief Complaint  Patient presents with   Cough    Pt states that he has been having a cough and headache for about 4 days. Soemtimes he coughs of phlegm and now drainage in the nose     HISTORY OF PRESENT ILLNESS: Acute problem visit today This is a 63 y.o. male complaining of flu like symptoms started about 3 days ago Mostly complaining of chest congestion and cough No other associated symptoms No other complaints or medical concerns today.  Cough Associated symptoms include a fever and myalgias. Pertinent negatives include no chest pain, headaches or sore throat.     Prior to Admission medications  Medication Sig Start Date End Date Taking? Authorizing Provider  atorvastatin  (LIPITOR) 20 MG tablet Take 1 tablet (20 mg total) by mouth daily. 06/16/24  Yes Webb, Padonda B, FNP  azithromycin  (ZITHROMAX ) 250 MG tablet Sig as indicated 12/02/23  Yes [name], Emil Schanz, MD  benzonatate  (TESSALON ) 200 MG capsule Take 1 capsule (200 mg total) by mouth 2 (two) times daily as needed for cough. 06/16/24  Yes Webb, Padonda B, FNP  clindamycin -benzoyl peroxide (BENZACLIN) gel Apply topically 2 (two) times daily. 05/28/20  Yes Kip Ade, NP  clotrimazole -betamethasone  (LOTRISONE ) cream Apply twice daily to feet as needed for flares of rash 03/30/23  Yes Alm Delon SAILOR, DO  diclofenac  Sodium (VOLTAREN ) 1 % GEL Apply 2 g topically 4 (four) times daily. 08/03/20  Yes Melonie Colonel, Irma M, MD  Efinaconazole  (JUBLIA ) 10 % SOLN Apply every night at bedtime to toenails. 09/28/23  Yes Alm Delon SAILOR, DO  escitalopram (LEXAPRO) 10 MG tablet Take 10 mg by mouth daily.   Yes [provider]  fluticasone  (FLONASE ) 50 MCG/ACT nasal spray Place 1 spray into both nostrils 2 (two) times daily. 06/16/24  Yes Webb, Padonda B, FNP  gabapentin  (NEURONTIN ) 300 MG capsule Take 1 tablet x 1 week, then increase 2 tablet at bedtime. 11/11/22  Yes Patel, Donika K, DO  griseofulvin   (GRIS-PEG ) 250 MG tablet Take 1 tablet (250 mg total) by mouth daily. 02/19/23  Yes Alm Delon SAILOR, DO  ibuprofen  (ADVIL ) 600 MG tablet Take 1 tablet (600 mg total) by mouth every 8 (eight) hours as needed. 06/16/24  Yes Webb, Padonda B, FNP  mirtazapine (REMERON) 45 MG tablet  06/02/17  Yes [provider]  Multiple Vitamin (MULTIVITAMIN) tablet Take 1 tablet by mouth daily.   Yes [provider]  nystatin -triamcinolone  ointment (MYCOLOG) Apply topically 2 (two) times daily. 03/29/24  Yes Alm Delon SAILOR, DO  omeprazole  (PRILOSEC) 40 MG capsule TAKE 1 CAPSULE BY MOUTH DAILY 09/30/24  Yes Teia Freitas, Emil Schanz, MD  potassium chloride  (KLOR-CON  M) 10 MEQ tablet Take 1 tablet (10 mEq total) by mouth daily. 06/16/24  Yes Webb, Padonda B, FNP  Prucalopride Succinate  (MOTEGRITY ) 2 MG TABS Take 1 tablet (2 mg total) by mouth daily. 03/28/24  Yes Pyrtle, Gordy HERO, MD  sildenafil  (VIAGRA ) 100 MG tablet Take 0.5-1 tablets (50-100 mg total) by mouth daily as needed for erectile dysfunction. 06/16/24  Yes Webb, Padonda B, FNP  tacrolimus  (PROTOPIC ) 0.1 % ointment Apply topically 2 (two) times daily. 03/29/24  Yes Alm Delon SAILOR, DO  Tenapanor HCl (IBSRELA ) 50 MG TABS Take 1 tablet by mouth in the morning and at bedtime. 06/15/24  Yes Pyrtle, Gordy HERO, MD  terbinafine  (LAMISIL ) 250 MG tablet Take 1 tablet (250 mg total) by mouth daily. 03/29/24  Yes Alm Delon SAILOR, DO  Travoprost (TRAVATAN OP) Apply to eye at bedtime.   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   Spinal  stenosis of lumbar region 09/24/2021   Acute cough 06/15/2017   Depression 08/16/2015   Erectile dysfunction 04/25/2015   Disorder of liver 10/13/2011   HYPERCHOLESTEROLEMIA 03/11/2010   Unspecified glaucoma 09/03/2009   THYROID  NODULE 08/28/2008   BIPOLAR DISORDER UNSPECIFIED 12/13/2007    Past Medical History:  Diagnosis Date   Allergy    Anxiety    ARM PAIN, LEFT 11/20/2009   Arthritis    BIPOLAR DISORDER UNSPECIFIED 12/13/2007   patient denies   Blood transfusion without reported diagnosis    CHEST PAIN 03/23/2009   currently having    DEPRESSION 12/13/2007   FATIGUE 03/23/2009   GERD (gastroesophageal reflux disease)    GLAUCOMA 09/03/2009   Glaucoma    Headache    Heart murmur    HEMORRHOIDS 09/03/2009   HOARSENESS 09/03/2009   HYPERCHOLESTEROLEMIA 03/11/2010   Hypertension    MYALGIA 08/13/2009   NECK PAIN 01/18/2009   Palpitations 03/23/2009   Polydipsia 01/24/2008   Shortness of breath 03/23/2009   THYROID  NODULE 08/28/2008   benign on Bx   THYROIDITIS 12/13/2007   Tubular adenoma of colon    Tubular adenoma of colon     Past Surgical History:  Procedure Laterality Date   BACK SURGERY     x 3, lumbar   EYE SURGERY     2008   FINGER SURGERY     SPINE SURGERY     TRANSANAL HEMORRHOIDAL DEARTERIALIZATION N/A 03/07/2016   Procedure: TRANSANAL HEMORRHOIDAL DEARTERIALIZATION HEMORRHOIDAL LIGATION/PEXY EXAM UNDER ANESTHESIA WITH  POSSIBLE HEMORRHOIDECTOMY ;  Surgeon: Elspeth Schultze, MD;  Location: [facility];  Service: General;  Laterality: N/A;    Social History   Socioeconomic History   Marital status: Divorced    Spouse name: Not on file   Number of children: 1   Years of education: Not on file   Highest education level: GED or equivalent  Occupational History   Occupation: Merchandiser, Retail at [employer]  Tobacco Use   Smoking status: Never    Passive exposure: Never   Smokeless tobacco: Never  Vaping Use   Vaping status: Never Used  Substance and  Sexual Activity   Alcohol use: Never   Drug use: Never   Sexual activity: Yes  Other Topics Concern   Not on file  Social History Narrative   [name] lives with his wife in [place], works as a human resources officer in a scientist, 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       He is originally form [place]         Are you right handed or left handed? Right Handed    Are you currently employed ? 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 Yes   Who lives with you? No one    What type of home do you live in: 1 story or 2 story? Lives in a one story  home       Social Drivers of Health   Tobacco Use: Low Risk (10/17/2024)   Patient History    Smoking Tobacco Use: Never    Smokeless Tobacco Use: Never    Passive Exposure: Never  Financial Resource Strain: Medium Risk (10/25/2024)   Overall Financial Resource Strain (CARDIA)    Difficulty of Paying Living Expenses: Somewhat hard  Food Insecurity: Patient Declined (10/25/2024)   Epic    Worried About Programme Researcher, Broadcasting/film/video in the Last Year: Patient declined    Barista in the Last Year: Patient declined  Transportation Needs: No Transportation Needs (10/25/2024)   Epic    Lack of Transportation (Medical): No    Lack of Transportation (Non-Medical): No  Physical Activity: Sufficiently Active (10/25/2024)   Exercise Vital Sign    Days of Exercise per Week: 5 days    Minutes of Exercise per Session: 150+ min  Stress: No Stress Concern Present (10/25/2024)   [employer] of Occupational Health - Occupational Stress Questionnaire    Feeling of Stress: Only a little  Social Connections: Moderately Isolated (10/25/2024)   Social Connection and Isolation Panel    Frequency of Communication with Friends and Family: More than three times a week    Frequency of Social Gatherings with Friends and Family: Once a week    Attends Religious Services: 1 to 4 times per year    Active Member of [employer] or  Organizations: No    Attends Engineer, Structural: Not on file    Marital Status: Divorced  Intimate Partner Violence: Not on file  Depression (PHQ2-9): Low Risk (10/26/2024)   Depression (PHQ2-9)    PHQ-2 Score: 0  Alcohol Screen: Not on file  Housing: Unknown (10/25/2024)   Epic    Unable to Pay for Housing in the Last Year: Patient declined    Number of Times Moved in the Last Year: Not on file    Homeless in the Last Year: No  Utilities: Not on file  Health Literacy: Not on file    Family History  Problem Relation Age of Onset   Dementia Mother    Cancer Father 53       colon   Colon cancer Father    Hypertension Father    Colon cancer Sister    Hypertension Other    Esophageal cancer Neg Hx    Rectal cancer Neg Hx    Stomach cancer Neg Hx      Review of Systems  Constitutional:  Positive for fever.  HENT:  Positive for congestion. Negative for sore throat.   Respiratory:  Positive for cough.   Cardiovascular: Negative.  Negative for chest pain and palpitations.  Gastrointestinal:  Negative for abdominal pain, diarrhea, nausea and vomiting.  Genitourinary:  Negative for dysuria and hematuria.  Musculoskeletal:  Positive for myalgias.  Neurological: Negative.  Negative for dizziness and headaches.  All other systems reviewed and are negative.   Vitals:   10/26/24 0937  BP: 122/80  Pulse: 72  Temp: 97.9 F (36.6 C)  SpO2: 97%    Physical Exam Vitals reviewed.  Constitutional:      Appearance: Normal appearance.  HENT:     Head: Normocephalic.     Mouth/Throat:     Mouth: Mucous membranes are moist.     Pharynx: Oropharynx is clear.  Eyes:     Extraocular Movements: Extraocular movements intact.     Conjunctiva/sclera: Conjunctivae normal.     Pupils:  Pupils are equal, round, and reactive to light.  Cardiovascular:     Rate and Rhythm: Normal rate and regular rhythm.     Pulses: Normal pulses.     Heart sounds: Normal heart sounds.   Pulmonary:     Effort: Pulmonary effort is normal.     Breath sounds: Normal breath sounds.  Abdominal:     Palpations: Abdomen is soft.     Tenderness: There is no abdominal tenderness.  Musculoskeletal:     Cervical back: No tenderness.  Lymphadenopathy:     Cervical: No cervical adenopathy.  Skin:    General: Skin is warm and dry.     Capillary Refill: Capillary refill takes less than 2 seconds.  Neurological:     General: No focal deficit present.     Mental Status: He is alert and oriented to person, place, and time.  Psychiatric:        Mood and Affect: Mood normal.        Behavior: Behavior normal.    Results for orders placed or performed in visit on 10/26/24 (from the past 24 hours)  POCT Influenza A/B     Status: Normal   Collection Time: 10/26/24 11:22 AM  Result Value Ref Range   Influenza A, POC Negative Negative   Influenza B, POC Negative Negative  POC COVID-19     Status: Normal   Collection Time: 10/26/24 11:23 AM  Result Value Ref Range   SARS Coronavirus 2 Ag Negative Negative     ASSESSMENT & PLAN: Problem List Items Addressed This Visit       Respiratory   Lower respiratory infection - Primary   Clinically stable.  No signs of pneumonia. Negative COVID and flu tests Recommend daily azithromycin  for 5 days Symptom management discussed Advised to rest and stay well-hydrated Recommend over-the-counter Mucinex dm with cough drops Advised to contact the office if no better or worse during the next several days      Relevant Medications   azithromycin  (ZITHROMAX ) 250 MG tablet     Other   Acute cough   Cough management discussed Advised to take over-the-counter Mucinex DM and cough drops Start Tessalon  200 mg 3 times a day Advised to rest and stay well-hydrated      Relevant Medications   benzonatate  (TESSALON ) 200 MG capsule   Flu-like symptoms   Symptom management discussed Advised to rest and stay well-hydrated Medications as  prescribed Contact the office if no better or worse during the next several days.      Relevant Medications   ibuprofen  (ADVIL ) 600 MG tablet   Patient Instructions  Acute Bronchitis, Adult  Acute bronchitis is when air tubes in the lungs (bronchi) suddenly get swollen. The condition can make it hard for you to breathe. In adults, acute bronchitis usually goes away within 2 weeks. A cough caused by bronchitis may last up to 3 weeks. Smoking, allergies, and asthma can make the condition worse. What are the causes? Germs that cause cold and flu (viruses). The most common cause of this condition is the virus that causes the common cold. Bacteria. Substances that bother (irritate) the lungs, including: Smoke from cigarettes and other types of tobacco. Dust and pollen. Fumes from chemicals, gases, or burned fuel. Indoor or outdoor air pollution. What increases the risk? A weak body's defense system. This is also called the immune system. Any condition that affects your lungs and breathing, such as asthma. What are the signs or symptoms? A cough.  Coughing up clear, yellow, or green mucus. Making high-pitched whistling sounds when you breathe, most often when you breathe out (wheezing). Runny or stuffy nose. Having too much mucus in your lungs (chest congestion). Shortness of breath. Body aches. A sore throat. How is this treated? Acute bronchitis may go away over time without treatment. Your doctor may tell you to: Drink more fluids. This will help thin your mucus so it is easier to cough up. Use a device that gets medicine into your lungs (inhaler). Use a vaporizer or a humidifier. These are machines that add water to the air. This helps with coughing and poor breathing. Take a medicine that thins mucus and helps clear it from your lungs. Take a medicine that prevents or stops coughing. It is not common to take an antibiotic medicine for this condition. Follow these instructions at  home:  Take over-the-counter and prescription medicines only as told by your doctor. Use an inhaler, vaporizer, or humidifier as told by your doctor. Take two teaspoons (10 mL) of honey at bedtime. This helps lessen your coughing at night. Drink enough fluid to keep your pee (urine) pale yellow. Do not smoke or use any products that contain nicotine or tobacco. If you need help quitting, ask your doctor. Get a lot of rest. Return to your normal activities when your doctor says that it is safe. Keep all follow-up visits. How is this prevented?  Wash your hands often with soap and water for at least 20 seconds. If you cannot use soap and water, use hand sanitizer. Avoid contact with people who have cold symptoms. Try not to touch your mouth, nose, or eyes with your hands. Avoid breathing in smoke or chemical fumes. Make sure to get the flu shot every year. Contact a doctor if: Your symptoms do not get better in 2 weeks. You have trouble coughing up the mucus. Your cough keeps you awake at night. You have a fever. Get help right away if: You cough up blood. You have chest pain. You have very bad shortness of breath. You faint or keep feeling like you are going to faint. You have a very bad headache. Your fever or chills get worse. These symptoms may be an emergency. Get help right away. Call your local emergency services (911 in the [place] Do not wait to see if the symptoms will go away. Do not drive yourself to the hospital. Summary Acute bronchitis is when air tubes in the lungs (bronchi) suddenly get swollen. In adults, acute bronchitis usually goes away within 2 weeks. Drink more fluids. This will help thin your mucus so it is easier to cough up. Take over-the-counter and prescription medicines only as told by your doctor. Contact a doctor if your symptoms do not improve after 2 weeks of treatment. This information is not intended to replace advice given to you by your health  care provider. Make sure you discuss any questions you have with your health care provider. Document Revised: 02/13/2021 Document Reviewed: 02/13/2021 Elsevier Patient Education  2024 Elsevier Inc.      Emil Schaumann, MD [facility]     [1] No Known Allergies

## 2024-10-26 NOTE — Assessment & Plan Note (Signed)
 Clinically stable.  No signs of pneumonia. Negative COVID and flu tests Recommend daily azithromycin  for 5 days Symptom management discussed Advised to rest and stay well-hydrated Recommend over-the-counter Mucinex dm with cough drops Advised to contact the office if no better or worse during the next several days

## 2024-10-26 NOTE — Assessment & Plan Note (Signed)
 Cough management discussed Advised to take over-the-counter Mucinex DM and cough drops Start Tessalon 200 mg 3 times a day Advised to rest and stay well-hydrated

## 2024-10-26 NOTE — Patient Instructions (Signed)
 Acute Bronchitis, Adult  Acute bronchitis is when air tubes in the lungs (bronchi) suddenly get swollen. The condition can make it hard for you to breathe. In adults, acute bronchitis usually goes away within 2 weeks. A cough caused by bronchitis may last up to 3 weeks. Smoking, allergies, and asthma can make the condition worse. What are the causes? Germs that cause cold and flu (viruses). The most common cause of this condition is the virus that causes the common cold. Bacteria. Substances that bother (irritate) the lungs, including: Smoke from cigarettes and other types of tobacco. Dust and pollen. Fumes from chemicals, gases, or burned fuel. Indoor or outdoor air pollution. What increases the risk? A weak body's defense system. This is also called the immune system. Any condition that affects your lungs and breathing, such as asthma. What are the signs or symptoms? A cough. Coughing up clear, yellow, or green mucus. Making high-pitched whistling sounds when you breathe, most often when you breathe out (wheezing). Runny or stuffy nose. Having too much mucus in your lungs (chest congestion). Shortness of breath. Body aches. A sore throat. How is this treated? Acute bronchitis may go away over time without treatment. Your doctor may tell you to: Drink more fluids. This will help thin your mucus so it is easier to cough up. Use a device that gets medicine into your lungs (inhaler). Use a vaporizer or a humidifier. These are machines that add water to the air. This helps with coughing and poor breathing. Take a medicine that thins mucus and helps clear it from your lungs. Take a medicine that prevents or stops coughing. It is not common to take an antibiotic medicine for this condition. Follow these instructions at home:  Take over-the-counter and prescription medicines only as told by your doctor. Use an inhaler, vaporizer, or humidifier as told by your doctor. Take two teaspoons  (10 mL) of honey at bedtime. This helps lessen your coughing at night. Drink enough fluid to keep your pee (urine) pale yellow. Do not smoke or use any products that contain nicotine or tobacco. If you need help quitting, ask your doctor. Get a lot of rest. Return to your normal activities when your doctor says that it is safe. Keep all follow-up visits. How is this prevented?  Wash your hands often with soap and water for at least 20 seconds. If you cannot use soap and water, use hand sanitizer. Avoid contact with people who have cold symptoms. Try not to touch your mouth, nose, or eyes with your hands. Avoid breathing in smoke or chemical fumes. Make sure to get the flu shot every year. Contact a doctor if: Your symptoms do not get better in 2 weeks. You have trouble coughing up the mucus. Your cough keeps you awake at night. You have a fever. Get help right away if: You cough up blood. You have chest pain. You have very bad shortness of breath. You faint or keep feeling like you are going to faint. You have a very bad headache. Your fever or chills get worse. These symptoms may be an emergency. Get help right away. Call your local emergency services (911 in the [place] Do not wait to see if the symptoms will go away. Do not drive yourself to the hospital. Summary Acute bronchitis is when air tubes in the lungs (bronchi) suddenly get swollen. In adults, acute bronchitis usually goes away within 2 weeks. Drink more fluids. This will help thin your mucus so it is easier  to cough up. Take over-the-counter and prescription medicines only as told by your doctor. Contact a doctor if your symptoms do not improve after 2 weeks of treatment. This information is not intended to replace advice given to you by your health care provider. Make sure you discuss any questions you have with your health care provider. Document Revised: 02/13/2021 Document Reviewed: 02/13/2021 Elsevier Patient  Education  2024 [employer].

## 2024-10-26 NOTE — Assessment & Plan Note (Signed)
 Symptom management discussed Advised to rest and stay well-hydrated Medications as prescribed Contact the office if no better or worse during the next several days.

## 2024-10-28 NOTE — Telephone Encounter (Signed)
"  Left message to call back.   "

## 2024-10-28 NOTE — Telephone Encounter (Signed)
 Spoke with patient. He states that he has been taking the IBSrela  once daily and has still been having some diarrhea. Offered metronidazole  x 7 days for SIBO. Patient does state that he is currently on azithromycin  for a respiratory infection. He has 2 days left of the medication. Would you still offer metronidazole  following the azithromycin  rx?

## 2024-10-31 NOTE — Telephone Encounter (Signed)
 Inbound call from patient stating that he is returning a call back to [facility]. Patient is requesting a call back. Please advise.

## 2024-10-31 NOTE — Telephone Encounter (Signed)
 Attempted to reach patient by phone. No answer. Will send a mychart note to patient instead.

## 2024-11-07 ENCOUNTER — Ambulatory Visit
Admission: RE | Admit: 2024-11-07 | Discharge: 2024-11-07 | Disposition: A | Source: Ambulatory Visit | Attending: Internal Medicine | Admitting: Internal Medicine

## 2024-11-07 DIAGNOSIS — E041 Nontoxic single thyroid nodule: Secondary | ICD-10-CM

## 2024-11-08 ENCOUNTER — Ambulatory Visit: Payer: Self-pay | Admitting: Internal Medicine

## 2024-11-08 ENCOUNTER — Other Ambulatory Visit: Payer: Self-pay | Admitting: Internal Medicine

## 2024-11-08 DIAGNOSIS — K219 Gastro-esophageal reflux disease without esophagitis: Secondary | ICD-10-CM

## 2024-11-08 DIAGNOSIS — E041 Nontoxic single thyroid nodule: Secondary | ICD-10-CM

## 2024-11-09 ENCOUNTER — Other Ambulatory Visit: Payer: Self-pay | Admitting: Dermatology

## 2024-11-09 DIAGNOSIS — B351 Tinea unguium: Secondary | ICD-10-CM

## 2024-11-21 NOTE — Addendum Note (Signed)
 Addended by: CLAUDENE NAOMIE SAILOR on: 11/21/2024 08:09 AM   Modules accepted: Orders

## 2024-11-25 ENCOUNTER — Encounter: Payer: Self-pay | Admitting: Internal Medicine

## 2024-12-09 ENCOUNTER — Ambulatory Visit: Admitting: Nurse Practitioner

## 2024-12-12 ENCOUNTER — Ambulatory Visit: Admitting: Dermatology

## 2025-10-16 ENCOUNTER — Ambulatory Visit: Admitting: Internal Medicine
